# Patient Record
Sex: Male | Born: 1955
Health system: Southern US, Community
[De-identification: ages and names within clinical notes are randomized; demographics above are authoritative.]

## PROBLEM LIST (undated history)

## (undated) DIAGNOSIS — M1990 Unspecified osteoarthritis, unspecified site: Secondary | ICD-10-CM

## (undated) DIAGNOSIS — F329 Major depressive disorder, single episode, unspecified: Secondary | ICD-10-CM

## (undated) DIAGNOSIS — F4310 Post-traumatic stress disorder, unspecified: Secondary | ICD-10-CM

## (undated) DIAGNOSIS — F1910 Other psychoactive substance abuse, uncomplicated: Secondary | ICD-10-CM

## (undated) DIAGNOSIS — D649 Anemia, unspecified: Secondary | ICD-10-CM

## (undated) DIAGNOSIS — IMO0002 Reserved for concepts with insufficient information to code with codable children: Secondary | ICD-10-CM

## (undated) DIAGNOSIS — K219 Gastro-esophageal reflux disease without esophagitis: Secondary | ICD-10-CM

## (undated) DIAGNOSIS — F32A Depression, unspecified: Secondary | ICD-10-CM

## (undated) HISTORY — DX: Post-traumatic stress disorder, unspecified: F43.10

## (undated) HISTORY — PX: SPINE SURGERY: SHX786

## (undated) HISTORY — DX: Reserved for concepts with insufficient information to code with codable children: IMO0002

## (undated) HISTORY — PX: HERNIA REPAIR: SHX51

## (undated) HISTORY — DX: Depression, unspecified: F32.A

## (undated) HISTORY — DX: Unspecified osteoarthritis, unspecified site: M19.90

## (undated) HISTORY — PX: TOTAL HIP ARTHROPLASTY: SHX124

## (undated) HISTORY — DX: Major depressive disorder, single episode, unspecified: F32.9

## (undated) HISTORY — DX: Anemia, unspecified: D64.9

## (undated) HISTORY — DX: Gastro-esophageal reflux disease without esophagitis: K21.9

## (undated) HISTORY — PX: JOINT REPLACEMENT: SHX530

## (undated) HISTORY — DX: Other psychoactive substance abuse, uncomplicated: F19.10

---

## 2010-03-17 DIAGNOSIS — M5136 Other intervertebral disc degeneration, lumbar region: Secondary | ICD-10-CM | POA: Insufficient documentation

## 2011-06-08 DIAGNOSIS — Z96649 Presence of unspecified artificial hip joint: Secondary | ICD-10-CM | POA: Insufficient documentation

## 2011-11-11 DIAGNOSIS — L309 Dermatitis, unspecified: Secondary | ICD-10-CM | POA: Insufficient documentation

## 2013-12-02 DIAGNOSIS — K219 Gastro-esophageal reflux disease without esophagitis: Secondary | ICD-10-CM | POA: Insufficient documentation

## 2014-09-04 DIAGNOSIS — K315 Obstruction of duodenum: Secondary | ICD-10-CM | POA: Insufficient documentation

## 2014-09-04 DIAGNOSIS — K5790 Diverticulosis of intestine, part unspecified, without perforation or abscess without bleeding: Secondary | ICD-10-CM | POA: Insufficient documentation

## 2016-07-06 ENCOUNTER — Ambulatory Visit: Payer: Self-pay | Admitting: Family Medicine

## 2016-07-07 ENCOUNTER — Ambulatory Visit (INDEPENDENT_AMBULATORY_CARE_PROVIDER_SITE_OTHER): Payer: Self-pay | Admitting: Family Medicine

## 2016-07-07 ENCOUNTER — Encounter: Payer: Self-pay | Admitting: Family Medicine

## 2016-07-07 VITALS — BP 109/75 | HR 85 | Temp 97.9°F | Ht 72.0 in | Wt 201.0 lb

## 2016-07-07 DIAGNOSIS — K269 Duodenal ulcer, unspecified as acute or chronic, without hemorrhage or perforation: Secondary | ICD-10-CM | POA: Insufficient documentation

## 2016-07-07 DIAGNOSIS — F339 Major depressive disorder, recurrent, unspecified: Secondary | ICD-10-CM | POA: Insufficient documentation

## 2016-07-07 DIAGNOSIS — F419 Anxiety disorder, unspecified: Secondary | ICD-10-CM

## 2016-07-07 DIAGNOSIS — F329 Major depressive disorder, single episode, unspecified: Secondary | ICD-10-CM

## 2016-07-07 DIAGNOSIS — F4310 Post-traumatic stress disorder, unspecified: Secondary | ICD-10-CM | POA: Insufficient documentation

## 2016-07-07 MED ORDER — SERTRALINE HCL 100 MG PO TABS: 200.0000 mg | ORAL_TABLET | Freq: Every day | ORAL | 1 refills | Status: DC

## 2016-07-07 MED ORDER — OMEPRAZOLE 40 MG PO CPDR: 40.0000 mg | DELAYED_RELEASE_CAPSULE | Freq: Every day | ORAL | 3 refills | Status: DC

## 2016-07-07 MED ORDER — ZIPRASIDONE HCL 40 MG PO CAPS: 40.0000 mg | ORAL_CAPSULE | Freq: Two times a day (BID) | ORAL | 1 refills | Status: DC
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
## 2016-07-07 NOTE — Addendum Note (Signed)
Addended by: Arville Care on: 07/07/2016 02:52 PM   Modules accepted: Level of Service

## 2016-08-04 ENCOUNTER — Encounter: Payer: Self-pay | Admitting: Family Medicine

## 2016-08-04 ENCOUNTER — Ambulatory Visit (INDEPENDENT_AMBULATORY_CARE_PROVIDER_SITE_OTHER): Payer: Self-pay | Admitting: Family Medicine

## 2016-08-04 VITALS — BP 101/72 | HR 72 | Temp 97.8°F | Ht 72.0 in | Wt 201.0 lb

## 2016-08-04 DIAGNOSIS — F329 Major depressive disorder, single episode, unspecified: Secondary | ICD-10-CM

## 2016-08-04 DIAGNOSIS — F419 Anxiety disorder, unspecified: Secondary | ICD-10-CM

## 2016-08-04 DIAGNOSIS — F4310 Post-traumatic stress disorder, unspecified: Secondary | ICD-10-CM

## 2016-08-04 MED ORDER — ZIPRASIDONE HCL 80 MG PO CAPS: 80.0000 mg | ORAL_CAPSULE | Freq: Two times a day (BID) | ORAL | 1 refills | Status: DC

## 2016-08-04 MED ORDER — SERTRALINE HCL 100 MG PO TABS: 200.0000 mg | ORAL_TABLET | Freq: Every day | ORAL | 1 refills | Status: DC

## 2016-08-04 NOTE — Progress Notes (Signed)
BP 101/72   Pulse 72   Temp 97.8 F (36.6 C) (Oral)   Ht 6' (1.829 m)   Wt 201 lb (91.2 kg)   BMI 27.26 kg/m    Subjective:    Patient ID: Nicholas Yates, male    DOB: Feb 17, 1956, 61 y.o.   MRN: [id number]  HPI: Nicholas Yates is a 61 y.o. male presenting on 08/04/2016 for Anxiety (4 week followup)   HPI Anxiety depression and PTSD Patient is coming in for recheck on anxiety and depression and PTSD. Patient has been having a lot of anxiety still. He says the first week, Geodon it worked well and then he feels like it's tapered off for the past 3 weeks. His anxiety builds up to levels were is been having panic attacks. He says it's not necessarily any better or worse than when he was on the Risperdal at this point. He denies any side effects from the medication. He does feel like he is having a lot more movement and shakiness but he associates that with him feeling anxiety and having panic attacks. He denies any movements with his face or mouth. He denies any suicidal ideations or thoughts of hurting himself. He is anxiety has been keeping him up at night as well often.  Relevant past medical, surgical, family and social history reviewed and updated as indicated. Interim medical history since our last visit reviewed. Allergies and medications reviewed and updated.  Review of Systems  Constitutional: Negative for chills and fever.  Eyes: Negative for discharge.  Respiratory: Negative for shortness of breath and wheezing.   Cardiovascular: Negative for chest pain and leg swelling.  Musculoskeletal: Negative for back pain and gait problem.  Skin: Negative for rash.  Psychiatric/Behavioral: Positive for decreased concentration, dysphoric mood and sleep disturbance. Negative for self-injury and suicidal ideas. The patient is nervous/anxious.   All other systems reviewed and are negative.   Per HPI unless specifically indicated above        Objective:    BP 101/72   Pulse 72    Temp 97.8 F (36.6 C) (Oral)   Ht 6' (1.829 m)   Wt 201 lb (91.2 kg)   BMI 27.26 kg/m   Wt Readings from Last 3 Encounters:  08/04/16 201 lb (91.2 kg)  07/07/16 201 lb (91.2 kg)    Physical Exam  Constitutional: He is oriented to person, place, and time. He appears well-developed and well-nourished. No distress.  Eyes: Conjunctivae are normal. No scleral icterus.  Cardiovascular: Normal rate, regular rhythm, normal heart sounds and intact distal pulses.   No murmur heard. Pulmonary/Chest: Effort normal and breath sounds normal. No respiratory distress. He has no wheezes. He has no rales.  Musculoskeletal: Normal range of motion. He exhibits no edema.  Neurological: He is alert and oriented to person, place, and time. Coordination normal.  Skin: Skin is warm and dry. No rash noted. He is not diaphoretic.  Psychiatric: His behavior is normal. Judgment normal. His mood appears anxious. He exhibits a depressed mood. He expresses no suicidal ideation. He expresses no suicidal plans.  Nursing note and vitals reviewed.  No results found for this or any previous visit.    Assessment & Plan:   Problem List Items Addressed This Visit      Other   Anxiety and depression   Relevant Medications   ziprasidone (GEODON) 80 MG capsule   sertraline (ZOLOFT) 100 MG tablet   PTSD (post-traumatic stress disorder) - Primary   Relevant Medications  ziprasidone (GEODON) 80 MG capsule   sertraline (ZOLOFT) 100 MG tablet       Follow up plan: Return in about 4 weeks (around 09/01/2016), or if symptoms worsen or fail to improve, for Recheck anxiety and PTSD.  Counseling provided for all of the vaccine components No orders of the defined types were placed in this encounter.   Arville CareJoshua Pasty Manninen, MD [facility] 08/04/2016, 1:34 PM

## 2016-09-01 ENCOUNTER — Ambulatory Visit (INDEPENDENT_AMBULATORY_CARE_PROVIDER_SITE_OTHER): Payer: Self-pay | Admitting: Family Medicine

## 2016-09-01 ENCOUNTER — Encounter: Payer: Self-pay | Admitting: Family Medicine

## 2016-09-01 VITALS — BP 123/76 | HR 74 | Temp 96.9°F | Ht 72.0 in | Wt 203.0 lb

## 2016-09-01 DIAGNOSIS — F329 Major depressive disorder, single episode, unspecified: Secondary | ICD-10-CM

## 2016-09-01 DIAGNOSIS — F4310 Post-traumatic stress disorder, unspecified: Secondary | ICD-10-CM

## 2016-09-01 DIAGNOSIS — F419 Anxiety disorder, unspecified: Secondary | ICD-10-CM

## 2016-09-01 DIAGNOSIS — F32A Depression, unspecified: Secondary | ICD-10-CM

## 2016-09-01 MED ORDER — VENLAFAXINE HCL ER 150 MG PO CP24: 150.0000 mg | ORAL_CAPSULE | Freq: Every day | ORAL | 1 refills | Status: DC

## 2016-09-01 MED ORDER — HYDROXYZINE PAMOATE 25 MG PO CAPS: 25.0000 mg | ORAL_CAPSULE | Freq: Three times a day (TID) | ORAL | 0 refills | Status: DC | PRN

## 2016-09-01 NOTE — Progress Notes (Signed)
BP 123/76   Pulse 74   Temp (!) 96.9 F (36.1 C) (Oral)   Ht 6' (1.829 m)   Wt 203 lb (92.1 kg)   BMI 27.53 kg/m    Subjective:    Patient ID: Nicholas Yates, male    DOB: [birth year], 61 y.o.   MRN: [id number]  HPI: Nicholas Yates is a 61 y.o. male presenting on 09/01/2016 for Anxiety/PTSD (4 week followup; patient feels that Zoloft is not helping at all)   HPI Anxiety and PTSD recheck Patient is coming in today for anxiety and PTSD recheck. He does not feel like the Zoloft even at the higher dose is helping him. He is currently taking 200 mg of the Zoloft. He denies any major side effects from that but just says it's not helping his PTSD is especially since his living situation has been a little difficult with his current partner. He would like to try something different except the Zoloft before. He denies any suicidal ideations or thoughts of hurting himself. He says the Earnestine Leys is working well for his anxiety just does not feel like it lasts all day and he needs something to go with that. He is starting to sleep better because of the Geodon as well. He is having a lot less nightmares and flashbacks.  Relevant past medical, surgical, family and social history reviewed and updated as indicated. Interim medical history since our last visit reviewed. Allergies and medications reviewed and updated.  Review of Systems  Constitutional: Negative for chills and fever.  Eyes: Negative for discharge.  Respiratory: Negative for shortness of breath and wheezing.   Cardiovascular: Negative for chest pain and leg swelling.  Musculoskeletal: Negative for back pain and gait problem.  Skin: Negative for rash.  Psychiatric/Behavioral: Positive for dysphoric mood and sleep disturbance. Negative for self-injury and suicidal ideas. The patient is nervous/anxious.   All other systems reviewed and are negative.   Per HPI unless specifically indicated above   Allergies as of 09/01/2016   No Known  Allergies     Medication List       Accurate as of 09/01/16  4:51 PM. Always use your most recent med list.          hydrOXYzine 25 MG capsule Commonly known as:  VISTARIL Take 1 capsule (25 mg total) by mouth 3 (three) times daily as needed.   omeprazole 40 MG capsule Commonly known as:  PRILOSEC Take 1 capsule (40 mg total) by mouth daily.   venlafaxine XR 150 MG 24 hr capsule Commonly known as:  EFFEXOR XR Take 1 capsule (150 mg total) by mouth daily with breakfast.   ziprasidone 80 MG capsule Commonly known as:  GEODON Take 1 capsule (80 mg total) by mouth 2 (two) times daily with a meal.          Objective:    BP 123/76   Pulse 74   Temp (!) 96.9 F (36.1 C) (Oral)   Ht 6' (1.829 m)   Wt 203 lb (92.1 kg)   BMI 27.53 kg/m   Wt Readings from Last 3 Encounters:  09/01/16 203 lb (92.1 kg)  08/04/16 201 lb (91.2 kg)  07/07/16 201 lb (91.2 kg)    Physical Exam  Constitutional: He is oriented to person, place, and time. He appears well-developed and well-nourished. No distress.  Eyes: Conjunctivae are normal. No scleral icterus.  Neck: Neck supple. No thyromegaly present.  Cardiovascular: Normal rate, regular rhythm, normal heart sounds and intact distal  pulses.   No murmur heard. Pulmonary/Chest: Effort normal and breath sounds normal. No respiratory distress. He has no wheezes.  Musculoskeletal: Normal range of motion. He exhibits no edema.  Lymphadenopathy:    He has no cervical adenopathy.  Neurological: He is alert and oriented to person, place, and time. Coordination normal.  Skin: Skin is warm and dry. No rash noted. He is not diaphoretic.  Psychiatric: His behavior is normal. His mood appears anxious. He exhibits a depressed mood. He expresses no suicidal ideation. He expresses no suicidal plans.  Nursing note and vitals reviewed.   No results found for this or any previous visit.    Assessment & Plan:   Problem List Items Addressed This Visit        Other   Anxiety and depression - Primary   Relevant Medications   venlafaxine XR (EFFEXOR XR) 150 MG 24 hr capsule   hydrOXYzine (VISTARIL) 25 MG capsule   PTSD (post-traumatic stress disorder)   Relevant Medications   venlafaxine XR (EFFEXOR XR) 150 MG 24 hr capsule   hydrOXYzine (VISTARIL) 25 MG capsule       Follow up plan: Return if symptoms worsen or fail to improve.  Counseling provided for all of the vaccine components No orders of the defined types were placed in this encounter.   Arville CareJoshua Ladaja Yusupov, MD [facility] 09/01/2016, 4:51 PM

## 2016-09-14 ENCOUNTER — Encounter: Payer: Self-pay | Admitting: *Deleted

## 2016-09-27 ENCOUNTER — Other Ambulatory Visit: Payer: Self-pay | Admitting: Family Medicine

## 2016-09-27 DIAGNOSIS — F4310 Post-traumatic stress disorder, unspecified: Secondary | ICD-10-CM

## 2016-09-27 DIAGNOSIS — F329 Major depressive disorder, single episode, unspecified: Secondary | ICD-10-CM

## 2016-09-27 DIAGNOSIS — F419 Anxiety disorder, unspecified: Principal | ICD-10-CM

## 2016-09-28 NOTE — Telephone Encounter (Signed)
Last seen 09/01/16  Dr Dettinger

## 2016-10-03 ENCOUNTER — Ambulatory Visit: Payer: Self-pay | Admitting: Family Medicine

## 2016-10-05 ENCOUNTER — Ambulatory Visit: Payer: Self-pay | Admitting: Family Medicine

## 2016-10-07 ENCOUNTER — Encounter: Payer: Self-pay | Admitting: Family Medicine

## 2016-10-07 ENCOUNTER — Ambulatory Visit (INDEPENDENT_AMBULATORY_CARE_PROVIDER_SITE_OTHER): Payer: Self-pay | Admitting: Family Medicine

## 2016-10-07 VITALS — BP 120/79 | HR 84 | Temp 98.1°F | Ht 72.0 in | Wt 205.0 lb

## 2016-10-07 DIAGNOSIS — F329 Major depressive disorder, single episode, unspecified: Secondary | ICD-10-CM

## 2016-10-07 DIAGNOSIS — F419 Anxiety disorder, unspecified: Secondary | ICD-10-CM

## 2016-10-07 DIAGNOSIS — F32A Depression, unspecified: Secondary | ICD-10-CM

## 2016-10-07 DIAGNOSIS — F4310 Post-traumatic stress disorder, unspecified: Secondary | ICD-10-CM

## 2016-10-07 MED ORDER — ZIPRASIDONE HCL 80 MG PO CAPS: 80.0000 mg | ORAL_CAPSULE | Freq: Two times a day (BID) | ORAL | 3 refills | Status: DC

## 2016-10-07 MED ORDER — VENLAFAXINE HCL ER 150 MG PO CP24: 150.0000 mg | ORAL_CAPSULE | Freq: Every day | ORAL | 3 refills | Status: DC

## 2016-10-07 NOTE — Assessment & Plan Note (Signed)
Not doing perfect, still drinking a lot of alcohol, recommended to back off and alcohol and reevaluate in 3 months. We'll do slow taper on alcohol.

## 2016-10-07 NOTE — Progress Notes (Signed)
BP 120/79   Pulse 84   Temp 98.1 F (36.7 C) (Oral)   Ht 6' (1.829 m)   Wt 205 lb (93 kg)   BMI 27.80 kg/m    Subjective:    Patient ID: Nicholas FuchsDonald Yates, male    DOB: Jun 12, 1955, 61 y.o.   MRN: [id number]  HPI: Nicholas Yates is a 61 y.o. male presenting on 10/07/2016 for Anxiety (4 week followup)   HPI Anxiety depression and PTSD recheck Patient is coming in today for anxiety and depression and PTSD recheck. Says he is not doing well still has a lot of agitation but his anger has improved since we went up on the Effexor. He says he is still drinking about 10 beers or more a day and I'm more than that is probably impacting whether these medications work or not. He says he still has a lot of issues with PTSD but he is sleeping better at night and having less dreams. He denies any suicidal ideations or thoughts of hurting himself. [facility] 10/07/2016 09/01/2016 08/04/2016 07/07/2016  Decreased Interest 3 3 3  0  Down, Depressed, Hopeless 2 3 3 1   PHQ - 2 Score 5 6 6 1   Altered sleeping 3 3 3  -  Tired, decreased energy 3 3 3  -  Change in appetite 3 3 3  -  Feeling bad or failure about yourself  3 3 3  -  Trouble concentrating 3 3 3  -  Moving slowly or fidgety/restless 3 2 3  -  Suicidal thoughts 0 0 0 -  PHQ-9 Score 23 23 24  -  Difficult doing work/chores Extremely dIfficult Extremely dIfficult Extremely dIfficult -     Relevant past medical, surgical, family and social history reviewed and updated as indicated. Interim medical history since our last visit reviewed. Allergies and medications reviewed and updated.  Review of Systems  Constitutional: Negative for chills and fever.  Eyes: Negative for discharge.  Respiratory: Negative for shortness of breath and wheezing.   Cardiovascular: Negative for chest pain and leg swelling.  Musculoskeletal: Negative for back pain and gait problem.  Skin: Negative for rash.  Psychiatric/Behavioral: Positive for decreased  concentration and dysphoric mood. Negative for self-injury, sleep disturbance and suicidal ideas. The patient is nervous/anxious.   All other systems reviewed and are negative.   Per HPI unless specifically indicated above        Objective:    BP 120/79   Pulse 84   Temp 98.1 F (36.7 C) (Oral)   Ht 6' (1.829 m)   Wt 205 lb (93 kg)   BMI 27.80 kg/m   Wt Readings from Last 3 Encounters:  10/07/16 205 lb (93 kg)  09/01/16 203 lb (92.1 kg)  08/04/16 201 lb (91.2 kg)    Physical Exam  Constitutional: He is oriented to person, place, and time. He appears well-developed and well-nourished. No distress.  Eyes: Conjunctivae are normal. No scleral icterus.  Cardiovascular: Normal rate, regular rhythm, normal heart sounds and intact distal pulses.   No murmur heard. Pulmonary/Chest: Effort normal and breath sounds normal. No respiratory distress. He has no wheezes.  Musculoskeletal: Normal range of motion. He exhibits no edema.  Neurological: He is alert and oriented to person, place, and time. Coordination normal.  Skin: Skin is warm and dry. No rash noted. He is not diaphoretic.  Psychiatric: His behavior is normal. Judgment normal. His mood appears anxious. He exhibits a depressed mood. He expresses no suicidal ideation. He expresses no suicidal plans.  Nursing note and vitals reviewed.   No results found for this or any previous visit.    Assessment & Plan:   Problem List Items Addressed This Visit      Other   Anxiety and depression - Primary    Not doing perfect, still drinking a lot of alcohol, recommended to back off and alcohol and reevaluate in 3 months. We'll do slow taper on alcohol.      Relevant Medications   venlafaxine XR (EFFEXOR XR) 150 MG 24 hr capsule   ziprasidone (GEODON) 80 MG capsule   PTSD (post-traumatic stress disorder)   Relevant Medications   venlafaxine XR (EFFEXOR XR) 150 MG 24 hr capsule   ziprasidone (GEODON) 80 MG capsule       Follow  up plan: Return if symptoms worsen or fail to improve.  Counseling provided for all of the vaccine components No orders of the defined types were placed in this encounter.   Arville Care, MD [facility] 10/07/2016, 4:07 PM

## 2016-10-27 ENCOUNTER — Other Ambulatory Visit: Payer: Self-pay | Admitting: Family Medicine

## 2016-10-27 DIAGNOSIS — K269 Duodenal ulcer, unspecified as acute or chronic, without hemorrhage or perforation: Secondary | ICD-10-CM

## 2017-01-20 ENCOUNTER — Ambulatory Visit (INDEPENDENT_AMBULATORY_CARE_PROVIDER_SITE_OTHER): Payer: Self-pay | Admitting: Family Medicine

## 2017-01-20 ENCOUNTER — Encounter: Payer: Self-pay | Admitting: Family Medicine

## 2017-01-20 VITALS — BP 106/79 | HR 92 | Temp 98.3°F | Ht 72.0 in | Wt 214.2 lb

## 2017-01-20 DIAGNOSIS — F411 Generalized anxiety disorder: Secondary | ICD-10-CM

## 2017-01-20 DIAGNOSIS — F4310 Post-traumatic stress disorder, unspecified: Secondary | ICD-10-CM

## 2017-01-20 DIAGNOSIS — F339 Major depressive disorder, recurrent, unspecified: Secondary | ICD-10-CM

## 2017-01-20 DIAGNOSIS — K2100 Gastro-esophageal reflux disease with esophagitis, without bleeding: Secondary | ICD-10-CM

## 2017-01-20 DIAGNOSIS — F419 Anxiety disorder, unspecified: Secondary | ICD-10-CM

## 2017-01-20 DIAGNOSIS — F329 Major depressive disorder, single episode, unspecified: Secondary | ICD-10-CM

## 2017-01-20 MED ORDER — VENLAFAXINE HCL ER 150 MG PO CP24: 150.0000 mg | ORAL_CAPSULE | Freq: Every day | ORAL | 3 refills | Status: DC

## 2017-01-20 MED ORDER — BENZTROPINE MESYLATE 1 MG PO TABS: 1.0000 mg | ORAL_TABLET | Freq: Two times a day (BID) | ORAL | 2 refills | Status: DC

## 2017-01-20 NOTE — Progress Notes (Signed)
BP 106/79   Pulse 92   Temp 98.3 F (36.8 C) (Oral)   Ht 6' (1.829 m)   Wt 214 lb 3.2 oz (97.2 kg)   BMI 29.05 kg/m    Subjective:    Patient ID: Nicholas Yates, male    DOB: 03-Jun-1955, 61 y.o.   MRN: [id number]  HPI: Nicholas Yates is a 61 y.o. male presenting on 01/20/2017 for Follow-up (3 month ); Depression; and Anxiety   HPI Anxiety and depression and PTSD Patient is coming in today for a follow-up on anxiety and depression and PTSD.  He has been taking Effexor 150 mg and feels a lot better on it but not completely where he like to be.  He is also been taking Geodon and really likes the way Geodon works for him and gives him energy.  He has started to have some muscle motor tics in his leg and his mouth over the past few weeks and we discussed that this is probably from the Geodon but he feels so good on the Geodon that he does not want to changes at this time and would rather try taking the medication that could help with the symptoms and reducing them.  He used to be on Risperdal as well in the past.  He denies any suicidal ideations or thoughts of hurting himself.  He is brought in here with his friend who helps keep an eye on him.  He is still homeless and has a difficult living situation but he is feeling better about life in general.  He has been sleeping better at night as well. [facility] 01/20/2017 10/07/2016 09/01/2016 08/04/2016 07/07/2016  Decreased Interest 3 3 3 3  0  Down, Depressed, Hopeless 3 2 3 3 1   PHQ - 2 Score 6 5 6 6 1   Altered sleeping 2 3 3 3  -  Tired, decreased energy 2 3 3 3  -  Change in appetite 3 3 3 3  -  Feeling bad or failure about yourself  1 3 3 3  -  Trouble concentrating 0 3 3 3  -  Moving slowly or fidgety/restless 0 3 2 3  -  Suicidal thoughts 0 0 0 0 -  PHQ-9 Score 14 23 23 24  -  Difficult doing work/chores - Extremely dIfficult Extremely dIfficult Extremely dIfficult -     GERD Patient has a history of GERD with ulcers but has been  stable and controlled currently on his omeprazole and is very happy with where it has him.  He denies any blood in his stool or nausea or vomiting.  Relevant past medical, surgical, family and social history reviewed and updated as indicated. Interim medical history since our last visit reviewed. Allergies and medications reviewed and updated.  Review of Systems  Constitutional: Negative for chills and fever.  Eyes: Negative for discharge.  Respiratory: Negative for shortness of breath and wheezing.   Cardiovascular: Negative for chest pain and leg swelling.  Gastrointestinal: Negative for abdominal pain, blood in stool, nausea and vomiting.  Musculoskeletal: Negative for back pain and gait problem.  Skin: Negative for rash.  Psychiatric/Behavioral: Positive for decreased concentration, dysphoric mood and sleep disturbance. Negative for self-injury and suicidal ideas. The patient is nervous/anxious.   All other systems reviewed and are negative.   Per HPI unless specifically indicated above     Objective:    BP 106/79   Pulse 92   Temp 98.3 F (36.8 C) (Oral)   Ht 6' (1.829 m)  Wt 214 lb 3.2 oz (97.2 kg)   BMI 29.05 kg/m   Wt Readings from Last 3 Encounters:  01/20/17 214 lb 3.2 oz (97.2 kg)  10/07/16 205 lb (93 kg)  09/01/16 203 lb (92.1 kg)    Physical Exam  Constitutional: He is oriented to person, place, and time. He appears well-developed and well-nourished. No distress.  Eyes: Conjunctivae are normal. No scleral icterus.  Cardiovascular: Normal rate, regular rhythm, normal heart sounds and intact distal pulses.  No murmur heard. Pulmonary/Chest: Effort normal and breath sounds normal. No respiratory distress. He has no wheezes.  Abdominal: Soft. Bowel sounds are normal. He exhibits no distension. There is no tenderness. There is no rebound.  Musculoskeletal: Normal range of motion.  Neurological: He is alert and oriented to person, place, and time. Coordination  normal.  Skin: Skin is warm and dry. No rash noted. He is not diaphoretic.  Psychiatric: His behavior is normal. Judgment normal. His mood appears anxious. He exhibits a depressed mood. He expresses no suicidal ideation. He expresses no suicidal plans.  Nursing note and vitals reviewed.   No results found for this or any previous visit.    Assessment & Plan:   Problem List Items Addressed This Visit      Other   Depression, recurrent (HCC) - Primary   Relevant Medications   venlafaxine XR (EFFEXOR XR) 150 MG 24 hr capsule   benztropine (COGENTIN) 1 MG tablet   Other Relevant Orders   CBC with Differential/Platelet   PTSD (post-traumatic stress disorder)   Relevant Medications   venlafaxine XR (EFFEXOR XR) 150 MG 24 hr capsule   benztropine (COGENTIN) 1 MG tablet   Other Relevant Orders   CBC with Differential/Platelet   Generalized anxiety disorder   Relevant Medications   venlafaxine XR (EFFEXOR XR) 150 MG 24 hr capsule   benztropine (COGENTIN) 1 MG tablet   Other Relevant Orders   CBC with Differential/Platelet    Other Visit Diagnoses    Anxiety and depression       Relevant Medications   venlafaxine XR (EFFEXOR XR) 150 MG 24 hr capsule   benztropine (COGENTIN) 1 MG tablet   Gastroesophageal reflux disease with esophagitis           Follow up plan: Return in about 3 months (around 04/22/2017), or if symptoms worsen or fail to improve, for anxiety recheck.  Counseling provided for all of the vaccine components Orders Placed This Encounter  Procedures  . CBC with Differential/Platelet    Arville Care, MD [facility] 01/20/2017, 4:23 PM

## 2017-01-21 LAB — CBC WITH DIFFERENTIAL/PLATELET
BASOS ABS: 0.1 10*3/uL (ref 0.0–0.2)
Basos: 1 %
EOS (ABSOLUTE): 0.2 10*3/uL (ref 0.0–0.4)
Eos: 2 %
HEMOGLOBIN: 14 g/dL (ref 13.0–17.7)
Hematocrit: 40.9 % (ref 37.5–51.0)
Immature Grans (Abs): 0.1 10*3/uL (ref 0.0–0.1)
Immature Granulocytes: 1 %
LYMPHS ABS: 2.6 10*3/uL (ref 0.7–3.1)
Lymphs: 27 %
MCH: 32.9 pg (ref 26.6–33.0)
MCHC: 34.2 g/dL (ref 31.5–35.7)
MCV: 96 fL (ref 79–97)
MONOCYTES: 6 %
MONOS ABS: 0.6 10*3/uL (ref 0.1–0.9)
NEUTROS ABS: 6.1 10*3/uL (ref 1.4–7.0)
Neutrophils: 63 %
PLATELETS: 639 10*3/uL — AB (ref 150–379)
RBC: 4.26 x10E6/uL (ref 4.14–5.80)
RDW: 13.7 % (ref 12.3–15.4)
WBC: 9.6 10*3/uL (ref 3.4–10.8)

## 2017-01-23 ENCOUNTER — Telehealth: Payer: Self-pay | Admitting: Family Medicine

## 2017-01-23 ENCOUNTER — Other Ambulatory Visit: Payer: Self-pay

## 2017-01-23 DIAGNOSIS — F419 Anxiety disorder, unspecified: Principal | ICD-10-CM

## 2017-01-23 DIAGNOSIS — F4310 Post-traumatic stress disorder, unspecified: Secondary | ICD-10-CM

## 2017-01-23 DIAGNOSIS — F329 Major depressive disorder, single episode, unspecified: Secondary | ICD-10-CM

## 2017-01-23 DIAGNOSIS — D691 Qualitative platelet defects: Secondary | ICD-10-CM

## 2017-01-23 MED ORDER — VENLAFAXINE HCL ER 75 MG PO CP24: 225.0000 mg | ORAL_CAPSULE | Freq: Every day | ORAL | 3 refills | Status: DC

## 2017-01-23 NOTE — Telephone Encounter (Signed)
Patient wife aware

## 2017-01-23 NOTE — Telephone Encounter (Signed)
Please review and advise.

## 2017-01-25 ENCOUNTER — Other Ambulatory Visit: Payer: Self-pay | Admitting: Family Medicine

## 2017-01-25 DIAGNOSIS — K269 Duodenal ulcer, unspecified as acute or chronic, without hemorrhage or perforation: Secondary | ICD-10-CM

## 2017-02-18 ENCOUNTER — Other Ambulatory Visit: Payer: Self-pay | Admitting: Family Medicine

## 2017-02-18 DIAGNOSIS — F4310 Post-traumatic stress disorder, unspecified: Secondary | ICD-10-CM

## 2017-02-18 DIAGNOSIS — F419 Anxiety disorder, unspecified: Principal | ICD-10-CM

## 2017-02-18 DIAGNOSIS — F329 Major depressive disorder, single episode, unspecified: Secondary | ICD-10-CM

## 2017-02-20 NOTE — Telephone Encounter (Signed)
Go ahead and call in refill and give him enough to get through to next office visit

## 2017-03-16 ENCOUNTER — Ambulatory Visit (INDEPENDENT_AMBULATORY_CARE_PROVIDER_SITE_OTHER): Payer: Self-pay | Admitting: Family Medicine

## 2017-03-16 DIAGNOSIS — D691 Qualitative platelet defects: Secondary | ICD-10-CM

## 2017-03-17 LAB — CBC WITH DIFFERENTIAL/PLATELET
BASOS ABS: 0.1 10*3/uL (ref 0.0–0.2)
BASOS: 1 %
EOS (ABSOLUTE): 0.1 10*3/uL (ref 0.0–0.4)
Eos: 2 %
HEMOGLOBIN: 14.4 g/dL (ref 13.0–17.7)
Hematocrit: 41.7 % (ref 37.5–51.0)
IMMATURE GRANS (ABS): 0 10*3/uL (ref 0.0–0.1)
IMMATURE GRANULOCYTES: 0 %
LYMPHS: 28 %
Lymphocytes Absolute: 2.2 10*3/uL (ref 0.7–3.1)
MCH: 33.6 pg — AB (ref 26.6–33.0)
MCHC: 34.5 g/dL (ref 31.5–35.7)
MCV: 97 fL (ref 79–97)
MONOCYTES: 8 %
Monocytes Absolute: 0.6 10*3/uL (ref 0.1–0.9)
NEUTROS ABS: 4.7 10*3/uL (ref 1.4–7.0)
NEUTROS PCT: 61 %
Platelets: 334 10*3/uL (ref 150–379)
RBC: 4.28 x10E6/uL (ref 4.14–5.80)
RDW: 13.5 % (ref 12.3–15.4)
WBC: 7.6 10*3/uL (ref 3.4–10.8)

## 2017-03-23 LAB — PATHOLOGIST SMEAR REVIEW
BASOS ABS: 0.1 10*3/uL (ref 0.0–0.2)
Basos: 1 %
EOS (ABSOLUTE): 0.1 10*3/uL (ref 0.0–0.4)
Eos: 2 %
HEMOGLOBIN: 14.3 g/dL (ref 13.0–17.7)
Hematocrit: 43.3 % (ref 37.5–51.0)
Immature Grans (Abs): 0 10*3/uL (ref 0.0–0.1)
Immature Granulocytes: 0 %
LYMPHS ABS: 2.1 10*3/uL (ref 0.7–3.1)
Lymphs: 28 %
MCH: 32.3 pg (ref 26.6–33.0)
MCHC: 33 g/dL (ref 31.5–35.7)
MCV: 98 fL — ABNORMAL HIGH (ref 79–97)
MONOCYTES: 8 %
MONOS ABS: 0.6 10*3/uL (ref 0.1–0.9)
Neutrophils Absolute: 4.6 10*3/uL (ref 1.4–7.0)
Neutrophils: 61 %
PATH REV RBC: NORMAL
PATH REV WBC: NORMAL
PLATELETS: 366 10*3/uL (ref 150–379)
Path Rev PLTs: NORMAL
RBC: 4.43 x10E6/uL (ref 4.14–5.80)
RDW: 13.8 % (ref 12.3–15.4)
WBC: 7.4 10*3/uL (ref 3.4–10.8)

## 2017-04-10 NOTE — Progress Notes (Signed)
Erroneous encounter, lab visit only

## 2017-04-24 ENCOUNTER — Ambulatory Visit (INDEPENDENT_AMBULATORY_CARE_PROVIDER_SITE_OTHER): Payer: Self-pay | Admitting: Family Medicine

## 2017-04-24 ENCOUNTER — Ambulatory Visit: Payer: Self-pay | Admitting: Family Medicine

## 2017-04-24 ENCOUNTER — Encounter: Payer: Self-pay | Admitting: Family Medicine

## 2017-04-24 VITALS — BP 106/71 | HR 91 | Temp 98.4°F | Ht 72.0 in | Wt 212.0 lb

## 2017-04-24 DIAGNOSIS — F411 Generalized anxiety disorder: Secondary | ICD-10-CM

## 2017-04-24 DIAGNOSIS — F4310 Post-traumatic stress disorder, unspecified: Secondary | ICD-10-CM

## 2017-04-24 DIAGNOSIS — F329 Major depressive disorder, single episode, unspecified: Secondary | ICD-10-CM

## 2017-04-24 DIAGNOSIS — F339 Major depressive disorder, recurrent, unspecified: Secondary | ICD-10-CM

## 2017-04-24 DIAGNOSIS — F419 Anxiety disorder, unspecified: Secondary | ICD-10-CM

## 2017-04-24 MED ORDER — BENZTROPINE MESYLATE 2 MG PO TABS: 2.0000 mg | ORAL_TABLET | Freq: Three times a day (TID) | ORAL | 3 refills | Status: DC

## 2017-04-24 MED ORDER — ZIPRASIDONE HCL 80 MG PO CAPS: 80.0000 mg | ORAL_CAPSULE | Freq: Two times a day (BID) | ORAL | 3 refills | Status: DC

## 2017-04-24 MED ORDER — OMEPRAZOLE 40 MG PO CPDR: DELAYED_RELEASE_CAPSULE | ORAL | 1 refills | Status: DC

## 2017-04-24 MED ORDER — HYDROXYZINE PAMOATE 25 MG PO CAPS: 25.0000 mg | ORAL_CAPSULE | Freq: Three times a day (TID) | ORAL | 0 refills | Status: DC | PRN

## 2017-04-24 MED ORDER — VENLAFAXINE HCL ER 75 MG PO CP24: 225.0000 mg | ORAL_CAPSULE | Freq: Every day | ORAL | 3 refills | Status: DC

## 2017-04-24 NOTE — Progress Notes (Signed)
BP 106/71   Pulse 91   Temp 98.4 F (36.9 C) (Oral)   Ht 6' (1.829 m)   Wt 212 lb (96.2 kg)   BMI 28.75 kg/m    Subjective:    Patient ID: Nicholas Yates, male    DOB: 1955-06-11, 62 y.o.   MRN: [id number]  HPI: Nicholas Yates is a 62 y.o. male presenting on 04/24/2017 for Anxiety, depression, PTSD (3 mo)   HPI Anxiety and PTSD and depression recheck Patient is coming in for recheck of anxiety and PTSD and depression.  Patient is a self-pay patient and that is why he has not wanting to go see psychiatry or counseling.  Patient is currently on Effexor 225 and Geodon 80 twice daily.  Patient says he is doing very well on the current medication except for he is starting to have some motor tics with his right leg and his right hand which his significant other has been noticing significantly.  We had tried to place him on Cogentin to see if we can reduce these because they are likely side effects from the Geodon.  We discussed the risks versus benefits of these are likely permanent and will probably worsen if we stay on the Geodon but patient has been so severely depressed that he is choosing to stay on the medication because he is finally in a good place.  We will try and increase the Cogentin to see if it does better.  Patient denies any suicidal ideations or thoughts of hurting himself.  Relevant past medical, surgical, family and social history reviewed and updated as indicated. Interim medical history since our last visit reviewed. Allergies and medications reviewed and updated.  Review of Systems  Constitutional: Negative for chills and fever.  Respiratory: Negative for shortness of breath and wheezing.   Cardiovascular: Negative for chest pain and leg swelling.  Musculoskeletal: Negative for back pain and gait problem.  Skin: Negative for rash.  Neurological: Positive for tremors. Negative for dizziness, weakness and numbness.  Psychiatric/Behavioral: Positive for dysphoric mood.  Negative for self-injury, sleep disturbance and suicidal ideas. The patient is nervous/anxious.   All other systems reviewed and are negative.   Per HPI unless specifically indicated above   Allergies as of 04/24/2017   No Known Allergies     Medication List        Accurate as of 04/24/17  4:44 PM. Always use your most recent med list.          benztropine 1 MG tablet Commonly known as:  COGENTIN Take 1 tablet (1 mg total) by mouth 2 (two) times daily.   hydrOXYzine 25 MG capsule Commonly known as:  VISTARIL Take 1 capsule (25 mg total) by mouth 3 (three) times daily as needed.   omeprazole 40 MG capsule Commonly known as:  PRILOSEC TAKE 1 CAPSULE BY MOUTH EVERY DAY   venlafaxine XR 75 MG 24 hr capsule Commonly known as:  EFFEXOR XR Take 3 capsules (225 mg total) daily with breakfast by mouth.   ziprasidone 80 MG capsule Commonly known as:  GEODON TAKE 1 CAPSULE (80 MG TOTAL) BY MOUTH 2 (TWO) TIMES DAILY WITH A MEAL.          Objective:    BP 106/71   Pulse 91   Temp 98.4 F (36.9 C) (Oral)   Ht 6' (1.829 m)   Wt 212 lb (96.2 kg)   BMI 28.75 kg/m   Wt Readings from Last 3 Encounters:  04/24/17 212 lb (  96.2 kg)  01/20/17 214 lb 3.2 oz (97.2 kg)  10/07/16 205 lb (93 kg)    Physical Exam  Constitutional: He is oriented to person, place, and time. He appears well-developed and well-nourished. No distress.  Eyes: Conjunctivae are normal. No scleral icterus.  Neck: Neck supple. No thyromegaly present.  Cardiovascular: Normal rate, regular rhythm, normal heart sounds and intact distal pulses.  No murmur heard. Pulmonary/Chest: Effort normal and breath sounds normal. No respiratory distress. He has no wheezes. He has no rales.  Musculoskeletal: Normal range of motion. He exhibits no edema.  Lymphadenopathy:    He has no cervical adenopathy.  Neurological: He is alert and oriented to person, place, and time. Coordination normal.  Skin: Skin is warm and dry. No  rash noted. He is not diaphoretic.  Psychiatric: His behavior is normal. Judgment normal. His mood appears anxious. He exhibits a depressed mood. He expresses no suicidal ideation. He expresses no suicidal plans.  Nursing note and vitals reviewed.       Assessment & Plan:   Problem List Items Addressed This Visit      Other   Depression, recurrent (HCC)   Relevant Medications   benztropine (COGENTIN) 2 MG tablet   hydrOXYzine (VISTARIL) 25 MG capsule   venlafaxine XR (EFFEXOR-XR) 75 MG 24 hr capsule   ziprasidone (GEODON) 80 MG capsule   PTSD (post-traumatic stress disorder)   Relevant Medications   benztropine (COGENTIN) 2 MG tablet   hydrOXYzine (VISTARIL) 25 MG capsule   venlafaxine XR (EFFEXOR-XR) 75 MG 24 hr capsule   ziprasidone (GEODON) 80 MG capsule   Generalized anxiety disorder - Primary   Relevant Medications   benztropine (COGENTIN) 2 MG tablet   hydrOXYzine (VISTARIL) 25 MG capsule   omeprazole (PRILOSEC) 40 MG capsule   venlafaxine XR (EFFEXOR-XR) 75 MG 24 hr capsule    Other Visit Diagnoses    Anxiety and depression       Relevant Medications   benztropine (COGENTIN) 2 MG tablet   hydrOXYzine (VISTARIL) 25 MG capsule   venlafaxine XR (EFFEXOR-XR) 75 MG 24 hr capsule   ziprasidone (GEODON) 80 MG capsule       Follow up plan: Return in about 3 months (around 07/22/2017), or if symptoms worsen or fail to improve, for Recheck anxiety and depression.  Counseling provided for all of the vaccine components No orders of the defined types were placed in this encounter.   Arville Care, MD [facility] 04/24/2017, 4:44 PM

## 2017-04-26 ENCOUNTER — Other Ambulatory Visit: Payer: Self-pay | Admitting: Family Medicine

## 2017-04-26 DIAGNOSIS — F4310 Post-traumatic stress disorder, unspecified: Secondary | ICD-10-CM

## 2017-04-26 DIAGNOSIS — F419 Anxiety disorder, unspecified: Secondary | ICD-10-CM

## 2017-04-26 DIAGNOSIS — F329 Major depressive disorder, single episode, unspecified: Secondary | ICD-10-CM

## 2017-04-26 DIAGNOSIS — F339 Major depressive disorder, recurrent, unspecified: Secondary | ICD-10-CM

## 2017-04-26 DIAGNOSIS — F411 Generalized anxiety disorder: Secondary | ICD-10-CM

## 2017-05-28 ENCOUNTER — Other Ambulatory Visit: Payer: Self-pay | Admitting: Family Medicine

## 2017-05-28 DIAGNOSIS — F4310 Post-traumatic stress disorder, unspecified: Secondary | ICD-10-CM

## 2017-05-28 DIAGNOSIS — F419 Anxiety disorder, unspecified: Principal | ICD-10-CM

## 2017-05-28 DIAGNOSIS — F329 Major depressive disorder, single episode, unspecified: Secondary | ICD-10-CM

## 2017-06-29 ENCOUNTER — Ambulatory Visit (INDEPENDENT_AMBULATORY_CARE_PROVIDER_SITE_OTHER): Payer: Self-pay | Admitting: Family Medicine

## 2017-06-29 ENCOUNTER — Encounter: Payer: Self-pay | Admitting: Family Medicine

## 2017-06-29 VITALS — BP 105/73 | HR 68 | Temp 96.8°F | Ht 72.0 in | Wt 204.0 lb

## 2017-06-29 DIAGNOSIS — F4310 Post-traumatic stress disorder, unspecified: Secondary | ICD-10-CM

## 2017-06-29 DIAGNOSIS — F411 Generalized anxiety disorder: Secondary | ICD-10-CM

## 2017-06-29 DIAGNOSIS — F339 Major depressive disorder, recurrent, unspecified: Secondary | ICD-10-CM

## 2017-06-29 DIAGNOSIS — B351 Tinea unguium: Secondary | ICD-10-CM

## 2017-06-29 MED ORDER — TERBINAFINE HCL 250 MG PO TABS: 250.0000 mg | ORAL_TABLET | Freq: Every day | ORAL | 1 refills | Status: DC

## 2017-06-29 NOTE — Progress Notes (Signed)
BP 105/73   Pulse 68   Temp (!) 96.8 F (36 C) (Oral)   Ht 6' (1.829 m)   Wt 204 lb (92.5 kg)   BMI 27.67 kg/m    Subjective:    Patient ID: Nicholas Yates, male    DOB: 06-May-1955, 62 y.o.   MRN: [id number]  HPI: Khaled Herda is a 62 y.o. male presenting on 06/29/2017 for Depression (follow up)   HPI Depression recheck Patient is coming in for depression recheck today.  He is still having the motor tics that he has been having because of the medication but they are lessened and more controllable now than what they have been and he is happy with where his medication is for his mood.  He does not want to change anything and he knows that the likelihood is the takes are permanent and could get worse if he stays on the medication.  He denies any suicidal ideations or thoughts of hurting himself.  He has been going through some recent stressors but those he feels like will soon be over and does not want to change anything currently. Depression screen [facility] 2/9 06/29/2017 04/24/2017 01/20/2017 10/07/2016 09/01/2016  Decreased Interest 3 2 3 3 3   Down, Depressed, Hopeless 2 2 3 2 3   PHQ - 2 Score 5 4 6 5 6   Altered sleeping 2 3 2 3 3   Tired, decreased energy 2 2 2 3 3   Change in appetite 2 3 3 3 3   Feeling bad or failure about yourself  2 3 1 3 3   Trouble concentrating 2 1 0 3 3  Moving slowly or fidgety/restless 2 0 0 3 2  Suicidal thoughts 0 0 0 0 0  PHQ-9 Score 17 16 14 23 23   Difficult doing work/chores - - - Extremely dIfficult Extremely dIfficult    Patient does have thickened and yellowed toenails and wants to go on treatment like his significant other has help with her toenails.  He feels like he is left great toe is going to fall off soon because of the way it has been  Relevant past medical, surgical, family and social history reviewed and updated as indicated. Interim medical history since our last visit reviewed. Allergies and medications reviewed and updated.  Review of Systems   Constitutional: Negative for chills and fever.  Respiratory: Negative for shortness of breath and wheezing.   Cardiovascular: Negative for chest pain and leg swelling.  Musculoskeletal: Negative for back pain and gait problem.  Skin: Negative for rash.  Psychiatric/Behavioral: Positive for dysphoric mood. Negative for self-injury, sleep disturbance and suicidal ideas. The patient is nervous/anxious.   All other systems reviewed and are negative.   Per HPI unless specifically indicated above   Allergies as of 06/29/2017   No Known Allergies     Medication List        Accurate as of 06/29/17  8:48 AM. Always use your most recent med list.          benztropine 2 MG tablet Commonly known as:  COGENTIN Take 1 tablet (2 mg total) by mouth 3 (three) times daily.   benztropine 1 MG tablet Commonly known as:  COGENTIN TAKE 1 TABLET BY MOUTH TWICE A DAY   hydrOXYzine 25 MG capsule Commonly known as:  VISTARIL Take 1 capsule (25 mg total) by mouth 3 (three) times daily as needed.   omeprazole 40 MG capsule Commonly known as:  PRILOSEC TAKE 1 CAPSULE BY MOUTH EVERY DAY  terbinafine 250 MG tablet Commonly known as:  LAMISIL Take 1 tablet (250 mg total) by mouth daily.   venlafaxine XR 75 MG 24 hr capsule Commonly known as:  EFFEXOR-XR Take 3 capsules (225 mg total) by mouth daily with breakfast.   ziprasidone 80 MG capsule Commonly known as:  GEODON Take 1 capsule (80 mg total) by mouth 2 (two) times daily with a meal.          Objective:    BP 105/73   Pulse 68   Temp (!) 96.8 F (36 C) (Oral)   Ht 6' (1.829 m)   Wt 204 lb (92.5 kg)   BMI 27.67 kg/m   Wt Readings from Last 3 Encounters:  06/29/17 204 lb (92.5 kg)  04/24/17 212 lb (96.2 kg)  01/20/17 214 lb 3.2 oz (97.2 kg)    Physical Exam  Constitutional: He is oriented to person, place, and time. He appears well-developed and well-nourished. No distress.  Eyes: Conjunctivae are normal. No scleral  icterus.  Neck: Neck supple. No thyromegaly present.  Cardiovascular: Normal rate, regular rhythm, normal heart sounds and intact distal pulses.  No murmur heard. Pulmonary/Chest: Effort normal and breath sounds normal. No respiratory distress. He has no wheezes.  Musculoskeletal: Normal range of motion. He exhibits no edema.  Lymphadenopathy:    He has no cervical adenopathy.  Neurological: He is alert and oriented to person, place, and time. Coordination normal.  Skin: Skin is warm and dry. No rash noted. He is not diaphoretic.  Thickened and yellow and brittle toenails on both of his great toes  Psychiatric: His behavior is normal. His mood appears anxious. He exhibits a depressed mood. He expresses no suicidal ideation. He expresses no suicidal plans.  Nursing note and vitals reviewed.     Assessment & Plan:   Problem List Items Addressed This Visit      Other   Depression, recurrent (HCC)   PTSD (post-traumatic stress disorder)   Generalized anxiety disorder - Primary    Other Visit Diagnoses    Onychomycosis       Relevant Medications   terbinafine (LAMISIL) 250 MG tablet   Other Relevant Orders   Ambulatory referral to Podiatry     Continue current medication and if anything changes for the worse return and let us know.  Follow up plan: Return if symptoms worsen or fail to improve.  Counseling provided for all of the vaccine components Orders Placed This Encounter  Procedures  . Ambulatory referral to Podiatry    Arville CareJoshua Kimiyah Blick, MD [facility] 06/29/2017, 8:48 AM

## 2017-07-07 ENCOUNTER — Other Ambulatory Visit: Payer: Self-pay | Admitting: Family Medicine

## 2017-07-07 DIAGNOSIS — F329 Major depressive disorder, single episode, unspecified: Secondary | ICD-10-CM

## 2017-07-07 DIAGNOSIS — F419 Anxiety disorder, unspecified: Principal | ICD-10-CM

## 2017-07-07 DIAGNOSIS — F32A Depression, unspecified: Secondary | ICD-10-CM

## 2017-07-07 DIAGNOSIS — F4310 Post-traumatic stress disorder, unspecified: Secondary | ICD-10-CM

## 2017-10-02 ENCOUNTER — Ambulatory Visit (INDEPENDENT_AMBULATORY_CARE_PROVIDER_SITE_OTHER): Payer: Self-pay | Admitting: Family Medicine

## 2017-10-02 ENCOUNTER — Encounter: Payer: Self-pay | Admitting: Family Medicine

## 2017-10-02 VITALS — BP 123/83 | HR 84 | Temp 97.9°F | Ht 72.0 in | Wt 198.0 lb

## 2017-10-02 DIAGNOSIS — F4310 Post-traumatic stress disorder, unspecified: Secondary | ICD-10-CM

## 2017-10-02 DIAGNOSIS — F339 Major depressive disorder, recurrent, unspecified: Secondary | ICD-10-CM

## 2017-10-02 DIAGNOSIS — L739 Follicular disorder, unspecified: Secondary | ICD-10-CM

## 2017-10-02 DIAGNOSIS — F411 Generalized anxiety disorder: Secondary | ICD-10-CM

## 2017-10-02 MED ORDER — SULFAMETHOXAZOLE-TRIMETHOPRIM 800-160 MG PO TABS: 1.0000 | ORAL_TABLET | Freq: Two times a day (BID) | ORAL | 0 refills | Status: DC

## 2017-10-02 NOTE — Progress Notes (Signed)
BP 123/83   Pulse 84   Temp 97.9 F (36.6 C) (Oral)   Ht 6' (1.829 m)   Wt 198 lb (89.8 kg)   BMI 26.85 kg/m    Subjective:    Patient ID: Nicholas Yates, male    DOB: [birth year], 62 y.o.   MRN: [id number]  HPI: Nicholas Yates is a 62 y.o. male presenting on 10/02/2017 for Depression   HPI PTSD and anxiety depression Patient is coming in for recheck of PTSD and anxiety and depression.  This has been something that has been bothering him for some years but is been worse over the past few months.  Patient would like to go see psychiatry because of the struggles we have had with medications and trying to find right medications for him.  Patient is self-pay so that it was part of the issues with why he had not gone to see psychiatry prior to this.  He denies any suicidal ideations currently but he just says his medication is not doing as well for him again. [facility] 10/02/2017 06/29/2017 04/24/2017 01/20/2017 10/07/2016  Decreased Interest 3 3 2 3 3   Down, Depressed, Hopeless 2 2 2 3 2   PHQ - 2 Score 5 5 4 6 5   Altered sleeping 3 2 3 2 3   Tired, decreased energy 3 2 2 2 3   Change in appetite 3 2 3 3 3   Feeling bad or failure about yourself  2 2 3 1 3   Trouble concentrating 3 2 1  0 3  Moving slowly or fidgety/restless 0 2 0 0 3  Suicidal thoughts 0 0 0 0 0  PHQ-9 Score 19 17 16 14 23   Difficult doing work/chores - - - - Extremely dIfficult    Scalp rash  patient has some small bumps that have developed in his scalp especially on the back of his head that have led to a larger scabs because of picking. He says there has been some drainage out of it but denies any fevers or chills or any of them anywhere else besides in his scalp.  Patient is currently on an antifungal medication for his feet and is still taking that every day.  He says it has been there for 1 or 2 weeks.  Relevant past medical, surgical, family and social history reviewed and updated as indicated. Interim  medical history since our last visit reviewed. Allergies and medications reviewed and updated.  Review of Systems  Constitutional: Negative for chills and fever.  Respiratory: Negative for shortness of breath and wheezing.   Cardiovascular: Negative for chest pain and leg swelling.  Musculoskeletal: Negative for back pain and gait problem.  Skin: Positive for rash.  Psychiatric/Behavioral: Positive for decreased concentration, dysphoric mood and sleep disturbance. Negative for self-injury and suicidal ideas. The patient is nervous/anxious.   All other systems reviewed and are negative.   Per HPI unless specifically indicated above   Allergies as of 10/02/2017   No Known Allergies     Medication List        Accurate as of 10/02/17  9:10 AM. Always use your most recent med list.          benztropine 1 MG tablet Commonly known as:  COGENTIN TAKE 1 TABLET BY MOUTH TWICE A DAY   hydrOXYzine 25 MG capsule Commonly known as:  VISTARIL Take 1 capsule (25 mg total) by mouth 3 (three) times daily as needed.   omeprazole 40 MG capsule Commonly known as:  PRILOSEC TAKE 1 CAPSULE BY MOUTH EVERY DAY   terbinafine 250 MG tablet Commonly known as:  LAMISIL Take 1 tablet (250 mg total) by mouth daily.   venlafaxine XR 75 MG 24 hr capsule Commonly known as:  EFFEXOR-XR Take 3 capsules (225 mg total) by mouth daily with breakfast.   ziprasidone 80 MG capsule Commonly known as:  GEODON Take 1 capsule (80 mg total) by mouth 2 (two) times daily with a meal.          Objective:    BP 123/83   Pulse 84   Temp 97.9 F (36.6 C) (Oral)   Ht 6' (1.829 m)   Wt 198 lb (89.8 kg)   BMI 26.85 kg/m   Wt Readings from Last 3 Encounters:  10/02/17 198 lb (89.8 kg)  06/29/17 204 lb (92.5 kg)  04/24/17 212 lb (96.2 kg)    Physical Exam  Constitutional: He is oriented to person, place, and time. He appears well-developed and well-nourished. No distress.  Eyes: Conjunctivae are normal.  No scleral icterus.  Neck: Neck supple. No thyromegaly present.  Cardiovascular: Normal rate, regular rhythm, normal heart sounds and intact distal pulses.  No murmur heard. Pulmonary/Chest: Effort normal and breath sounds normal. No respiratory distress. He has no wheezes.  Musculoskeletal: Normal range of motion. He exhibits no edema.  Lymphadenopathy:    He has no cervical adenopathy.  Neurological: He is alert and oriented to person, place, and time. Coordination normal.  Skin: Skin is warm and dry. Rash noted. Rash is papular (Large papular rash with excoriations, about 6 or 7 lesions on the back of his scalp and right side of the scalp.  Small amount of induration but no fluctuation on each of them.). He is not diaphoretic.  Psychiatric: His behavior is normal. His mood appears anxious. He exhibits a depressed mood. He expresses no suicidal ideation. He expresses no suicidal plans.  Nursing note and vitals reviewed.       Assessment & Plan:   Problem List Items Addressed This Visit      Other   Depression, recurrent (HCC)   Relevant Orders   Ambulatory referral to Psychiatry   PTSD (post-traumatic stress disorder) - Primary   Relevant Orders   Ambulatory referral to Psychiatry   Generalized anxiety disorder   Relevant Orders   Ambulatory referral to Psychiatry    Other Visit Diagnoses    Folliculitis       Spots of folliculitis on scalp that have led to larger scabs because of picking.   Relevant Medications   sulfamethoxazole-trimethoprim (BACTRIM DS,SEPTRA DS) 800-160 MG tablet      Having more side effects from medication but mood is doing okay, memory issues and still has some repetitive motions but have improved slightly.  Patient is getting Medicaid finally so would like to do referral to psychiatry because they can finally afford it.  Follow up plan: Return in about 3 months (around 01/02/2018), or if symptoms worsen or fail to improve, for PTSD anxiety and  depression recheck.  Counseling provided for all of the vaccine components Orders Placed This Encounter  Procedures  . Ambulatory referral to Psychiatry    Arville Care, MD [facility] 10/02/2017, 9:10 AM

## 2017-10-30 ENCOUNTER — Other Ambulatory Visit: Payer: Self-pay | Admitting: Family Medicine

## 2017-10-30 DIAGNOSIS — F329 Major depressive disorder, single episode, unspecified: Secondary | ICD-10-CM

## 2017-10-30 DIAGNOSIS — F419 Anxiety disorder, unspecified: Principal | ICD-10-CM

## 2017-10-30 DIAGNOSIS — F4310 Post-traumatic stress disorder, unspecified: Secondary | ICD-10-CM

## 2017-10-30 NOTE — Telephone Encounter (Signed)
Last seen 10/02/17

## 2017-11-23 ENCOUNTER — Other Ambulatory Visit: Payer: Self-pay | Admitting: Family Medicine

## 2017-11-23 DIAGNOSIS — F329 Major depressive disorder, single episode, unspecified: Secondary | ICD-10-CM

## 2017-11-23 DIAGNOSIS — F32A Depression, unspecified: Secondary | ICD-10-CM

## 2017-11-23 DIAGNOSIS — F411 Generalized anxiety disorder: Secondary | ICD-10-CM

## 2017-11-23 DIAGNOSIS — F339 Major depressive disorder, recurrent, unspecified: Secondary | ICD-10-CM

## 2017-11-23 DIAGNOSIS — F4310 Post-traumatic stress disorder, unspecified: Secondary | ICD-10-CM

## 2017-11-23 DIAGNOSIS — F419 Anxiety disorder, unspecified: Principal | ICD-10-CM

## 2017-11-27 NOTE — Progress Notes (Deleted)
Psychiatric Initial Adult Assessment   Patient Identification: Nicholas Yates MRN:  [id number] Date of Evaluation:  11/27/2017 Referral Source: Dettinger, Elige Radon, MD Chief Complaint:   Visit Diagnosis: No diagnosis found.  History of Present Illness:   Nicholas Yates is a 62 y.o. year old male with a history of depression, PTDS, who is referred for PTSD.     Associated Signs/Symptoms: Depression Symptoms:  {DEPRESSION SYMPTOMS:20000} (Hypo) Manic Symptoms:  {BHH MANIC SYMPTOMS:22872} Anxiety Symptoms:  {BHH ANXIETY SYMPTOMS:22873} Psychotic Symptoms:  {BHH PSYCHOTIC SYMPTOMS:22874} PTSD Symptoms: {BHH PTSD SYMPTOMS:22875}  Past Psychiatric History:  Outpatient:  Psychiatry admission:  Previous suicide attempt:  Past trials of medication:  History of violence:   Previous Psychotropic Medications: {YES/NO:21197}  Substance Abuse History in the last 12 months:  {yes no:314532}  Consequences of Substance Abuse: {BHH CONSEQUENCES OF SUBSTANCE ABUSE:22880}  Past Medical History:  Past Medical History:  Diagnosis Date  . Depression   . GERD (gastroesophageal reflux disease)    had duodenal ulcer 5 months  . Post traumatic stress disorder     Past Surgical History:  Procedure Laterality Date  . HERNIA REPAIR    . TOTAL HIP ARTHROPLASTY     bilateral    Family Psychiatric History: ***  Family History: No family history on file.  Social History:   Social History   Socioeconomic History  . Marital status: Single    Spouse name: Not on file  . Number of children: Not on file  . Years of education: Not on file  . Highest education level: Not on file  Occupational History  . Not on file  Social Needs  . Financial resource strain: Not on file  . Food insecurity:    Worry: Not on file    Inability: Not on file  . Transportation needs:    Medical: Not on file    Non-medical: Not on file  Tobacco Use  . Smoking status: Never Smoker  . Smokeless tobacco: Never  Used  Substance and Sexual Activity  . Alcohol use: Yes    Alcohol/week: 42.0 standard drinks    Types: 42 Cans of beer per week  . Drug use: No  . Sexual activity: Yes  Lifestyle  . Physical activity:    Days per week: Not on file    Minutes per session: Not on file  . Stress: Not on file  Relationships  . Social connections:    Talks on phone: Not on file    Gets together: Not on file    Attends religious service: Not on file    Active member of club or organization: Not on file    Attends meetings of clubs or organizations: Not on file    Relationship status: Not on file  Other Topics Concern  . Not on file  Social History Narrative  . Not on file    Additional Social History: ***  Allergies:  No Known Allergies  Metabolic Disorder Labs: No results found for: HGBA1C, MPG No results found for: PROLACTIN No results found for: CHOL, TRIG, HDL, CHOLHDL, VLDL, LDLCALC   Current Medications: Current Outpatient Medications  Medication Sig Dispense Refill  . benztropine (COGENTIN) 1 MG tablet TAKE 1 TABLET BY MOUTH TWICE A DAY 60 tablet 2  . hydrOXYzine (VISTARIL) 25 MG capsule Take 1 capsule (25 mg total) by mouth 3 (three) times daily as needed. 30 capsule 2  . omeprazole (PRILOSEC) 40 MG capsule TAKE 1 CAPSULE BY MOUTH EVERY DAY 90 capsule 0  .  sulfamethoxazole-trimethoprim (BACTRIM DS,SEPTRA DS) 800-160 MG tablet Take 1 tablet by mouth 2 (two) times daily. 20 tablet 0  . terbinafine (LAMISIL) 250 MG tablet Take 1 tablet (250 mg total) by mouth daily. 90 tablet 1  . venlafaxine XR (EFFEXOR-XR) 75 MG 24 hr capsule Take 3 capsules (225 mg total) by mouth daily with breakfast. 90 capsule 0  . ziprasidone (GEODON) 80 MG capsule Take 1 capsule (80 mg total) by mouth 2 (two) times daily with a meal. 60 capsule 2   No current facility-administered medications for this visit.     Neurologic: Headache: No Seizure: No Paresthesias:No  Musculoskeletal: Strength & Muscle  Tone: within normal limits Gait & Station: normal Patient leans: N/A  Psychiatric Specialty Exam: ROS  There were no vitals taken for this visit.There is no height or weight on file to calculate BMI.  General Appearance: Fairly Groomed  Eye Contact:  Good  Speech:  Clear and Coherent  Volume:  Normal  Mood:  {BHH MOOD:22306}  Affect:  {Affect (PAA):22687}  Thought Process:  Coherent  Orientation:  Full (Time, Place, and Person)  Thought Content:  Logical  Suicidal Thoughts:  {ST/HT (PAA):22692}  Homicidal Thoughts:  {ST/HT (PAA):22692}  Memory:  Immediate;   Good  Judgement:  {Judgement (PAA):22694}  Insight:  {Insight (PAA):22695}  Psychomotor Activity:  Normal  Concentration:  Concentration: Good and Attention Span: Good  Recall:  Good  Fund of Knowledge:Good  Language: Good  Akathisia:  No  Handed:  Right  AIMS (if indicated):  N/A  Assets:  Communication Skills Desire for Improvement  ADL's:  Intact  Cognition: WNL  Sleep:  ***   Assessment  Plan  The patient demonstrates the following risk factors for suicide: Chronic risk factors for suicide include: {Chronic Risk Factors for ZOXWRUE:45409811}. Acute risk factors for suicide include: {Acute Risk Factors for BJYNWGN:56213086}. Protective factors for this patient include: {Protective Factors for Suicide VHQI:69629528}. Considering these factors, the overall suicide risk at this point appears to be {Desc; low/moderate/high:110033}. Patient {ACTION; IS/IS UXL:24401027} appropriate for outpatient follow up.   Treatment Plan Summary: Plan as above   Neysa Hotter, MD 9/9/20199:23 AM

## 2017-11-30 ENCOUNTER — Ambulatory Visit (HOSPITAL_COMMUNITY): Payer: Self-pay | Admitting: Psychiatry

## 2017-12-14 ENCOUNTER — Encounter: Payer: Self-pay | Admitting: *Deleted

## 2017-12-27 ENCOUNTER — Other Ambulatory Visit: Payer: Self-pay | Admitting: Family Medicine

## 2017-12-27 DIAGNOSIS — F4310 Post-traumatic stress disorder, unspecified: Secondary | ICD-10-CM

## 2017-12-27 DIAGNOSIS — F419 Anxiety disorder, unspecified: Principal | ICD-10-CM

## 2017-12-27 DIAGNOSIS — F329 Major depressive disorder, single episode, unspecified: Secondary | ICD-10-CM

## 2018-01-04 ENCOUNTER — Ambulatory Visit: Payer: Self-pay | Admitting: Family Medicine

## 2018-01-05 ENCOUNTER — Ambulatory Visit: Payer: Self-pay | Admitting: Family Medicine

## 2018-01-05 ENCOUNTER — Encounter: Payer: Self-pay | Admitting: Family Medicine

## 2018-01-05 VITALS — BP 121/71 | HR 76 | Temp 97.4°F | Ht 72.0 in | Wt 197.0 lb

## 2018-01-05 DIAGNOSIS — B351 Tinea unguium: Secondary | ICD-10-CM

## 2018-01-05 DIAGNOSIS — F411 Generalized anxiety disorder: Secondary | ICD-10-CM

## 2018-01-05 DIAGNOSIS — F329 Major depressive disorder, single episode, unspecified: Secondary | ICD-10-CM

## 2018-01-05 DIAGNOSIS — F419 Anxiety disorder, unspecified: Secondary | ICD-10-CM

## 2018-01-05 DIAGNOSIS — F339 Major depressive disorder, recurrent, unspecified: Secondary | ICD-10-CM

## 2018-01-05 DIAGNOSIS — F4310 Post-traumatic stress disorder, unspecified: Secondary | ICD-10-CM

## 2018-01-05 MED ORDER — VENLAFAXINE HCL ER 75 MG PO CP24: 225.0000 mg | ORAL_CAPSULE | Freq: Every day | ORAL | 11 refills | Status: DC

## 2018-01-05 MED ORDER — ZIPRASIDONE HCL 80 MG PO CAPS: 80.0000 mg | ORAL_CAPSULE | Freq: Two times a day (BID) | ORAL | 11 refills | Status: DC

## 2018-01-05 MED ORDER — TERBINAFINE HCL 250 MG PO TABS: 250.0000 mg | ORAL_TABLET | Freq: Every day | ORAL | 1 refills | Status: DC

## 2018-01-05 MED ORDER — OMEPRAZOLE 40 MG PO CPDR: 40.0000 mg | DELAYED_RELEASE_CAPSULE | Freq: Every day | ORAL | 3 refills | Status: DC

## 2018-01-05 MED ORDER — BENZTROPINE MESYLATE 1 MG PO TABS: 1.0000 mg | ORAL_TABLET | Freq: Two times a day (BID) | ORAL | 3 refills | Status: DC

## 2018-01-05 NOTE — Progress Notes (Signed)
BP 121/71   Pulse 76   Temp (!) 97.4 F (36.3 C) (Oral)   Ht 6' (1.829 m)   Wt 197 lb (89.4 kg)   BMI 26.72 kg/m    Subjective:    Patient ID: Nicholas Yates, male    DOB: 10-13-1955, 62 y.o.   MRN: [id number]  HPI: Nicholas Yates is a 62 y.o. male presenting on 01/05/2018 for Medical Management of Chronic Issues   HPI Anxiety and Depression recheck Patient is coming in for anxiety depression recheck.  He says he is feeling a lot better than he has been motor tics have greatly reduced.  He says he is working a job for the first time in many years and he has reduced his alcohol intake to almost nothing and he is feeling so much better than what he has in a very long time.  Patient denies any suicidal ideations or thoughts of hurting himself. Depression screen [facility] 2/9 10/02/2017 06/29/2017 04/24/2017 01/20/2017 10/07/2016  Decreased Interest 3 3 2 3 3   Down, Depressed, Hopeless 2 2 2 3 2   PHQ - 2 Score 5 5 4 6 5   Altered sleeping 3 2 3 2 3   Tired, decreased energy 3 2 2 2 3   Change in appetite 3 2 3 3 3   Feeling bad or failure about yourself  2 2 3 1 3   Trouble concentrating 3 2 1  0 3  Moving slowly or fidgety/restless 0 2 0 0 3  Suicidal thoughts 0 0 0 0 0  PHQ-9 Score 19 17 16 14 23   Difficult doing work/chores - - - - Extremely dIfficult     Relevant past medical, surgical, family and social history reviewed and updated as indicated. Interim medical history since our last visit reviewed. Allergies and medications reviewed and updated.  Review of Systems  Constitutional: Negative for chills and fever.  Respiratory: Negative for shortness of breath and wheezing.   Cardiovascular: Negative for chest pain and leg swelling.  Musculoskeletal: Negative for back pain and gait problem.  Skin: Negative for rash.  Neurological: Positive for tremors.  Psychiatric/Behavioral: Positive for decreased concentration and dysphoric mood. Negative for self-injury, sleep disturbance and suicidal  ideas. The patient is nervous/anxious.   All other systems reviewed and are negative.   Per HPI unless specifically indicated above   Allergies as of 01/05/2018   No Known Allergies     Medication List        Accurate as of 01/05/18  2:26 PM. Always use your most recent med list.          benztropine 1 MG tablet Commonly known as:  COGENTIN Take 1 tablet (1 mg total) by mouth 2 (two) times daily.   hydrOXYzine 25 MG capsule Commonly known as:  VISTARIL Take 1 capsule (25 mg total) by mouth 3 (three) times daily as needed.   omeprazole 40 MG capsule Commonly known as:  PRILOSEC Take 1 capsule (40 mg total) by mouth daily.   sulfamethoxazole-trimethoprim 800-160 MG tablet Commonly known as:  BACTRIM DS,SEPTRA DS Take 1 tablet by mouth 2 (two) times daily.   terbinafine 250 MG tablet Commonly known as:  LAMISIL Take 1 tablet (250 mg total) by mouth daily.   venlafaxine XR 75 MG 24 hr capsule Commonly known as:  EFFEXOR-XR Take 3 capsules (225 mg total) by mouth daily with breakfast.   ziprasidone 80 MG capsule Commonly known as:  GEODON Take 1 capsule (80 mg total) by mouth 2 (two) times  daily with a meal.          Objective:    BP 121/71   Pulse 76   Temp (!) 97.4 F (36.3 C) (Oral)   Ht 6' (1.829 m)   Wt 197 lb (89.4 kg)   BMI 26.72 kg/m   Wt Readings from Last 3 Encounters:  01/05/18 197 lb (89.4 kg)  10/02/17 198 lb (89.8 kg)  06/29/17 204 lb (92.5 kg)    Physical Exam  Constitutional: He is oriented to person, place, and time. He appears well-developed and well-nourished. No distress.  Eyes: Conjunctivae are normal. No scleral icterus.  Neck: Neck supple. No thyromegaly present.  Cardiovascular: Normal rate, regular rhythm, normal heart sounds and intact distal pulses.  No murmur heard. Pulmonary/Chest: Effort normal and breath sounds normal. No respiratory distress. He has no wheezes.  Lymphadenopathy:    He has no cervical adenopathy.    Neurological: He is alert and oriented to person, place, and time. He displays tremor (Tremor in foot). Coordination normal.  Skin: Skin is warm and dry. No rash noted. He is not diaphoretic.  Psychiatric: He has a normal mood and affect. His behavior is normal.  Nursing note and vitals reviewed.      Assessment & Plan:   Problem List Items Addressed This Visit      Other   Depression, recurrent (HCC)   Relevant Medications   venlafaxine XR (EFFEXOR-XR) 75 MG 24 hr capsule   ziprasidone (GEODON) 80 MG capsule   benztropine (COGENTIN) 1 MG tablet   PTSD (post-traumatic stress disorder)   Relevant Medications   venlafaxine XR (EFFEXOR-XR) 75 MG 24 hr capsule   ziprasidone (GEODON) 80 MG capsule   benztropine (COGENTIN) 1 MG tablet   Generalized anxiety disorder - Primary   Relevant Medications   venlafaxine XR (EFFEXOR-XR) 75 MG 24 hr capsule   benztropine (COGENTIN) 1 MG tablet   omeprazole (PRILOSEC) 40 MG capsule    Other Visit Diagnoses    Anxiety and depression       Relevant Medications   venlafaxine XR (EFFEXOR-XR) 75 MG 24 hr capsule   ziprasidone (GEODON) 80 MG capsule   benztropine (COGENTIN) 1 MG tablet   Onychomycosis       Relevant Medications   terbinafine (LAMISIL) 250 MG tablet    Patient says he is doing very well on his medication and continue with current medication.  Follow up plan: Return in about 6 months (around 07/07/2018), or if symptoms worsen or fail to improve, for anxiety.  Counseling provided for all of the vaccine components No orders of the defined types were placed in this encounter.   Arville Care, MD [facility] 01/05/2018, 2:26 PM

## 2018-02-21 ENCOUNTER — Telehealth: Payer: Self-pay | Admitting: *Deleted

## 2018-02-21 DIAGNOSIS — F419 Anxiety disorder, unspecified: Secondary | ICD-10-CM

## 2018-02-21 DIAGNOSIS — F4310 Post-traumatic stress disorder, unspecified: Secondary | ICD-10-CM

## 2018-02-21 DIAGNOSIS — F339 Major depressive disorder, recurrent, unspecified: Secondary | ICD-10-CM

## 2018-02-21 DIAGNOSIS — F411 Generalized anxiety disorder: Secondary | ICD-10-CM

## 2018-02-21 DIAGNOSIS — F329 Major depressive disorder, single episode, unspecified: Secondary | ICD-10-CM

## 2018-02-21 MED ORDER — HYDROXYZINE PAMOATE 25 MG PO CAPS: 25.0000 mg | ORAL_CAPSULE | Freq: Three times a day (TID) | ORAL | 2 refills | Status: DC | PRN

## 2018-02-21 MED ORDER — OMEPRAZOLE 40 MG PO CPDR: 40.0000 mg | DELAYED_RELEASE_CAPSULE | Freq: Every day | ORAL | 2 refills | Status: DC

## 2018-02-21 MED ORDER — BENZTROPINE MESYLATE 1 MG PO TABS: 1.0000 mg | ORAL_TABLET | Freq: Two times a day (BID) | ORAL | 2 refills | Status: DC

## 2018-02-21 MED ORDER — ZIPRASIDONE HCL 80 MG PO CAPS: 80.0000 mg | ORAL_CAPSULE | Freq: Two times a day (BID) | ORAL | 2 refills | Status: DC

## 2018-02-21 NOTE — Telephone Encounter (Signed)
[facility] Rx for Bactrim DS/Septra DS Rx #180 with 3 RFs per Dr. Louanne Skyeettinger, cancel this Rx pt does not need this Rx long term. Other medications that were signed on 01/24/18 [facility].

## 2018-04-13 ENCOUNTER — Ambulatory Visit: Payer: Self-pay | Admitting: Family Medicine

## 2018-05-22 ENCOUNTER — Ambulatory Visit: Payer: Self-pay | Admitting: Family Medicine

## 2018-08-31 ENCOUNTER — Encounter: Payer: Self-pay | Admitting: Family Medicine

## 2018-08-31 ENCOUNTER — Ambulatory Visit (INDEPENDENT_AMBULATORY_CARE_PROVIDER_SITE_OTHER): Payer: Self-pay | Admitting: Family Medicine

## 2018-08-31 DIAGNOSIS — F419 Anxiety disorder, unspecified: Secondary | ICD-10-CM

## 2018-08-31 DIAGNOSIS — F411 Generalized anxiety disorder: Secondary | ICD-10-CM

## 2018-08-31 DIAGNOSIS — F339 Major depressive disorder, recurrent, unspecified: Secondary | ICD-10-CM

## 2018-08-31 DIAGNOSIS — F3181 Bipolar II disorder: Secondary | ICD-10-CM

## 2018-08-31 DIAGNOSIS — N529 Male erectile dysfunction, unspecified: Secondary | ICD-10-CM | POA: Insufficient documentation

## 2018-08-31 DIAGNOSIS — F4310 Post-traumatic stress disorder, unspecified: Secondary | ICD-10-CM

## 2018-08-31 DIAGNOSIS — F329 Major depressive disorder, single episode, unspecified: Secondary | ICD-10-CM

## 2018-08-31 DIAGNOSIS — F32A Depression, unspecified: Secondary | ICD-10-CM

## 2018-08-31 DIAGNOSIS — K269 Duodenal ulcer, unspecified as acute or chronic, without hemorrhage or perforation: Secondary | ICD-10-CM

## 2018-08-31 DIAGNOSIS — B351 Tinea unguium: Secondary | ICD-10-CM

## 2018-08-31 MED ORDER — TERBINAFINE HCL 250 MG PO TABS: 250.0000 mg | ORAL_TABLET | Freq: Every day | ORAL | 1 refills | Status: DC

## 2018-08-31 MED ORDER — VENLAFAXINE HCL ER 75 MG PO CP24: 225.0000 mg | ORAL_CAPSULE | Freq: Every day | ORAL | 11 refills | Status: DC

## 2018-08-31 MED ORDER — BENZTROPINE MESYLATE 1 MG PO TABS: 1.0000 mg | ORAL_TABLET | Freq: Two times a day (BID) | ORAL | 2 refills | Status: DC

## 2018-08-31 MED ORDER — LATUDA 20 MG PO TABS: 20.0000 mg | ORAL_TABLET | Freq: Every day | ORAL | 3 refills | Status: DC

## 2018-08-31 MED ORDER — HYDROXYZINE PAMOATE 25 MG PO CAPS: 25.0000 mg | ORAL_CAPSULE | Freq: Three times a day (TID) | ORAL | 2 refills | Status: DC | PRN

## 2018-08-31 MED ORDER — SILDENAFIL CITRATE 20 MG PO TABS: 20.0000 mg | ORAL_TABLET | ORAL | 3 refills | Status: DC | PRN

## 2018-08-31 MED ORDER — OMEPRAZOLE 40 MG PO CPDR: 40.0000 mg | DELAYED_RELEASE_CAPSULE | Freq: Every day | ORAL | 2 refills | Status: DC

## 2018-08-31 MED ORDER — CEPHALEXIN 500 MG PO CAPS: 500.0000 mg | ORAL_CAPSULE | Freq: Four times a day (QID) | ORAL | 0 refills | Status: DC

## 2018-08-31 NOTE — Progress Notes (Signed)
Virtual Visit via telephone Note  I connected with Nicholas Yates on 08/31/18 at 1319 by telephone and verified that I am speaking with the correct person using two identifiers. Nicholas Yates is currently located at home and friend Nicholas Yates are currently with her during visit. The provider, Fransisca Kaufmann Dettinger, MD is located in their office at time of visit.  Call ended at 1339  I discussed the limitations, risks, security and privacy concerns of performing an evaluation and management service by telephone and the availability of in person appointments. I also discussed with the patient that there may be a patient responsible charge related to this service. The patient expressed understanding and agreed to proceed.   History and Present Illness: Anxiety and bipolar and depression Patient is having more tremors in ankles and feet because of EPS symptoms and tremors. He has been taking Geodon and Effexor and EPS symptoms and tremors have been worsening and so he wants to change from the Geodon.  He is also been taking Cogentin and it was helping but it seems to get to the point where he is not helping as much.  Patient denies any suicidal ideations or thoughts of hurting himself.  His anxiety has been controlled but the problem is the EPS symptoms and tremors especially in his feet are getting a lot worse.  Feels like he is very stable because of his current job and everything is leveling out and is doing a lot better.  GERD Patient is currently on omeprazole.  She denies any major symptoms or abdominal pain or belching or burping. She denies any blood in her stool or lightheadedness or dizziness.  She has a history of duodenal ulcer but he denies any symptoms currently  ED Patient complains of erectile dysfunction and would like to try something to help with erectile dysfunction.  He would like to try some Viagra if possible.  She says his toenails were greatly improving from the toenail  fungus but he stopped it because he ran out and its been sometime and is starting to creep back in.  He says it got to about only a third of the toenail left had fungus in it and now it is closer to half.  Like to try the medication again.  No diagnosis found.  Outpatient Encounter Medications as of 08/31/2018  Medication Sig  . benztropine (COGENTIN) 1 MG tablet Take 1 tablet (1 mg total) by mouth 2 (two) times daily.  . hydrOXYzine (VISTARIL) 25 MG capsule Take 1 capsule (25 mg total) by mouth 3 (three) times daily as needed.  Marland Kitchen omeprazole (PRILOSEC) 40 MG capsule Take 1 capsule (40 mg total) by mouth daily.  Marland Kitchen sulfamethoxazole-trimethoprim (BACTRIM DS,SEPTRA DS) 800-160 MG tablet Take 1 tablet by mouth 2 (two) times daily.  Marland Kitchen terbinafine (LAMISIL) 250 MG tablet Take 1 tablet (250 mg total) by mouth daily.  Marland Kitchen venlafaxine XR (EFFEXOR-XR) 75 MG 24 hr capsule Take 3 capsules (225 mg total) by mouth daily with breakfast.  . ziprasidone (GEODON) 80 MG capsule Take 1 capsule (80 mg total) by mouth 2 (two) times daily with a meal.   No facility-administered encounter medications on file as of 08/31/2018.     Review of Systems  Constitutional: Negative for chills and fever.  Eyes: Negative for discharge.  Respiratory: Negative for shortness of breath and wheezing.   Cardiovascular: Negative for chest pain and leg swelling.  Musculoskeletal: Negative for back pain and gait problem.  Skin: Positive for  color change. Negative for rash.  Neurological: Positive for tremors. Negative for weakness, light-headedness and headaches.  Psychiatric/Behavioral: Negative for decreased concentration, dysphoric mood, self-injury, sleep disturbance and suicidal ideas.  All other systems reviewed and are negative.   Observations/Objective: Patient sounds comfortable and in no acute distress  Assessment and Plan: Problem List Items Addressed This Visit      Digestive   Duodenal ulcer     Other    Depression, recurrent (HCC)   Relevant Medications   benztropine (COGENTIN) 1 MG tablet   hydrOXYzine (VISTARIL) 25 MG capsule   venlafaxine XR (EFFEXOR-XR) 75 MG 24 hr capsule   PTSD (post-traumatic stress disorder) - Primary   Relevant Medications   benztropine (COGENTIN) 1 MG tablet   hydrOXYzine (VISTARIL) 25 MG capsule   venlafaxine XR (EFFEXOR-XR) 75 MG 24 hr capsule   Generalized anxiety disorder   Relevant Medications   benztropine (COGENTIN) 1 MG tablet   hydrOXYzine (VISTARIL) 25 MG capsule   omeprazole (PRILOSEC) 40 MG capsule   venlafaxine XR (EFFEXOR-XR) 75 MG 24 hr capsule   Bipolar 2 disorder (HCC)   ED (erectile dysfunction)   Relevant Medications   sildenafil (REVATIO) 20 MG tablet    Other Visit Diagnoses    Onychomycosis       Relevant Medications   terbinafine (LAMISIL) 250 MG tablet   cephALEXin (KEFLEX) 500 MG capsule   Anxiety and depression       Relevant Medications   benztropine (COGENTIN) 1 MG tablet   hydrOXYzine (VISTARIL) 25 MG capsule   venlafaxine XR (EFFEXOR-XR) 75 MG 24 hr capsule       Follow Up Instructions: Follow-up in a few weeks.    I discussed the assessment and treatment plan with the patient. The patient was provided an opportunity to ask questions and all were answered. The patient agreed with the plan and demonstrated an understanding of the instructions.   The patient was advised to call back or seek an in-person evaluation if the symptoms worsen or if the condition fails to improve as anticipated.  The above assessment and management plan was discussed with the patient. The patient verbalized understanding of and has agreed to the management plan. Patient is aware to call the clinic if symptoms persist or worsen. Patient is aware when to return to the clinic for a follow-up visit. Patient educated on when it is appropriate to go to the emergency department.    I provided 20 minutes of non-face-to-face time during this  encounter.    Nils PyleJoshua A Dettinger, MD

## 2018-09-17 ENCOUNTER — Encounter: Payer: Self-pay | Admitting: Family Medicine

## 2018-09-17 ENCOUNTER — Other Ambulatory Visit: Payer: Self-pay

## 2018-09-17 ENCOUNTER — Ambulatory Visit (INDEPENDENT_AMBULATORY_CARE_PROVIDER_SITE_OTHER): Payer: Medicaid Other | Admitting: Family Medicine

## 2018-09-17 DIAGNOSIS — F339 Major depressive disorder, recurrent, unspecified: Secondary | ICD-10-CM | POA: Diagnosis not present

## 2018-09-17 DIAGNOSIS — F3181 Bipolar II disorder: Secondary | ICD-10-CM | POA: Diagnosis not present

## 2018-09-17 DIAGNOSIS — F411 Generalized anxiety disorder: Secondary | ICD-10-CM

## 2018-09-17 DIAGNOSIS — F4310 Post-traumatic stress disorder, unspecified: Secondary | ICD-10-CM

## 2018-09-17 MED ORDER — LURASIDONE HCL 40 MG PO TABS: 40.0000 mg | ORAL_TABLET | Freq: Every day | ORAL | 1 refills | Status: DC

## 2018-09-17 NOTE — Progress Notes (Signed)
Virtual Visit via telephone Note  I connected with Nicholas Yates on 09/17/18 at 1517 by telephone and verified that I am speaking with the correct person using two identifiers. Nicholas Yates is currently located at home and significant other are currently with her during visit. The provider, Fransisca Kaufmann Chaim Gatley, MD is located in their office at time of visit.  Call ended at 1528  I discussed the limitations, risks, security and privacy concerns of performing an evaluation and management service by telephone and the availability of in person appointments. I also discussed with the patient that there may be a patient responsible charge related to this service. The patient expressed understanding and agreed to proceed.   History and Present Illness: Depression Patient says he feels overwhelmed and not happy.  He is not sleeping well at night and sleepy.  He is more anxious.  His leg is shaking since the change.  The tremor has not resolved.  Tasks at work are affected by his mood.  He is still taking effexor and was switched to latuda. Patient denies any thoughts of suicide.   No diagnosis found.  Outpatient Encounter Medications as of 09/17/2018  Medication Sig  . benztropine (COGENTIN) 1 MG tablet Take 1 tablet (1 mg total) by mouth 2 (two) times daily.  . cephALEXin (KEFLEX) 500 MG capsule Take 1 capsule (500 mg total) by mouth 4 (four) times daily.  . hydrOXYzine (VISTARIL) 25 MG capsule Take 1 capsule (25 mg total) by mouth 3 (three) times daily as needed.  . lurasidone (LATUDA) 20 MG TABS tablet Take 1 tablet (20 mg total) by mouth daily.  Marland Kitchen omeprazole (PRILOSEC) 40 MG capsule Take 1 capsule (40 mg total) by mouth daily.  . sildenafil (REVATIO) 20 MG tablet Take 1-3 tablets (20-60 mg total) by mouth as needed.  . terbinafine (LAMISIL) 250 MG tablet Take 1 tablet (250 mg total) by mouth daily.  Marland Kitchen venlafaxine XR (EFFEXOR-XR) 75 MG 24 hr capsule Take 3 capsules (225 mg total) by mouth daily  with breakfast.   No facility-administered encounter medications on file as of 09/17/2018.     Review of Systems  Constitutional: Negative for chills and fever.  Eyes: Negative for discharge.  Respiratory: Negative for shortness of breath and wheezing.   Cardiovascular: Negative for chest pain and leg swelling.  Musculoskeletal: Negative for back pain and gait problem.  Skin: Negative for rash.  Psychiatric/Behavioral: Positive for dysphoric mood and sleep disturbance. Negative for self-injury and suicidal ideas. The patient is nervous/anxious.   All other systems reviewed and are negative.   Observations/Objective: Patient sounds comfortable and in no acute distress  Assessment and Plan: Problem List Items Addressed This Visit      Other   Depression, recurrent (HCC)   Relevant Medications   lurasidone (LATUDA) 40 MG TABS tablet   PTSD (post-traumatic stress disorder) - Primary   Relevant Medications   lurasidone (LATUDA) 40 MG TABS tablet   Generalized anxiety disorder   Relevant Medications   lurasidone (LATUDA) 40 MG TABS tablet   Bipolar 2 disorder (HCC)   Relevant Medications   lurasidone (LATUDA) 40 MG TABS tablet       Follow Up Instructions:  Follow-up in 2 weeks  Increased patient's Latuda from 20 to 40 mg because of the anxiety increased and will see if that helps, still having the same tremors as before hopefully those reduce with the change from the Geodon.   I discussed the assessment and treatment plan  with the patient. The patient was provided an opportunity to ask questions and all were answered. The patient agreed with the plan and demonstrated an understanding of the instructions.   The patient was advised to call back or seek an in-person evaluation if the symptoms worsen or if the condition fails to improve as anticipated.  The above assessment and management plan was discussed with the patient. The patient verbalized understanding of and has agreed  to the management plan. Patient is aware to call the clinic if symptoms persist or worsen. Patient is aware when to return to the clinic for a follow-up visit. Patient educated on when it is appropriate to go to the emergency department.    I provided 11 minutes of non-face-to-face time during this encounter.    Nils PyleJoshua A Saahas Hidrogo, MD

## 2018-09-19 ENCOUNTER — Ambulatory Visit: Payer: Self-pay | Admitting: Family Medicine

## 2018-10-04 ENCOUNTER — Encounter: Payer: Self-pay | Admitting: Family Medicine

## 2018-10-04 ENCOUNTER — Ambulatory Visit (INDEPENDENT_AMBULATORY_CARE_PROVIDER_SITE_OTHER): Payer: Medicaid Other | Admitting: Family Medicine

## 2018-10-04 DIAGNOSIS — F339 Major depressive disorder, recurrent, unspecified: Secondary | ICD-10-CM

## 2018-10-04 DIAGNOSIS — F3181 Bipolar II disorder: Secondary | ICD-10-CM | POA: Diagnosis not present

## 2018-10-04 DIAGNOSIS — F411 Generalized anxiety disorder: Secondary | ICD-10-CM

## 2018-10-04 DIAGNOSIS — F32A Depression, unspecified: Secondary | ICD-10-CM

## 2018-10-04 DIAGNOSIS — F419 Anxiety disorder, unspecified: Secondary | ICD-10-CM

## 2018-10-04 DIAGNOSIS — F4310 Post-traumatic stress disorder, unspecified: Secondary | ICD-10-CM

## 2018-10-04 DIAGNOSIS — R29818 Other symptoms and signs involving the nervous system: Secondary | ICD-10-CM

## 2018-10-04 DIAGNOSIS — F329 Major depressive disorder, single episode, unspecified: Secondary | ICD-10-CM

## 2018-10-04 MED ORDER — OLANZAPINE 5 MG PO TABS: 5.0000 mg | ORAL_TABLET | Freq: Every day | ORAL | 1 refills | Status: DC

## 2018-10-04 NOTE — Progress Notes (Signed)
Virtual Visit via telephone Note  I connected with Nicholas Fuchsonald Raftery on 10/04/18 at 1406 by telephone and verified that I am speaking with the correct person using two identifiers. Nicholas Yates is currently located at home and significant other [place] are currently with her during visit. The provider, Elige RadonJoshua A , MD is located in their office at time of visit.  Call ended at 1422  I discussed the limitations, risks, security and privacy concerns of performing an evaluation and management service by telephone and the availability of in person appointments. I also discussed with the patient that there may be a patient responsible charge related to this service. The patient expressed understanding and agreed to proceed.   History and Present Illness: patient is calling in today for a recheck on depression and anxiety. patient is feeling moody and rough and anxious. He denies any suicidal ideations. He is feeling no motivation or energy or emotion. Patient denies any thoughts of suicide.   No diagnosis found.  Outpatient Encounter Medications as of 10/04/2018  Medication Sig  . benztropine (COGENTIN) 1 MG tablet Take 1 tablet (1 mg total) by mouth 2 (two) times daily.  . cephALEXin (KEFLEX) 500 MG capsule Take 1 capsule (500 mg total) by mouth 4 (four) times daily.  . hydrOXYzine (VISTARIL) 25 MG capsule Take 1 capsule (25 mg total) by mouth 3 (three) times daily as needed.  . lurasidone (LATUDA) 40 MG TABS tablet Take 1 tablet (40 mg total) by mouth daily with breakfast.  . omeprazole (PRILOSEC) 40 MG capsule Take 1 capsule (40 mg total) by mouth daily.  . sildenafil (REVATIO) 20 MG tablet Take 1-3 tablets (20-60 mg total) by mouth as needed.  . terbinafine (LAMISIL) 250 MG tablet Take 1 tablet (250 mg total) by mouth daily.  Marland Kitchen. venlafaxine XR (EFFEXOR-XR) 75 MG 24 hr capsule Take 3 capsules (225 mg total) by mouth daily with breakfast.   No facility-administered encounter  medications on file as of 10/04/2018.     Review of Systems  Constitutional: Negative for chills and fever.  Respiratory: Negative for shortness of breath and wheezing.   Cardiovascular: Negative for chest pain and leg swelling.  Musculoskeletal: Negative for back pain and gait problem.  Skin: Negative for rash.  Neurological: Positive for tremors.  Psychiatric/Behavioral: Positive for dysphoric mood and sleep disturbance. Negative for decreased concentration, self-injury and suicidal ideas. The patient is nervous/anxious.   All other systems reviewed and are negative.   Observations/Objective: Patient sounds comfortable and in no acute distress  Assessment and Plan: Problem List Items Addressed This Visit      Other   Depression, recurrent (HCC)   PTSD (post-traumatic stress disorder)   Generalized anxiety disorder   Bipolar 2 disorder (HCC) - Primary   Relevant Medications   OLANZapine (ZYPREXA) 5 MG tablet   Extrapyramidal symptom   Relevant Orders   Ambulatory referral to Neurology    Other Visit Diagnoses    Anxiety and depression           Follow Up Instructions:  Follow up in 3-4 weeks depression  Patient was not doing well on Latuda and he will taper down off Latuda and then start Zyprexa, did a referral to neurology because he still having the tremor and has not improved even after being off of the Geodon   I discussed the assessment and treatment plan with the patient. The patient was provided an opportunity to ask questions and all were answered. The patient  agreed with the plan and demonstrated an understanding of the instructions.   The patient was advised to call back or seek an in-person evaluation if the symptoms worsen or if the condition fails to improve as anticipated.  The above assessment and management plan was discussed with the patient. The patient verbalized understanding of and has agreed to the management plan. Patient is aware to call the  clinic if symptoms persist or worsen. Patient is aware when to return to the clinic for a follow-up visit. Patient educated on when it is appropriate to go to the emergency department.    I provided 18 minutes of non-face-to-face time during this encounter.    Worthy Rancher, MD

## 2018-11-16 ENCOUNTER — Encounter: Payer: Self-pay | Admitting: Family Medicine

## 2018-11-16 ENCOUNTER — Ambulatory Visit (INDEPENDENT_AMBULATORY_CARE_PROVIDER_SITE_OTHER): Payer: Medicaid Other | Admitting: Family Medicine

## 2018-11-16 DIAGNOSIS — F4310 Post-traumatic stress disorder, unspecified: Secondary | ICD-10-CM | POA: Diagnosis not present

## 2018-11-16 DIAGNOSIS — F339 Major depressive disorder, recurrent, unspecified: Secondary | ICD-10-CM | POA: Diagnosis not present

## 2018-11-16 DIAGNOSIS — F411 Generalized anxiety disorder: Secondary | ICD-10-CM

## 2018-11-16 DIAGNOSIS — F3181 Bipolar II disorder: Secondary | ICD-10-CM | POA: Diagnosis not present

## 2018-11-16 MED ORDER — OLANZAPINE 7.5 MG PO TABS: 7.5000 mg | ORAL_TABLET | Freq: Every day | ORAL | 3 refills | Status: DC

## 2018-11-16 NOTE — Progress Notes (Signed)
Virtual Visit via telephone Note  I connected with Nicholas Yates on 11/16/18 at 1120 by telephone and verified that I am speaking with the correct person using two identifiers. Nicholas FuchsDonald Yates is currently located at home and Nicholas JeffersonLindy Yates are currently with her during visit. The provider, Elige RadonJoshua A Cathaleen Korol, MD is located in their office at time of visit.  Call ended at 1131  I discussed the limitations, risks, security and privacy concerns of performing an evaluation and management service by telephone and the availability of in person appointments. I also discussed with the patient that there may be a patient responsible charge related to this service. The patient expressed understanding and agreed to proceed.   History and Present Illness: Depression and bipolar Patient is currently taking zyprexa and effexor.  He is sleeping and has energy and mood doing a lot better.  He is a little anxious still but other than that he is doing well. He feels like the effects has tapered off some over the past month. He denies any suicidal ideations or thoughts of hurting himself.  He is nervous to adjust medications but does want more help with irritability  No diagnosis found.  Outpatient Encounter Medications as of 11/16/2018  Medication Sig  . benztropine (COGENTIN) 1 MG tablet Take 1 tablet (1 mg total) by mouth 2 (two) times daily.  . hydrOXYzine (VISTARIL) 25 MG capsule Take 1 capsule (25 mg total) by mouth 3 (three) times daily as needed.  Marland Kitchen. OLANZapine (ZYPREXA) 5 MG tablet Take 1 tablet (5 mg total) by mouth at bedtime.  Marland Kitchen. omeprazole (PRILOSEC) 40 MG capsule Take 1 capsule (40 mg total) by mouth daily.  . sildenafil (REVATIO) 20 MG tablet Take 1-3 tablets (20-60 mg total) by mouth as needed.  . terbinafine (LAMISIL) 250 MG tablet Take 1 tablet (250 mg total) by mouth daily.  Marland Kitchen. venlafaxine XR (EFFEXOR-XR) 75 MG 24 hr capsule Take 3 capsules (225 mg total) by mouth daily with breakfast.   No  facility-administered encounter medications on file as of 11/16/2018.     Review of Systems  Constitutional: Negative for chills and fever.  Respiratory: Negative for shortness of breath and wheezing.   Cardiovascular: Negative for chest pain and leg swelling.  Skin: Negative for rash.  Neurological: Negative for dizziness, weakness and numbness.  Psychiatric/Behavioral: Positive for decreased concentration and dysphoric mood. Negative for self-injury, sleep disturbance and suicidal ideas. The patient is nervous/anxious.   All other systems reviewed and are negative.   Observations/Objective: Patient sounds comfortable and in no acute distress  Assessment and Plan: Problem List Items Addressed This Visit      Other   Depression, recurrent (HCC)   Relevant Medications   OLANZapine (ZYPREXA) 7.5 MG tablet   PTSD (post-traumatic stress disorder) - Primary   Relevant Medications   OLANZapine (ZYPREXA) 7.5 MG tablet   Generalized anxiety disorder   Relevant Medications   OLANZapine (ZYPREXA) 7.5 MG tablet   Bipolar 2 disorder (HCC)   Relevant Medications   OLANZapine (ZYPREXA) 7.5 MG tablet       Follow Up Instructions: Increase zyprexa to 7.5mg  Follow up in 3 months  I discussed the assessment and treatment plan with the patient. The patient was provided an opportunity to ask questions and all were answered. The patient agreed with the plan and demonstrated an understanding of the instructions.   The patient was advised to call back or seek an in-person evaluation if the symptoms worsen or if the  condition fails to improve as anticipated.  The above assessment and management plan was discussed with the patient. The patient verbalized understanding of and has agreed to the management plan. Patient is aware to call the clinic if symptoms persist or worsen. Patient is aware when to return to the clinic for a follow-up visit. Patient educated on when it is appropriate to go to the  emergency department.    I provided 11 minutes of non-face-to-face time during this encounter.    Nicholas Rancher, MD

## 2019-01-02 ENCOUNTER — Ambulatory Visit: Payer: Medicaid Other | Admitting: Family Medicine

## 2019-04-19 ENCOUNTER — Ambulatory Visit: Payer: Medicaid Other

## 2019-04-27 ENCOUNTER — Ambulatory Visit: Payer: Medicare Other | Attending: Internal Medicine

## 2019-04-27 DIAGNOSIS — Z23 Encounter for immunization: Secondary | ICD-10-CM | POA: Insufficient documentation

## 2019-04-27 NOTE — Progress Notes (Signed)
   Covid-19 Vaccination Clinic  Name:  Tamari Busic    MRN: [id number] DOB: September 02, 1955  04/27/2019  Mr. Seipp was observed post Covid-19 immunization for 15 minutes without incidence. He was provided with Vaccine Information Sheet and instruction to access the V-Safe system.   Mr. Deroos was instructed to call 911 with any severe reactions post vaccine: Marland Kitchen Difficulty breathing  . Swelling of your face and throat  . A fast heartbeat  . A bad rash all over your body  . Dizziness and weakness    Immunizations Administered    Name Date Dose VIS Date Route   Pfizer COVID-19 Vaccine 04/27/2019  4:48 PM 0.3 mL 03/01/2019 Intramuscular   Manufacturer: [employer], [employer]   Lot: [id number]   NDC: [address]-9

## 2019-05-06 ENCOUNTER — Ambulatory Visit: Payer: Medicaid Other

## 2019-05-22 ENCOUNTER — Ambulatory Visit: Payer: Medicare Other | Attending: Internal Medicine

## 2019-05-22 DIAGNOSIS — Z23 Encounter for immunization: Secondary | ICD-10-CM

## 2019-05-22 NOTE — Progress Notes (Signed)
   Covid-19 Vaccination Clinic  Name:  Nicholas Yates    MRN: [id number] DOB: [birth year]  05/22/2019  [name] was observed post Covid-19 immunization for 15 minutes without incident. He was provided with Vaccine Information Sheet and instruction to access the V-Safe system.   [name] was instructed to call 911 with any severe reactions post vaccine: Marland Kitchen Difficulty breathing  . Swelling of face and throat  . A fast heartbeat  . A bad rash all over body  . Dizziness and weakness   Immunizations Administered    Name Date Dose VIS Date Route   Pfizer COVID-19 Vaccine 05/22/2019  9:46 AM 0.3 mL 03/01/2019 Intramuscular   Manufacturer: [employer], [employer]   Lot: [id number]   NDC: [address]-5

## 2019-09-06 ENCOUNTER — Encounter: Payer: Self-pay | Admitting: Family Medicine

## 2019-09-06 ENCOUNTER — Other Ambulatory Visit: Payer: Self-pay | Admitting: *Deleted

## 2019-09-06 DIAGNOSIS — F411 Generalized anxiety disorder: Secondary | ICD-10-CM

## 2019-09-06 DIAGNOSIS — F4310 Post-traumatic stress disorder, unspecified: Secondary | ICD-10-CM

## 2019-09-06 DIAGNOSIS — F419 Anxiety disorder, unspecified: Secondary | ICD-10-CM

## 2019-09-06 MED ORDER — OMEPRAZOLE 40 MG PO CPDR: 40.0000 mg | DELAYED_RELEASE_CAPSULE | Freq: Every day | ORAL | 0 refills | Status: DC

## 2019-09-06 MED ORDER — VENLAFAXINE HCL ER 75 MG PO CP24: 225.0000 mg | ORAL_CAPSULE | Freq: Every day | ORAL | 0 refills | Status: DC

## 2019-10-12 ENCOUNTER — Other Ambulatory Visit: Payer: Self-pay | Admitting: Family Medicine

## 2019-10-12 DIAGNOSIS — F329 Major depressive disorder, single episode, unspecified: Secondary | ICD-10-CM

## 2019-10-12 DIAGNOSIS — F411 Generalized anxiety disorder: Secondary | ICD-10-CM

## 2019-10-12 DIAGNOSIS — F419 Anxiety disorder, unspecified: Secondary | ICD-10-CM

## 2019-10-12 DIAGNOSIS — F4310 Post-traumatic stress disorder, unspecified: Secondary | ICD-10-CM

## 2019-11-11 ENCOUNTER — Other Ambulatory Visit: Payer: Self-pay

## 2019-11-11 ENCOUNTER — Encounter: Payer: Self-pay | Admitting: Family Medicine

## 2019-11-11 ENCOUNTER — Ambulatory Visit (INDEPENDENT_AMBULATORY_CARE_PROVIDER_SITE_OTHER): Payer: Medicare Other

## 2019-11-11 ENCOUNTER — Ambulatory Visit (INDEPENDENT_AMBULATORY_CARE_PROVIDER_SITE_OTHER): Payer: Medicare Other | Admitting: Family Medicine

## 2019-11-11 ENCOUNTER — Other Ambulatory Visit: Payer: Self-pay | Admitting: Family Medicine

## 2019-11-11 VITALS — BP 125/71 | HR 70 | Temp 99.0°F | Ht 72.0 in | Wt 208.0 lb

## 2019-11-11 DIAGNOSIS — K219 Gastro-esophageal reflux disease without esophagitis: Secondary | ICD-10-CM | POA: Diagnosis not present

## 2019-11-11 DIAGNOSIS — M545 Low back pain, unspecified: Secondary | ICD-10-CM

## 2019-11-11 DIAGNOSIS — F411 Generalized anxiety disorder: Secondary | ICD-10-CM | POA: Diagnosis not present

## 2019-11-11 DIAGNOSIS — Z13220 Encounter for screening for lipoid disorders: Secondary | ICD-10-CM

## 2019-11-11 DIAGNOSIS — Z131 Encounter for screening for diabetes mellitus: Secondary | ICD-10-CM

## 2019-11-11 DIAGNOSIS — F329 Major depressive disorder, single episode, unspecified: Secondary | ICD-10-CM

## 2019-11-11 DIAGNOSIS — F419 Anxiety disorder, unspecified: Secondary | ICD-10-CM

## 2019-11-11 DIAGNOSIS — F4310 Post-traumatic stress disorder, unspecified: Secondary | ICD-10-CM | POA: Diagnosis not present

## 2019-11-11 MED ORDER — VENLAFAXINE HCL ER 75 MG PO CP24: 225.0000 mg | ORAL_CAPSULE | Freq: Every day | ORAL | 1 refills | Status: DC

## 2019-11-11 MED ORDER — OMEPRAZOLE 40 MG PO CPDR: 40.0000 mg | DELAYED_RELEASE_CAPSULE | Freq: Every day | ORAL | 3 refills | Status: DC

## 2019-11-11 MED ORDER — DICLOFENAC SODIUM 1 % EX GEL: 2.0000 g | Freq: Four times a day (QID) | CUTANEOUS | 3 refills | Status: DC

## 2019-11-11 NOTE — Progress Notes (Signed)
BP 125/71   Pulse 70   Temp 99 F (37.2 C)   Ht 6' (1.829 m)   Wt 208 lb (94.3 kg)   SpO2 95%   BMI 28.21 kg/m    Subjective:   Patient ID: Nicholas Yates, male    DOB: May 12, 1955, 64 y.o.   MRN: [id number]  HPI: Nicholas Yates is a 64 y.o. male presenting on 11/11/2019 for Medical Management of Chronic Issues and Post-Traumatic Stress Disorder   HPI Patient is coming in for recheck for PTSD and anxiety and depression. 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  He is continue with his job.  He says his Effexor is working very well for him as a he is sleeping well and denies any suicidal ideations or thoughts of hurt himself.  He says his anxiety is under control. Depression screen [facility] 2/9 11/11/2019 10/02/2017 06/29/2017 04/24/2017 01/20/2017  Decreased Interest 0 _0 Down, Depressed, Hopeless 0 _1 PHQ - 2 Score 0 _2 Altered sleeping - _3 Tired, decreased energy - _4 Change in appetite - _5 Feeling bad or failure about yourself  - _6 Trouble concentrating - _7 0  Moving slowly or fidgety/restless - 0 2 0 0  Suicidal thoughts - 0 0 0 0  PHQ-9 Score - _8 Difficult doing work/chores - - - - -    GERD Patient is currently on omeprazole.  She denies any major symptoms or abdominal pain or belching or burping. She denies any blood in her stool or lightheadedness or dizziness.   Back pain Patient is coming in complaining of bilateral back pain that is been bothering him over the past week or 2.  He denies anything specific that he is done but he does have to do a lot of lifting and moving things at work and he thinks that may have irritated him.  Patient denies any numbness or weakness in either legs.  Relevant past medical, surgical, family and social history reviewed and updated as indicated. Interim medical history since our last visit reviewed. Allergies and medications reviewed  and updated.  Review of Systems  Constitutional: Negative for chills and fever.  Eyes: Negative for visual disturbance.  Respiratory: Negative for shortness of breath and wheezing.   Cardiovascular: Negative for chest pain and leg swelling.  Musculoskeletal: Positive for back pain. Negative for gait problem.  Skin: Negative for rash.  Neurological: Negative for dizziness, weakness and numbness.  Psychiatric/Behavioral: Negative for dysphoric mood, self-injury, sleep disturbance and suicidal ideas. The patient is not nervous/anxious.   All other systems reviewed and are negative.   Per HPI unless specifically indicated above   Allergies as of 11/11/2019   No Known Allergies     Medication List       Accurate as of November 11, 2019  1:16 PM. If you have any questions, ask your nurse or doctor.        STOP taking these medications   OLANZapine 7.5 MG tablet Commonly known as: ZyPREXA Stopped by: Worthy Rancher, MD   sildenafil 20 MG tablet Commonly known as: REVATIO Stopped by: Fransisca Kaufmann Crystalann Korf, MD     TAKE these medications   omeprazole 40 MG capsule Commonly known as: PRILOSEC TAKE (1) CAPSULE  DAILY   venlafaxine XR 75 MG 24 hr capsule Commonly known as: EFFEXOR-XR TAKE 3 CAPSULES ONCE DAILY WITH BREAKFAST        Objective:   BP 125/71   Pulse 70   Temp 99 F (37.2 C)   Ht 6' (1.829 m)   Wt 208 lb (94.3 kg)   SpO2 95%   BMI 28.21 kg/m   Wt Readings from Last 3 Encounters:  11/11/19 208 lb (94.3 kg)  01/05/18 197 lb (89.4 kg)  10/02/17 198 lb (89.8 kg)    Physical Exam Vitals and nursing note reviewed.  Constitutional:      General: He is not in acute distress.    Appearance: He is well-developed. He is not diaphoretic.  Eyes:     General: No scleral icterus.    Conjunctiva/sclera: Conjunctivae normal.  Neck:     Thyroid: No thyromegaly.  Cardiovascular:     Rate and Rhythm: Normal rate and regular rhythm.     Heart sounds: Normal heart  sounds. No murmur heard.   Pulmonary:     Effort: Pulmonary effort is normal. No respiratory distress.     Breath sounds: Normal breath sounds. No wheezing.  Musculoskeletal:        General: Normal range of motion.     Cervical back: Neck supple.     Lumbar back: Tenderness present. No swelling, deformity, spasms or bony tenderness. Normal range of motion. Negative right straight leg raise test and negative left straight leg raise test.  Lymphadenopathy:     Cervical: No cervical adenopathy.  Skin:    General: Skin is warm and dry.     Findings: No rash.  Neurological:     Mental Status: He is alert and oriented to person, place, and time.     Coordination: Coordination normal.  Psychiatric:        Behavior: Behavior normal.      Assessment & Plan:   Problem List Items Addressed This Visit      Other   PTSD (post-traumatic stress disorder) - Primary   Relevant Medications   venlafaxine XR (EFFEXOR-XR) 75 MG 24 hr capsule   Other Relevant Orders   CBC with Differential/Platelet (Completed)   TSH (Completed)   Generalized anxiety disorder   Relevant Medications   venlafaxine XR (EFFEXOR-XR) 75 MG 24 hr capsule   omeprazole (PRILOSEC) 40 MG capsule   Other Relevant Orders   CBC with Differential/Platelet (Completed)    Other Visit Diagnoses    Anxiety and depression       Relevant Medications   venlafaxine XR (EFFEXOR-XR) 75 MG 24 hr capsule   Other Relevant Orders   TSH (Completed)   Gastroesophageal reflux disease without esophagitis       Relevant Medications   omeprazole (PRILOSEC) 40 MG capsule   Other Relevant Orders   CBC with Differential/Platelet (Completed)   Lipid screening       Relevant Orders   Lipid panel (Completed)   Diabetes mellitus screening       Relevant Orders   CMP14+EGFR (Completed)   Acute bilateral low back pain, unspecified whether sciatica present       Relevant Medications   diclofenac Sodium (VOLTAREN) 1 % GEL      Lumbar  x-ray: No acute bony abnormality noted.  Await final read from radiology.  Likely hematoma and bruised, recommended topical therapy and stretching and heat pad  Continue Effexor and omeprazole for PTSD and GERD, seems to be doing well  We will do blood work today Follow up plan: Return in about 6 months (around 05/13/2020), or if symptoms worsen or fail to improve, for GERD and anxiety.  Counseling provided for all of the vaccine components No orders of the defined types were placed in this encounter.   Caryl Pina, MD [facility] 11/11/2019, 1:16 PM

## 2019-11-12 LAB — CMP14+EGFR
ALT: 13 IU/L (ref 0–44)
AST: 24 IU/L (ref 0–40)
Albumin/Globulin Ratio: 1.5 (ref 1.2–2.2)
Albumin: 4.3 g/dL (ref 3.8–4.8)
Alkaline Phosphatase: 114 IU/L (ref 48–121)
BUN/Creatinine Ratio: 15 (ref 10–24)
BUN: 18 mg/dL (ref 8–27)
Bilirubin Total: 0.4 mg/dL (ref 0.0–1.2)
CO2: 25 mmol/L (ref 20–29)
Calcium: 9.3 mg/dL (ref 8.6–10.2)
Chloride: 103 mmol/L (ref 96–106)
Creatinine, Ser: 1.17 mg/dL (ref 0.76–1.27)
GFR calc Af Amer: 75 mL/min/{1.73_m2} (ref >59)
GFR calc non Af Amer: 65 mL/min/{1.73_m2} (ref >59)
Globulin, Total: 2.9 g/dL (ref 1.5–4.5)
Glucose: 85 mg/dL (ref 65–99)
Potassium: 4.9 mmol/L (ref 3.5–5.2)
Sodium: 140 mmol/L (ref 134–144)
Total Protein: 7.2 g/dL (ref 6.0–8.5)

## 2019-11-12 LAB — LIPID PANEL
Chol/HDL Ratio: 2.3 ratio (ref 0.0–5.0)
Cholesterol, Total: 201 mg/dL — ABNORMAL HIGH (ref 100–199)
HDL: 87 mg/dL (ref >39)
LDL Chol Calc (NIH): 104 mg/dL — ABNORMAL HIGH (ref 0–99)
Triglycerides: 52 mg/dL (ref 0–149)
VLDL Cholesterol Cal: 10 mg/dL (ref 5–40)

## 2019-11-12 LAB — CBC WITH DIFFERENTIAL/PLATELET
Basophils Absolute: 0.1 10*3/uL (ref 0.0–0.2)
Basos: 1 %
EOS (ABSOLUTE): 0.3 10*3/uL (ref 0.0–0.4)
Eos: 4 %
Hematocrit: 42.3 % (ref 37.5–51.0)
Hemoglobin: 14 g/dL (ref 13.0–17.7)
Immature Grans (Abs): 0 10*3/uL (ref 0.0–0.1)
Immature Granulocytes: 0 %
Lymphocytes Absolute: 2.2 10*3/uL (ref 0.7–3.1)
Lymphs: 32 %
MCH: 31.8 pg (ref 26.6–33.0)
MCHC: 33.1 g/dL (ref 31.5–35.7)
MCV: 96 fL (ref 79–97)
Monocytes Absolute: 0.7 10*3/uL (ref 0.1–0.9)
Monocytes: 9 %
Neutrophils Absolute: 3.7 10*3/uL (ref 1.4–7.0)
Neutrophils: 54 %
Platelets: 396 10*3/uL (ref 150–450)
RBC: 4.4 x10E6/uL (ref 4.14–5.80)
RDW: 12.6 % (ref 11.6–15.4)
WBC: 7 10*3/uL (ref 3.4–10.8)

## 2019-11-12 LAB — TSH: TSH: 1.41 u[IU]/mL (ref 0.450–4.500)

## 2019-12-10 ENCOUNTER — Encounter: Payer: Self-pay | Admitting: Family Medicine

## 2019-12-11 MED ORDER — SILDENAFIL CITRATE 20 MG PO TABS: 20.0000 mg | ORAL_TABLET | ORAL | 1 refills | Status: DC | PRN

## 2019-12-19 ENCOUNTER — Telehealth: Payer: Self-pay | Admitting: Family Medicine

## 2019-12-19 NOTE — Telephone Encounter (Signed)
LMTCB

## 2019-12-25 ENCOUNTER — Telehealth: Payer: Self-pay

## 2019-12-25 NOTE — Telephone Encounter (Signed)
Appt made

## 2019-12-26 NOTE — Telephone Encounter (Signed)
Phone call taken care of in different encounter.  This encounter will now be closed  

## 2019-12-27 ENCOUNTER — Other Ambulatory Visit: Payer: Self-pay

## 2019-12-27 ENCOUNTER — Ambulatory Visit (INDEPENDENT_AMBULATORY_CARE_PROVIDER_SITE_OTHER): Payer: Medicare Other | Admitting: Family Medicine

## 2019-12-27 ENCOUNTER — Encounter: Payer: Self-pay | Admitting: Family Medicine

## 2019-12-27 VITALS — BP 144/75 | HR 100 | Temp 97.0°F | Ht 72.0 in | Wt 210.0 lb

## 2019-12-27 DIAGNOSIS — S81801A Unspecified open wound, right lower leg, initial encounter: Secondary | ICD-10-CM | POA: Diagnosis not present

## 2019-12-27 NOTE — Progress Notes (Signed)
BP (!) 144/75   Pulse 100   Temp (!) 97 F (36.1 C)   Ht 6' (1.829 m)   Wt 210 lb (95.3 kg)   SpO2 97%   BMI 28.48 kg/m    Subjective:   Patient ID: Nicholas Yates, male    DOB: 02/17/1956, 64 y.o.   MRN: [id number]  HPI: Nicholas Yates is a 64 y.o. male presenting on 12/27/2019 for Wound Check (right leg, dog bite. not improving. Some pain and draingage)   HPI Patient is coming in for a wound on his right shin, he says it was initially from a small dog bite but it has been there for close to a month now and is not fully healing.  Patient says he has been using peroxide on it and that is just not healing significantly.  Patient denies any significant pain or pruritus.  Has had the occasional clear drainage.  Relevant past medical, surgical, family and social history reviewed and updated as indicated. Interim medical history since our last visit reviewed. Allergies and medications reviewed and updated.  Review of Systems  Constitutional: Negative for chills and fever.  Respiratory: Negative for shortness of breath and wheezing.   Cardiovascular: Negative for chest pain and leg swelling.  Skin: Positive for wound. Negative for color change and rash.  All other systems reviewed and are negative.   Per HPI unless specifically indicated above   Allergies as of 12/27/2019   No Known Allergies     Medication List       Accurate as of December 27, 2019  4:34 PM. If you have any questions, ask your nurse or doctor.        diclofenac Sodium 1 % Gel Commonly known as: Voltaren Apply 2 g topically 4 (four) times daily.   omeprazole 40 MG capsule Commonly known as: PRILOSEC Take 1 capsule (40 mg total) by mouth daily.   sildenafil 20 MG tablet Commonly known as: REVATIO Take 1-3 tablets (20-60 mg total) by mouth as needed.   venlafaxine XR 75 MG 24 hr capsule Commonly known as: EFFEXOR-XR Take 3 capsules (225 mg total) by mouth daily with breakfast.        Objective:     BP (!) 144/75   Pulse 100   Temp (!) 97 F (36.1 C)   Ht 6' (1.829 m)   Wt 210 lb (95.3 kg)   SpO2 97%   BMI 28.48 kg/m   Wt Readings from Last 3 Encounters:  12/27/19 210 lb (95.3 kg)  11/11/19 208 lb (94.3 kg)  01/05/18 197 lb (89.4 kg)    Physical Exam Vitals and nursing note reviewed.  Skin:    Findings: Wound present. No erythema.          Wound care: Recommended to have a piece of Xeroform gauze in place in and around wound and then cover with simple gauze and wrapped with an Ace bandage or any other kind of tape wrap that he has.  Change daily, leave uncovered for 1 hour/day in between changes.  Assessment & Plan:   Problem List Items Addressed This Visit    None    Visit Diagnoses    Leg wound, right, initial encounter    -  Primary       Follow up plan: Return if symptoms worsen or fail to improve.  Counseling provided for all of the vaccine components No orders of the defined types were placed in this encounter.   Arville Care,  MD [facility] 12/27/2019, 4:34 PM

## 2020-01-15 ENCOUNTER — Other Ambulatory Visit: Payer: Self-pay

## 2020-01-15 ENCOUNTER — Ambulatory Visit (INDEPENDENT_AMBULATORY_CARE_PROVIDER_SITE_OTHER): Payer: Medicare Other

## 2020-01-15 DIAGNOSIS — Z23 Encounter for immunization: Secondary | ICD-10-CM | POA: Diagnosis not present

## 2020-01-15 NOTE — Progress Notes (Signed)
   Covid-19 Vaccination Clinic  Name:  Nicholas Yates    MRN: [id number] DOB: Apr 05, 1955  01/15/2020  Nicholas Yates was observed post Covid-19 immunization for 15 minutes without incident. He was provided with Vaccine Information Sheet and instruction to access the V-Safe system.   Nicholas Yates was instructed to call 911 with any severe reactions post vaccine: Marland Kitchen Difficulty breathing  . Swelling of face and throat  . A fast heartbeat  . A bad rash all over body  . Dizziness and weakness

## 2020-01-20 ENCOUNTER — Ambulatory Visit (INDEPENDENT_AMBULATORY_CARE_PROVIDER_SITE_OTHER): Payer: Medicare Other | Admitting: Family Medicine

## 2020-01-20 ENCOUNTER — Other Ambulatory Visit: Payer: Self-pay

## 2020-01-20 ENCOUNTER — Encounter: Payer: Self-pay | Admitting: Family Medicine

## 2020-01-20 VITALS — BP 126/78 | HR 61 | Temp 97.4°F | Ht 72.0 in | Wt 208.8 lb

## 2020-01-20 DIAGNOSIS — S81801D Unspecified open wound, right lower leg, subsequent encounter: Secondary | ICD-10-CM

## 2020-01-20 DIAGNOSIS — Z23 Encounter for immunization: Secondary | ICD-10-CM

## 2020-01-20 NOTE — Progress Notes (Signed)
BP 126/78   Pulse 61   Temp (!) 97.4 F (36.3 C)   Ht 6' (1.829 m)   Wt 208 lb 12.8 oz (94.7 kg)   SpO2 100%   BMI 28.32 kg/m    Subjective:   Patient ID: Nicholas Yates, male    DOB: [birth year], 64 y.o.   MRN: [id number]  HPI: Nicholas Yates is a 64 y.o. male presenting on 01/20/2020 for Wound Check (right leg)   HPI Patient is coming in for right leg wound.  Is coming in for recheck of this.  The last time he was seen was about 1 month ago.  He has been doing dressing changes every day for this leg wound.  He has been continuing to use the Xeroform and then a bandage and then will keep it dry some of the day, about an hour or 2.  He is also been using triple antibiotic ointment on it every day.  He does feel like it shrinking in size and denies any redness or warmth.  Relevant past medical, surgical, family and social history reviewed and updated as indicated. Interim medical history since our last visit reviewed. Allergies and medications reviewed and updated.  Review of Systems  Constitutional: Negative for chills and fever.  Respiratory: Negative for shortness of breath and wheezing.   Cardiovascular: Negative for chest pain.  Musculoskeletal: Negative for back pain and gait problem.  Skin: Positive for wound. Negative for color change and rash.  All other systems reviewed and are negative.   Per HPI unless specifically indicated above   Allergies as of 01/20/2020   No Known Allergies     Medication List       Accurate as of January 20, 2020  9:34 AM. If you have any questions, ask your nurse or doctor.        diclofenac Sodium 1 % Gel Commonly known as: Voltaren Apply 2 g topically 4 (four) times daily.   omeprazole 40 MG capsule Commonly known as: PRILOSEC Take 1 capsule (40 mg total) by mouth daily.   sildenafil 20 MG tablet Commonly known as: REVATIO Take 1-3 tablets (20-60 mg total) by mouth as needed.   venlafaxine XR 75 MG 24 hr capsule Commonly  known as: EFFEXOR-XR Take 3 capsules (225 mg total) by mouth daily with breakfast.        Objective:   BP 126/78   Pulse 61   Temp (!) 97.4 F (36.3 C)   Ht 6' (1.829 m)   Wt 208 lb 12.8 oz (94.7 kg)   SpO2 100%   BMI 28.32 kg/m   Wt Readings from Last 3 Encounters:  01/20/20 208 lb 12.8 oz (94.7 kg)  12/27/19 210 lb (95.3 kg)  11/11/19 208 lb (94.3 kg)    Physical Exam Vitals and nursing note reviewed.  Constitutional:      Appearance: Normal appearance.  Skin:    Comments: Right inner leg wound on lower leg.  0.1 cm in diameter, still has a slight depth to it but is shrinking in size, no surrounding erythema or warmth  Neurological:     Mental Status: He is alert.       Assessment & Plan:   Problem List Items Addressed This Visit    None    Visit Diagnoses    Leg wound, right, subsequent encounter    -  Primary   Need for immunization against influenza       Relevant Orders   Flu Vaccine  QUAD High Dose(Fluad) (Completed)      Continue daily dressing changes. Follow up plan: Return if symptoms worsen or fail to improve, for 3 to 4-week wound recheck.  Counseling provided for all of the vaccine components No orders of the defined types were placed in this encounter.   Arville Care, MD [facility] 01/20/2020, 9:34 AM

## 2020-05-08 ENCOUNTER — Other Ambulatory Visit: Payer: Self-pay | Admitting: Family Medicine

## 2020-05-08 DIAGNOSIS — M545 Low back pain, unspecified: Secondary | ICD-10-CM

## 2020-05-14 ENCOUNTER — Other Ambulatory Visit: Payer: Self-pay | Admitting: Family Medicine

## 2020-05-14 DIAGNOSIS — F32A Depression, unspecified: Secondary | ICD-10-CM

## 2020-05-14 DIAGNOSIS — F4310 Post-traumatic stress disorder, unspecified: Secondary | ICD-10-CM

## 2020-05-14 DIAGNOSIS — F419 Anxiety disorder, unspecified: Secondary | ICD-10-CM

## 2020-08-03 ENCOUNTER — Other Ambulatory Visit: Payer: Self-pay | Admitting: Family Medicine

## 2020-08-03 DIAGNOSIS — F4310 Post-traumatic stress disorder, unspecified: Secondary | ICD-10-CM

## 2020-08-03 DIAGNOSIS — F32A Depression, unspecified: Secondary | ICD-10-CM

## 2020-08-03 DIAGNOSIS — F419 Anxiety disorder, unspecified: Secondary | ICD-10-CM

## 2020-09-22 ENCOUNTER — Encounter: Payer: Self-pay | Admitting: Family Medicine

## 2020-09-22 DIAGNOSIS — F419 Anxiety disorder, unspecified: Secondary | ICD-10-CM

## 2020-09-22 DIAGNOSIS — F4310 Post-traumatic stress disorder, unspecified: Secondary | ICD-10-CM

## 2020-09-22 DIAGNOSIS — F411 Generalized anxiety disorder: Secondary | ICD-10-CM

## 2020-09-22 MED ORDER — OMEPRAZOLE 40 MG PO CPDR: 40.0000 mg | DELAYED_RELEASE_CAPSULE | Freq: Every day | ORAL | 0 refills | Status: DC

## 2020-09-22 MED ORDER — VENLAFAXINE HCL ER 75 MG PO CP24: 225.0000 mg | ORAL_CAPSULE | Freq: Every day | ORAL | 0 refills | Status: DC

## 2020-09-25 ENCOUNTER — Encounter: Payer: Self-pay | Admitting: Nurse Practitioner

## 2020-09-25 ENCOUNTER — Ambulatory Visit (INDEPENDENT_AMBULATORY_CARE_PROVIDER_SITE_OTHER): Payer: Medicare Other | Admitting: Nurse Practitioner

## 2020-09-25 DIAGNOSIS — U071 COVID-19: Secondary | ICD-10-CM

## 2020-09-25 MED ORDER — NIRMATRELVIR/RITONAVIR (PAXLOVID)TABLET: 3.0000 | ORAL_TABLET | Freq: Two times a day (BID) | ORAL | 0 refills | Status: AC

## 2020-09-25 NOTE — Progress Notes (Signed)
   Virtual Visit  Note Due to COVID-19 pandemic this visit was conducted virtually. This visit type was conducted due to national recommendations for restrictions regarding the COVID-19 Pandemic (e.g. social distancing, sheltering in place) in an effort to limit this patient's exposure and mitigate transmission in our community. All issues noted in this document were discussed and addressed.  A physical exam was not performed with this format.  I connected with Nicholas Yates on 09/25/20 at 12 PM by telephone and verified that I am speaking with the correct person using two identifiers. Nicholas Yates is currently located at home during visit. The provider, Daryll Drown, NP is located in their office at time of visit.  I discussed the limitations, risks, security and privacy concerns of performing an evaluation and management service by telephone and the availability of in person appointments. I also discussed with the patient that there may be a patient responsible charge related to this service. The patient expressed understanding and agreed to proceed.   History and Present Illness:  Cough This is a new problem. The current episode started yesterday. The problem has been gradually worsening. The problem occurs constantly. The cough is Non-productive. Associated symptoms include chills, headaches, nasal congestion and postnasal drip. Pertinent negatives include no ear congestion, ear pain or rash. Nothing (Recent COVID-19 positive) aggravates the symptoms. He has tried nothing for the symptoms.     Review of Systems  Constitutional:  Positive for chills.  HENT:  Positive for postnasal drip. Negative for ear pain.   Respiratory:  Positive for cough.   Skin:  Negative for rash.  Neurological:  Positive for headaches.  All other systems reviewed and are negative.   Observations/Objective: Televisit patient was in distress.  Assessment and Plan: PCR test completed, started patient on  possible weight.  Education provided, advised patient to hold sildenafil until completed approximately.  Patient verbalized understanding follow-up with worsening or unresolved symptoms.  Follow Up Instructions:  follow-up with worsening or unresolved symptoms.   I discussed the assessment and treatment plan with the patient. The patient was provided an opportunity to ask questions and all were answered. The patient agreed with the plan and demonstrated an understanding of the instructions.   The patient was advised to call back or seek an in-person evaluation if the symptoms worsen or if the condition fails to improve as anticipated.  The above assessment and management plan was discussed with the patient. The patient verbalized understanding of and has agreed to the management plan. Patient is aware to call the clinic if symptoms persist or worsen. Patient is aware when to return to the clinic for a follow-up visit. Patient educated on when it is appropriate to go to the emergency department.   Time call ended: 12:10 PM  I provided 10 minutes of  non face-to-face time during this encounter.    Daryll Drown, NP

## 2020-09-25 NOTE — Assessment & Plan Note (Signed)
PCR test completed, started patient on possible weight.  Education provided, advised patient to hold sildenafil until completed approximately.  Patient verbalized understanding follow-up with worsening or unresolved symptoms.

## 2020-09-26 LAB — SARS-COV-2, NAA 2 DAY TAT

## 2020-09-26 LAB — NOVEL CORONAVIRUS, NAA: SARS-CoV-2, NAA: DETECTED — AB

## 2020-10-19 ENCOUNTER — Ambulatory Visit (INDEPENDENT_AMBULATORY_CARE_PROVIDER_SITE_OTHER): Payer: Medicare Other | Admitting: Family Medicine

## 2020-10-19 ENCOUNTER — Other Ambulatory Visit: Payer: Self-pay

## 2020-10-19 ENCOUNTER — Encounter: Payer: Self-pay | Admitting: Family Medicine

## 2020-10-19 VITALS — BP 127/77 | HR 71 | Ht 72.0 in | Wt 194.0 lb

## 2020-10-19 DIAGNOSIS — G8929 Other chronic pain: Secondary | ICD-10-CM

## 2020-10-19 DIAGNOSIS — Z13220 Encounter for screening for lipoid disorders: Secondary | ICD-10-CM | POA: Diagnosis not present

## 2020-10-19 DIAGNOSIS — M545 Low back pain, unspecified: Secondary | ICD-10-CM

## 2020-10-19 DIAGNOSIS — F419 Anxiety disorder, unspecified: Secondary | ICD-10-CM

## 2020-10-19 DIAGNOSIS — F32A Depression, unspecified: Secondary | ICD-10-CM

## 2020-10-19 DIAGNOSIS — F4310 Post-traumatic stress disorder, unspecified: Secondary | ICD-10-CM | POA: Diagnosis not present

## 2020-10-19 DIAGNOSIS — Z125 Encounter for screening for malignant neoplasm of prostate: Secondary | ICD-10-CM

## 2020-10-19 DIAGNOSIS — F411 Generalized anxiety disorder: Secondary | ICD-10-CM

## 2020-10-19 DIAGNOSIS — M479 Spondylosis, unspecified: Secondary | ICD-10-CM

## 2020-10-19 DIAGNOSIS — F339 Major depressive disorder, recurrent, unspecified: Secondary | ICD-10-CM

## 2020-10-19 DIAGNOSIS — R21 Rash and other nonspecific skin eruption: Secondary | ICD-10-CM

## 2020-10-19 DIAGNOSIS — K269 Duodenal ulcer, unspecified as acute or chronic, without hemorrhage or perforation: Secondary | ICD-10-CM

## 2020-10-19 MED ORDER — MELOXICAM 15 MG PO TABS: 15.0000 mg | ORAL_TABLET | Freq: Every day | ORAL | 1 refills | Status: DC

## 2020-10-19 MED ORDER — VENLAFAXINE HCL ER 75 MG PO CP24: 225.0000 mg | ORAL_CAPSULE | Freq: Every day | ORAL | 3 refills | Status: DC

## 2020-10-19 MED ORDER — DICLOFENAC SODIUM 2 % EX SOLN: 1.0000 "application " | Freq: Four times a day (QID) | CUTANEOUS | 3 refills | Status: DC | PRN

## 2020-10-19 MED ORDER — QUETIAPINE FUMARATE 100 MG PO TABS: 100.0000 mg | ORAL_TABLET | Freq: Every day | ORAL | 1 refills | Status: DC

## 2020-10-19 MED ORDER — OMEPRAZOLE 40 MG PO CPDR: 40.0000 mg | DELAYED_RELEASE_CAPSULE | Freq: Every day | ORAL | 3 refills | Status: DC

## 2020-10-19 NOTE — Progress Notes (Signed)
BP 127/77   Pulse 71   Ht 6' (1.829 m)   Wt 194 lb (88 kg)   SpO2 100%   BMI 26.31 kg/m    Subjective:   Patient ID: Nicholas Yates, male    DOB: [birth year], 65 y.o.   MRN: [id number]  HPI: Nicholas Yates is a 65 y.o. male presenting on 10/19/2020 for Back Pain, Neck Pain, and Leg Pain (RLE)   HPI Anxiety and depression and PTSD recheck Patient is currently on Effexor for anxiety and depression and PTSD.  Feels like he is not doing as well and gets anxious and quick to anger and very irritable.  He feels like he is overwhelmed and not sleeping as well and says that his sleep is erratic and sometimes will be 8 hours and sometimes every 4 hours.  He denies any suicidal ideations or thoughts of hurting self Depression screen [facility] 2/9 10/19/2020 01/20/2020 12/27/2019 11/11/2019 10/02/2017  Decreased Interest 2 0 0 0 3  Down, Depressed, Hopeless 1 0 0 0 2  PHQ - 2 Score 3 0 0 0 5  Altered sleeping 2 - - - 3  Tired, decreased energy 2 - - - 3  Change in appetite 0 - - - 3  Feeling bad or failure about yourself  0 - - - 2  Trouble concentrating 1 - - - 3  Moving slowly or fidgety/restless 0 - - - 0  Suicidal thoughts 0 - - - 0  PHQ-9 Score 8 - - - 19  Difficult doing work/chores - - - - -  Some recent data might be hidden    GERD and duodenal ulcer Patient is currently on Prilosec.  She denies any major symptoms or abdominal pain or belching or burping. She denies any blood in her stool or lightheadedness or dizziness.   Back pain and stiffness  Patient has been having back pain, has been bothering him for quite some time.  He has tried topical agents and says that he is taking up to 8 Goody powders a day, with a history of duodenal ulcers episodic good thing for him to be taking.  It does hurt on his right lower back more than his left lower back.  He denies any numbness or weakness.  He does get occasional shooting pains down to his foot that are tingling sensation but is not constant.  His  back is worse when he first gets up from rest and stiff but when she stretches out it does little better.  Also worse when he is on his feet for long periods.  Patient has rash on his right inner lower leg and part of his knee that is scaly plaque that he has tried using cortisone and Aquaphor and CeraVe a on him and it does improve it sometimes and he will come back.  He has been fighting this for quite a few months.  Relevant past medical, surgical, family and social history reviewed and updated as indicated. Interim medical history since our last visit reviewed. Allergies and medications reviewed and updated.  Review of Systems  Constitutional:  Negative for chills and fever.  Respiratory:  Negative for shortness of breath and wheezing.   Cardiovascular:  Negative for chest pain and leg swelling.  Musculoskeletal:  Positive for back pain. Negative for gait problem.  Skin:  Positive for color change and rash.  Psychiatric/Behavioral:  Positive for dysphoric mood and sleep disturbance. Negative for self-injury and suicidal ideas. The patient is nervous/anxious.  All other systems reviewed and are negative.  Per HPI unless specifically indicated above   Allergies as of 10/19/2020   No Known Allergies      Medication List        Accurate as of October 19, 2020  9:58 AM. If you have any questions, ask your nurse or doctor.          STOP taking these medications    diclofenac Sodium 1 % Gel Commonly known as: VOLTAREN Replaced by: Diclofenac Sodium 2 % Soln Stopped by: Fransisca Kaufmann Anaalicia Reimann, MD       TAKE these medications    Diclofenac Sodium 2 % Soln Commonly known as: Pennsaid Apply 1 application topically 4 (four) times daily as needed. Replaces: diclofenac Sodium 1 % Gel Started by: Fransisca Kaufmann Damya Comley, MD   meloxicam 15 MG tablet Commonly known as: MOBIC Take 1 tablet (15 mg total) by mouth daily. Started by: Worthy Rancher, MD   omeprazole 40 MG  capsule Commonly known as: PRILOSEC Take 1 capsule (40 mg total) by mouth daily.   QUEtiapine 100 MG tablet Commonly known as: SEROquel Take 1 tablet (100 mg total) by mouth at bedtime. Started by: Worthy Rancher, MD   venlafaxine XR 75 MG 24 hr capsule Commonly known as: EFFEXOR-XR Take 3 capsules (225 mg total) by mouth daily with breakfast. What changed: additional instructions Changed by: Fransisca Kaufmann Amanat Hackel, MD         Objective:   BP 127/77   Pulse 71   Ht 6' (1.829 m)   Wt 194 lb (88 kg)   SpO2 100%   BMI 26.31 kg/m   Wt Readings from Last 3 Encounters:  10/19/20 194 lb (88 kg)  01/20/20 208 lb 12.8 oz (94.7 kg)  12/27/19 210 lb (95.3 kg)    Physical Exam Vitals and nursing note reviewed.  Constitutional:      General: He is not in acute distress.    Appearance: He is well-developed. He is not diaphoretic.  Eyes:     General: No scleral icterus.    Conjunctiva/sclera: Conjunctivae normal.  Neck:     Thyroid: No thyromegaly.  Cardiovascular:     Rate and Rhythm: Normal rate and regular rhythm.     Heart sounds: Normal heart sounds. No murmur heard. Pulmonary:     Effort: Pulmonary effort is normal. No respiratory distress.     Breath sounds: Normal breath sounds. No wheezing.  Musculoskeletal:        General: No swelling.     Cervical back: Neck supple.     Lumbar back: Tenderness (Paraspinal tenderness in lumbar region) present. No deformity or bony tenderness. Normal range of motion. Negative right straight leg raise test and negative left straight leg raise test.  Lymphadenopathy:     Cervical: No cervical adenopathy.  Skin:    General: Skin is warm and dry.     Findings: Rash (3 large scaly plaques on the right lower leg, no erythema or warmth) present.  Neurological:     Mental Status: He is alert and oriented to person, place, and time.     Coordination: Coordination normal.  Psychiatric:        Behavior: Behavior normal.       Assessment & Plan:   Problem List Items Addressed This Visit       Digestive   Duodenal ulcer     Other   Depression, recurrent ([place])   Relevant Medications   venlafaxine XR (  EFFEXOR-XR) 75 MG 24 hr capsule   PTSD (post-traumatic stress disorder) - Primary   Relevant Medications   venlafaxine XR (EFFEXOR-XR) 75 MG 24 hr capsule   Generalized anxiety disorder   Relevant Medications   venlafaxine XR (EFFEXOR-XR) 75 MG 24 hr capsule   omeprazole (PRILOSEC) 40 MG capsule   Other Visit Diagnoses     Anxiety and depression       Relevant Medications   venlafaxine XR (EFFEXOR-XR) 75 MG 24 hr capsule   Lipid screening       Relevant Orders   CBC with Differential/Platelet   CMP14+EGFR   Lipid panel   TSH   PSA, total and free   Prostate cancer screening       Chronic bilateral low back pain without sciatica       Relevant Medications   Diclofenac Sodium (PENNSAID) 2 % SOLN   meloxicam (MOBIC) 15 MG tablet   Other Relevant Orders   Ambulatory referral to Physical Therapy   Osteoarthritis of lower back       Relevant Medications   Diclofenac Sodium (PENNSAID) 2 % SOLN   meloxicam (MOBIC) 15 MG tablet   Other Relevant Orders   Ambulatory referral to Physical Therapy   Scaly patch rash           Referral to physical therapy and will try meloxicam, said no more Goody powders And will switch to pen said because he was breaking out with the Voltaren gel and see if that does not work but better for him.  If physical therapy does not work, may consider doing imaging in the future.  Add Seroquel in the evening to see if that helps with his anxiety and irritability better.   Follow up plan: Return in about 4 weeks (around 11/16/2020), or if symptoms worsen or fail to improve, for Anxiety depression recheck.  Counseling provided for all of the vaccine components Orders Placed This Encounter  Procedures   CBC with Differential/Platelet   CMP14+EGFR   Lipid panel   TSH    PSA, total and free   Ambulatory referral to Physical Therapy    Caryl Pina, MD [facility] 10/19/2020, 9:58 AM

## 2020-10-20 ENCOUNTER — Other Ambulatory Visit: Payer: Self-pay | Admitting: Family

## 2020-10-20 ENCOUNTER — Other Ambulatory Visit: Payer: Self-pay | Admitting: Family Medicine

## 2020-10-20 DIAGNOSIS — E875 Hyperkalemia: Secondary | ICD-10-CM

## 2020-10-20 LAB — CMP14+EGFR
ALT: 17 IU/L (ref 0–44)
AST: 24 IU/L (ref 0–40)
Albumin/Globulin Ratio: 1.4 (ref 1.2–2.2)
Albumin: 4.4 g/dL (ref 3.8–4.8)
Alkaline Phosphatase: 125 IU/L — ABNORMAL HIGH (ref 44–121)
BUN/Creatinine Ratio: 20 (ref 10–24)
BUN: 19 mg/dL (ref 8–27)
Bilirubin Total: <0.2 mg/dL (ref 0.0–1.2)
CO2: 24 mmol/L (ref 20–29)
Calcium: 9.4 mg/dL (ref 8.6–10.2)
Chloride: 105 mmol/L (ref 96–106)
Creatinine, Ser: 0.96 mg/dL (ref 0.76–1.27)
Globulin, Total: 3.1 g/dL (ref 1.5–4.5)
Glucose: 96 mg/dL (ref 65–99)
Potassium: 5.9 mmol/L (ref 3.5–5.2)
Sodium: 142 mmol/L (ref 134–144)
Total Protein: 7.5 g/dL (ref 6.0–8.5)
eGFR: 87 mL/min/{1.73_m2} (ref >59)

## 2020-10-20 LAB — CBC WITH DIFFERENTIAL/PLATELET
Basophils Absolute: 0.1 10*3/uL (ref 0.0–0.2)
Basos: 1 %
EOS (ABSOLUTE): 0.3 10*3/uL (ref 0.0–0.4)
Eos: 4 %
Hematocrit: 39.7 % (ref 37.5–51.0)
Hemoglobin: 13.8 g/dL (ref 13.0–17.7)
Immature Grans (Abs): 0 10*3/uL (ref 0.0–0.1)
Immature Granulocytes: 0 %
Lymphocytes Absolute: 1.4 10*3/uL (ref 0.7–3.1)
Lymphs: 21 %
MCH: 32.9 pg (ref 26.6–33.0)
MCHC: 34.8 g/dL (ref 31.5–35.7)
MCV: 95 fL (ref 79–97)
Monocytes Absolute: 0.6 10*3/uL (ref 0.1–0.9)
Monocytes: 8 %
Neutrophils Absolute: 4.4 10*3/uL (ref 1.4–7.0)
Neutrophils: 66 %
Platelets: 381 10*3/uL (ref 150–450)
RBC: 4.2 x10E6/uL (ref 4.14–5.80)
RDW: 11.9 % (ref 11.6–15.4)
WBC: 6.8 10*3/uL (ref 3.4–10.8)

## 2020-10-20 LAB — LIPID PANEL
Chol/HDL Ratio: 2.3 ratio (ref 0.0–5.0)
Cholesterol, Total: 166 mg/dL (ref 100–199)
HDL: 73 mg/dL (ref >39)
LDL Chol Calc (NIH): 81 mg/dL (ref 0–99)
Triglycerides: 61 mg/dL (ref 0–149)
VLDL Cholesterol Cal: 12 mg/dL (ref 5–40)

## 2020-10-20 LAB — PSA, TOTAL AND FREE
PSA, Free Pct: 35 %
PSA, Free: 0.14 ng/mL
Prostate Specific Ag, Serum: 0.4 ng/mL (ref 0.0–4.0)

## 2020-10-20 LAB — TSH: TSH: 1.48 u[IU]/mL (ref 0.450–4.500)

## 2020-10-20 MED ORDER — ROSUVASTATIN CALCIUM 10 MG PO TABS: 10.0000 mg | ORAL_TABLET | Freq: Every day | ORAL | 1 refills | Status: DC

## 2020-10-21 ENCOUNTER — Other Ambulatory Visit: Payer: Self-pay

## 2020-10-21 ENCOUNTER — Other Ambulatory Visit: Payer: Medicare Other

## 2020-10-22 LAB — BMP8+EGFR
BUN/Creatinine Ratio: 20 (ref 10–24)
BUN: 21 mg/dL (ref 8–27)
CO2: 19 mmol/L — ABNORMAL LOW (ref 20–29)
Calcium: 9 mg/dL (ref 8.6–10.2)
Chloride: 102 mmol/L (ref 96–106)
Creatinine, Ser: 1.04 mg/dL (ref 0.76–1.27)
Glucose: 118 mg/dL — ABNORMAL HIGH (ref 65–99)
Potassium: 3.9 mmol/L (ref 3.5–5.2)
Sodium: 138 mmol/L (ref 134–144)
eGFR: 79 mL/min/{1.73_m2} (ref >59)

## 2020-11-02 ENCOUNTER — Ambulatory Visit: Payer: Medicare Other | Attending: Family Medicine | Admitting: Physical Therapy

## 2020-11-02 ENCOUNTER — Encounter: Payer: Self-pay | Admitting: Physical Therapy

## 2020-11-02 DIAGNOSIS — R293 Abnormal posture: Secondary | ICD-10-CM | POA: Diagnosis present

## 2020-11-02 DIAGNOSIS — M545 Low back pain, unspecified: Secondary | ICD-10-CM | POA: Insufficient documentation

## 2020-11-02 DIAGNOSIS — G8929 Other chronic pain: Secondary | ICD-10-CM | POA: Diagnosis present

## 2020-11-02 NOTE — Therapy (Signed)
[facility] [address] [place], [place], [address] Phone: [phone]   Fax:  [phone]  Physical Therapy Evaluation  Patient Details  Name: Nicholas Yates MRN: [id number] Date of Birth: Jun 24, 1955 Referring Provider (PT): Arville Care MD   Encounter Date: 11/02/2020   PT End of Session - 11/02/20 1455     Visit Number 1    Number of Visits 12    Date for PT Re-Evaluation 12/14/20    PT Start Time 0105    PT Stop Time 0145    PT Time Calculation (min) 40 min    Activity Tolerance Patient tolerated treatment well    Behavior During Therapy [facility] for tasks assessed/performed             Past Medical History:  Diagnosis Date   Depression    GERD (gastroesophageal reflux disease)    had duodenal ulcer 5 months   Post traumatic stress disorder     Past Surgical History:  Procedure Laterality Date   HERNIA REPAIR     TOTAL HIP ARTHROPLASTY     bilateral    There were no vitals filed for this visit.    Subjective Assessment - 11/02/20 1427     Subjective COVID-19 screen performed prior to patient entering clinic.  The patient presents to the clinic today with chronic low back pain over the last 6 years but a major exacerbation when he fell backward on the ledge into his shower.  He fell directly on his low back and had to have help from his wife to get up. His pain-level is a 6/10 today and he states he has a high pain tolerance.  Prolonged walking increase his pain.  Rest decreases his pain.    Pertinent History Skin condition on right LE (plans to see a dermalogist), bilateral THA's, hernia repair, PTSD.    How long can you walk comfortably? Varies, but the longer he walks the worse his pain gets.    Patient Stated Goals Reduce pain.    Currently in Pain? Yes    Pain Score 6     Pain Location Back    Pain Orientation Mid    Pain Descriptors / Indicators Sore    Pain Type Chronic pain    Pain Onset More than a  month ago    Pain Frequency Constant    Aggravating Factors  See above.    Pain Relieving Factors See above.                [facility] PT Assessment - 11/02/20 0001       Assessment   Medical Diagnosis Chronic bilateral low back pain without sciatica    Referring Provider (PT) Ivin Booty Dettinger MD    Onset Date/Surgical Date --   6 year with exacerbation about a year ago.     Precautions   Precautions None      Restrictions   Weight Bearing Restrictions No      Balance Screen   Has the patient fallen in the past 6 months Yes    How many times? 4.    Has the patient had a decrease in activity level because of a fear of falling?  No    Is the patient reluctant to leave their home because of a fear of falling?  No      Home Nurse, mental health Private residence      Posture/Postural Control   Posture/Postural Control Postural limitations  Postural Limitations Rounded Shoulders;Forward head;Decreased lumbar lordosis    Posture Comments Left LE shorter.  Has shoe insert      ROM / Strength   AROM / PROM / Strength AROM;Strength      AROM   Overall AROM Comments Functional active lumbar range of motion.      Strength   Overall Strength Comments Bilateral LE strength is essentially normal.      Palpation   Palpation comment Most palpable pain present with overpressure at L5-S1.      Special Tests   Other special tests (-) SLR testing.  Left LE shorter than right .                        Objective measurements completed on examination: See above findings.       [facility] Adult PT Treatment/Exercise - 11/02/20 0001       Modalities   Modalities Electrical Stimulation;Moist Heat      Moist Heat Therapy   Number Minutes Moist Heat 15 Minutes    Moist Heat Location Lumbar Spine      Electrical Stimulation   Electrical Stimulation Location Lower lumbar    Electrical Stimulation Action IFC at 80-150 Hz.    Electrical Stimulation  Parameters 40% scan x 15 minutes.    Electrical Stimulation Goals Pain                         PT Long Term Goals - 11/02/20 1528       PT LONG TERM GOAL #1   Title Independent with a HEP.    Time 6    Period Weeks    Status New      PT LONG TERM GOAL #2   Title Walk a mile a 1/2 mile with pain not > 3/10.    Time 6    Period Weeks    Status New      PT LONG TERM GOAL #3   Title Perform ADL's with pain not > 3/10.    Time 6    Period Weeks    Status New                    Plan - 11/02/20 1329     Clinical Impression Statement The patient presents to [facility] with c/o chronic low back pain with a significant exacerbation when he fell backward theh ledge into his shower.  He was tender to palpation with overpressure at L5-S1.  He demonstrates negative SLR testing.  His left LE is shorter which he reports was due to his THA.  Prolonged walking increases his pain.  His LE strength is essentially normal.  Patient will benefit from skilled physical therapy intervention to address pain and deficits.    Personal Factors and Comorbidities Comorbidity 1;Other    Comorbidities Skin condition on right LE (plans to see a dermalogist), bilateral THA's, hernia repair, PTSD.    Examination-Activity Limitations Other;Locomotion Level    Examination-Participation Restrictions Other    Stability/Clinical Decision Making Stable/Uncomplicated    Clinical Decision Making Low    Rehab Potential Good    PT Frequency 2x / week    PT Duration 6 weeks    PT Treatment/Interventions ADLs/Self Care Home Management;Cryotherapy;Electrical Stimulation;Ultrasound;Moist Heat;Therapeutic activities;Therapeutic exercise;Manual techniques;Patient/family education;Passive range of motion;Dry needling    PT Next Visit Plan PLEASE DO FOTO INTAKE.  Core exercise progression.  Modalities and STW/M as needed.  Consulted and Agree with Plan of Care Patient             Patient will benefit  from skilled therapeutic intervention in order to improve the following deficits and impairments:  Pain  Visit Diagnosis: Chronic midline low back pain without sciatica - Plan: PT plan of care cert/re-cert  Abnormal posture - Plan: PT plan of care cert/re-cert     Problem List Patient Active Problem List   Diagnosis Date Noted   Extrapyramidal symptom 10/04/2018   Bipolar 2 disorder (HCC) 08/31/2018   ED (erectile dysfunction) 08/31/2018   Generalized anxiety disorder 01/20/2017   Depression, recurrent (HCC) 07/07/2016   PTSD (post-traumatic stress disorder) 07/07/2016   Duodenal ulcer 07/07/2016    Bryleigh Ottaway, [place] MPT 11/02/2020, 3:31 PM  [facility] [address] [place], [place], [address] Phone: [phone]   Fax:  [phone]  Name: Nicholas Yates MRN: [id number] Date of Birth: [birth year]

## 2020-11-09 ENCOUNTER — Encounter: Payer: Self-pay | Admitting: Family Medicine

## 2020-11-11 ENCOUNTER — Encounter: Payer: Self-pay | Admitting: Family Medicine

## 2020-11-11 ENCOUNTER — Other Ambulatory Visit: Payer: Self-pay

## 2020-11-11 ENCOUNTER — Ambulatory Visit (INDEPENDENT_AMBULATORY_CARE_PROVIDER_SITE_OTHER): Payer: Medicare Other | Admitting: Family Medicine

## 2020-11-11 VITALS — BP 148/73 | HR 74 | Ht 72.0 in | Wt 194.0 lb

## 2020-11-11 DIAGNOSIS — M533 Sacrococcygeal disorders, not elsewhere classified: Secondary | ICD-10-CM

## 2020-11-11 MED ORDER — METHYLPREDNISOLONE ACETATE 40 MG/ML IJ SUSP: 80.0000 mg | Freq: Once | INTRAMUSCULAR | Status: DC

## 2020-11-11 NOTE — Progress Notes (Signed)
BP (!) 148/73   Pulse 74   Ht 6' (1.829 m)   Wt 194 lb (88 kg)   SpO2 99%   BMI 26.31 kg/m    Subjective:   Patient ID: Nicholas Yates, male    DOB: Apr 25, 1955, 65 y.o.   MRN: [id number]  HPI: Nicholas Yates is a 65 y.o. male presenting on 11/11/2020 for Back Pain   HPI Patient is coming in today with complaints of left lower back pain.  He says been hurting for a couple weeks.  He has been trying to meloxicam and diclofenac gel and just feels like they are not helping as much.  The gel does help more but it does cause him to break out.  He says he was doing a lot better up until couple weeks ago when he lifted something heavy and started hurting again and that hard of his back.  He says it does not radiate down his leg and has no numbness or weakness.  He says it is worse when he initially tries to stand up.  He says it is very stiff and then loosens up as he is walking around more.  He says he feels like he stumbles more because of the pain.  He he says hot shower does help loosen up as well so is more loose and not as stiff.  Relevant past medical, surgical, family and social history reviewed and updated as indicated. Interim medical history since our last visit reviewed. Allergies and medications reviewed and updated.  Review of Systems  Constitutional:  Negative for chills and fever.  Respiratory:  Negative for shortness of breath and wheezing.   Cardiovascular:  Negative for chest pain and leg swelling.  Musculoskeletal:  Positive for arthralgias, back pain and gait problem.  Skin:  Negative for rash.  All other systems reviewed and are negative.  Per HPI unless specifically indicated above   Allergies as of 11/11/2020   No Known Allergies      Medication List        Accurate as of November 11, 2020  9:42 AM. If you have any questions, ask your nurse or doctor.          Diclofenac Sodium 2 % Soln Commonly known as: Pennsaid Apply 1 application topically 4 (four)  times daily as needed.   meloxicam 15 MG tablet Commonly known as: MOBIC Take 1 tablet (15 mg total) by mouth daily.   omeprazole 40 MG capsule Commonly known as: PRILOSEC Take 1 capsule (40 mg total) by mouth daily.   QUEtiapine 100 MG tablet Commonly known as: SEROquel Take 1 tablet (100 mg total) by mouth at bedtime.   rosuvastatin 10 MG tablet Commonly known as: Crestor Take 1 tablet (10 mg total) by mouth daily.   venlafaxine XR 75 MG 24 hr capsule Commonly known as: EFFEXOR-XR Take 3 capsules (225 mg total) by mouth daily with breakfast.         Objective:   BP (!) 148/73   Pulse 74   Ht 6' (1.829 m)   Wt 194 lb (88 kg)   SpO2 99%   BMI 26.31 kg/m   Wt Readings from Last 3 Encounters:  11/11/20 194 lb (88 kg)  10/19/20 194 lb (88 kg)  01/20/20 208 lb 12.8 oz (94.7 kg)    Physical Exam Vitals and nursing note reviewed.  Constitutional:      General: He is not in acute distress.    Appearance: He is well-developed. He  is not diaphoretic.  Eyes:     General: No scleral icterus.    Conjunctiva/sclera: Conjunctivae normal.  Musculoskeletal:        General: Normal range of motion.     Lumbar back: Tenderness present. No spasms. Normal range of motion. Negative right straight leg raise test and negative left straight leg raise test.       Back:  Skin:    General: Skin is warm and dry.     Findings: No rash.  Neurological:     Mental Status: He is alert and oriented to person, place, and time.     Coordination: Coordination normal.  Psychiatric:        Behavior: Behavior normal.      Assessment & Plan:   Problem List Items Addressed This Visit   None Visit Diagnoses     SI (sacroiliac) joint dysfunction    -  Primary   Relevant Medications   methylPREDNISolone acetate (DEPO-MEDROL) injection 80 mg (Start on 11/11/2020  9:45 AM)       Will give steroid injection and will continue with physical therapy.  If not improved may do oral course of  steroids in the future.  May also consider another injection in the future if not improved Follow up plan: Return if symptoms worsen or fail to improve.  Counseling provided for all of the vaccine components No orders of the defined types were placed in this encounter.   Arville Care, MD [facility] 11/11/2020, 9:42 AM

## 2020-11-16 ENCOUNTER — Encounter: Payer: Self-pay | Admitting: Family Medicine

## 2020-11-16 MED ORDER — PREDNISONE 20 MG PO TABS: ORAL_TABLET | ORAL | 0 refills | Status: DC

## 2020-12-07 ENCOUNTER — Encounter: Payer: Self-pay | Admitting: Family Medicine

## 2020-12-07 ENCOUNTER — Ambulatory Visit (INDEPENDENT_AMBULATORY_CARE_PROVIDER_SITE_OTHER): Payer: Medicare Other | Admitting: Family Medicine

## 2020-12-07 ENCOUNTER — Other Ambulatory Visit: Payer: Self-pay

## 2020-12-07 VITALS — BP 137/77 | HR 72 | Ht 72.0 in | Wt 199.0 lb

## 2020-12-07 DIAGNOSIS — F419 Anxiety disorder, unspecified: Secondary | ICD-10-CM

## 2020-12-07 DIAGNOSIS — M479 Spondylosis, unspecified: Secondary | ICD-10-CM | POA: Diagnosis not present

## 2020-12-07 DIAGNOSIS — F339 Major depressive disorder, recurrent, unspecified: Secondary | ICD-10-CM

## 2020-12-07 DIAGNOSIS — F32A Depression, unspecified: Secondary | ICD-10-CM

## 2020-12-07 DIAGNOSIS — N529 Male erectile dysfunction, unspecified: Secondary | ICD-10-CM

## 2020-12-07 DIAGNOSIS — G8929 Other chronic pain: Secondary | ICD-10-CM

## 2020-12-07 DIAGNOSIS — F4310 Post-traumatic stress disorder, unspecified: Secondary | ICD-10-CM | POA: Diagnosis not present

## 2020-12-07 DIAGNOSIS — F411 Generalized anxiety disorder: Secondary | ICD-10-CM

## 2020-12-07 DIAGNOSIS — M545 Low back pain, unspecified: Secondary | ICD-10-CM

## 2020-12-07 MED ORDER — METHYLPREDNISOLONE ACETATE 40 MG/ML IJ SUSP
80.0000 mg | Freq: Once | INTRAMUSCULAR | Status: AC
  Administered 2020-12-07: 80 mg via INTRAMUSCULAR

## 2020-12-07 MED ORDER — MELOXICAM 15 MG PO TABS: 15.0000 mg | ORAL_TABLET | Freq: Every day | ORAL | 3 refills | Status: DC

## 2020-12-07 MED ORDER — TADALAFIL 10 MG PO TABS: 10.0000 mg | ORAL_TABLET | Freq: Every day | ORAL | 3 refills | Status: DC | PRN

## 2020-12-07 MED ORDER — VENLAFAXINE HCL ER 75 MG PO CP24: 225.0000 mg | ORAL_CAPSULE | Freq: Every day | ORAL | 3 refills | Status: DC

## 2020-12-07 NOTE — Progress Notes (Signed)
BP 137/77   Pulse 72   Ht 6' (1.829 m)   Wt 199 lb (90.3 kg)   SpO2 98%   BMI 26.99 kg/m    Subjective:   Patient ID: Nicholas Yates, male    DOB: 02-Aug-1955, 65 y.o.   MRN: [id number]  HPI: Nicholas Yates is a 65 y.o. male presenting on 12/07/2020 for Medical Management of Chronic Issues and Anxiety   HPI Anxiety and depression Patient is currently taking Seroquel and Effexor although he does not take the Seroquel every day because sometimes he has to get up very early in the morning to go to work and does not take it on those days.  He does feel like he does well on these medicines most of the times but he is starting to get to the point where he wants to be retired and is not quite there and so the stress and especially when he has to work 16 days straight like he has the last 16 days.  He does have a vacation coming up soon and is very excited about that. Depression screen [facility] 2/9 12/07/2020 10/19/2020 01/20/2020 12/27/2019 11/11/2019  Decreased Interest 1 2 0 0 0  Down, Depressed, Hopeless 1 1 0 0 0  PHQ - 2 Score 2 3 0 0 0  Altered sleeping 1 2 - - -  Tired, decreased energy 1 2 - - -  Change in appetite 1 0 - - -  Feeling bad or failure about yourself  0 0 - - -  Trouble concentrating 0 1 - - -  Moving slowly or fidgety/restless 0 0 - - -  Suicidal thoughts 0 0 - - -  PHQ-9 Score 5 8 - - -  Difficult doing work/chores - - - - -  Some recent data might be hidden    Patient is coming for an erectile dysfunction symptom and macro generic did not work and is wondering if something else he could try.  He does limit that he does drink alcohol sometimes recommended against that and to stay hydrated or increase fluid intake.  Patient has been having continued low back pain and wants to do another injection.  He continues to take meloxicam and it is helping but he says lines he is out of work where he has to lift a lot of boxes he knows he is going have some issues.  He is planning on  retiring in June of next year about 9 months from now.  Relevant past medical, surgical, family and social history reviewed and updated as indicated. Interim medical history since our last visit reviewed. Allergies and medications reviewed and updated.  Review of Systems  Constitutional:  Negative for chills and fever.  Eyes:  Negative for visual disturbance.  Respiratory:  Negative for shortness of breath and wheezing.   Cardiovascular:  Negative for chest pain and leg swelling.  Skin:  Negative for rash.  Neurological:  Negative for dizziness and light-headedness.  All other systems reviewed and are negative.  Per HPI unless specifically indicated above   Allergies as of 12/07/2020   No Known Allergies      Medication List        Accurate as of December 07, 2020 10:33 AM. If you have any questions, ask your nurse or doctor.          STOP taking these medications    predniSONE 20 MG tablet Commonly known as: DELTASONE Stopped by: Nils Pyle, MD  TAKE these medications    Diclofenac Sodium 2 % Soln Commonly known as: Pennsaid Apply 1 application topically 4 (four) times daily as needed.   meloxicam 15 MG tablet Commonly known as: MOBIC Take 1 tablet (15 mg total) by mouth daily.   omeprazole 40 MG capsule Commonly known as: PRILOSEC Take 1 capsule (40 mg total) by mouth daily.   QUEtiapine 100 MG tablet Commonly known as: SEROquel Take 1 tablet (100 mg total) by mouth at bedtime.   rosuvastatin 10 MG tablet Commonly known as: Crestor Take 1 tablet (10 mg total) by mouth daily.   tadalafil 10 MG tablet Commonly known as: CIALIS Take 1 tablet (10 mg total) by mouth daily as needed for erectile dysfunction. Started by: Nils Pyle, MD   venlafaxine XR 75 MG 24 hr capsule Commonly known as: EFFEXOR-XR Take 3 capsules (225 mg total) by mouth daily with breakfast.         Objective:   BP 137/77   Pulse 72   Ht 6' (1.829 m)    Wt 199 lb (90.3 kg)   SpO2 98%   BMI 26.99 kg/m   Wt Readings from Last 3 Encounters:  12/07/20 199 lb (90.3 kg)  11/11/20 194 lb (88 kg)  10/19/20 194 lb (88 kg)    Physical Exam Vitals and nursing note reviewed.  Constitutional:      General: He is not in acute distress.    Appearance: He is well-developed. He is not diaphoretic.  Eyes:     General: No scleral icterus.    Conjunctiva/sclera: Conjunctivae normal.  Neck:     Thyroid: No thyromegaly.  Cardiovascular:     Rate and Rhythm: Normal rate and regular rhythm.     Heart sounds: Normal heart sounds. No murmur heard. Pulmonary:     Effort: Pulmonary effort is normal. No respiratory distress.     Breath sounds: Normal breath sounds. No wheezing.  Musculoskeletal:        General: Normal range of motion.     Cervical back: Neck supple.  Lymphadenopathy:     Cervical: No cervical adenopathy.  Skin:    General: Skin is warm and dry.     Findings: No rash.  Neurological:     Mental Status: He is alert and oriented to person, place, and time.     Coordination: Coordination normal.  Psychiatric:        Behavior: Behavior normal.      Assessment & Plan:   Problem List Items Addressed This Visit       Other   Depression, recurrent (HCC) - Primary   Relevant Medications   venlafaxine XR (EFFEXOR-XR) 75 MG 24 hr capsule   PTSD (post-traumatic stress disorder)   Relevant Medications   venlafaxine XR (EFFEXOR-XR) 75 MG 24 hr capsule   Generalized anxiety disorder   Relevant Medications   venlafaxine XR (EFFEXOR-XR) 75 MG 24 hr capsule   ED (erectile dysfunction)   Relevant Medications   tadalafil (CIALIS) 10 MG tablet   Other Visit Diagnoses     Chronic bilateral low back pain without sciatica       Relevant Medications   meloxicam (MOBIC) 15 MG tablet   methylPREDNISolone acetate (DEPO-MEDROL) injection 80 mg   Osteoarthritis of lower back       Relevant Medications   meloxicam (MOBIC) 15 MG tablet    methylPREDNISolone acetate (DEPO-MEDROL) injection 80 mg   Anxiety and depression       Relevant Medications  venlafaxine XR (EFFEXOR-XR) 75 MG 24 hr capsule       Contine current medication, no major changes. Follow up plan: Return in about 3 months (around 03/08/2021), or if symptoms worsen or fail to improve, for Depression and anxiety recheck.  Counseling provided for all of the vaccine components No orders of the defined types were placed in this encounter.   Arville Care, MD [facility] 12/07/2020, 10:33 AM

## 2021-04-01 ENCOUNTER — Encounter: Payer: Self-pay | Admitting: Family Medicine

## 2021-04-02 ENCOUNTER — Encounter: Payer: Self-pay | Admitting: Family Medicine

## 2021-04-02 ENCOUNTER — Ambulatory Visit (INDEPENDENT_AMBULATORY_CARE_PROVIDER_SITE_OTHER): Payer: Medicare Other | Admitting: Family Medicine

## 2021-04-02 VITALS — BP 129/75 | HR 82 | Ht 72.0 in | Wt 204.0 lb

## 2021-04-02 DIAGNOSIS — L309 Dermatitis, unspecified: Secondary | ICD-10-CM

## 2021-04-02 MED ORDER — PREDNISONE 20 MG PO TABS: ORAL_TABLET | ORAL | 0 refills | Status: DC

## 2021-04-02 MED ORDER — FLUCONAZOLE 150 MG PO TABS: 150.0000 mg | ORAL_TABLET | ORAL | 0 refills | Status: DC

## 2021-04-02 MED ORDER — TRIAMCINOLONE ACETONIDE 0.5 % EX OINT: 1.0000 "application " | TOPICAL_OINTMENT | Freq: Two times a day (BID) | CUTANEOUS | 0 refills | Status: DC

## 2021-04-02 NOTE — Progress Notes (Signed)
Nicholas Yates is a 66 y.o. male who presents with scaly flaky skin on his right leg from his knee to his foot. The leg is also swollen and painful to the touch in some places. He states this started nearly a year ago after a dog bite and has recently gotten worse. The pain is a new symptom and the patches are enlarging. Entirely new patches are also forming. Also a few nights ago the skin had broken and blood was spurting out of a small wound in the leg. He has used antibiotic creams as well as Cerave and neither seem to help the scaling. Tomko notes that after having a steroid shot in his back the lesions got slightly better.  ROS Admits to swelling, pain with palpation, itching. Denies all other symptoms.  Vitals are as follows: BP - 129/75, HR - 82, O2 - 95, Wt. - 204 lbs, Ht. - 72 in.  PE Skin is dry and flaky with slight pain on palpation. Edema is also present.  A&P Dermatitis. Prescribe fluconazole 150 mg once weekly; prednisone 20 mg 3 tabs daily for 1 week, then 2 tabs daily for week 2, then 1 tab daily for week; triamcinolone ointment 0.5% apply twice daily; and refer to dermatology.  Jetta Lout, PA Student 04/02/21  Patient seen and examined with PA student, agree with assessment and plan above. Arville Care, MD [facility] 04/11/2021, 8:42 PM

## 2021-04-02 NOTE — Telephone Encounter (Signed)
I have called and spoke with [name] and she is going to go to patients work and see if he is able to take the 11:10am appt with [facility]. They will call us back and let us know. I did discuss the importance of the visit especially with his leg swelling and his calf being painful. She verbalized understanding and states she would try to get him here for the appointment. I also tried to contact patient but his phone was not set up to receive voicemails.

## 2021-04-02 NOTE — Progress Notes (Signed)
BP 129/75    Pulse 82    Ht 6' (1.829 m)    Wt 204 lb (92.5 kg)    SpO2 95%    BMI 27.67 kg/m    Subjective:   Patient ID: Nicholas Yates, male    DOB: [birth year], 66 y.o.   MRN: [id number]  HPI: Nicholas Yates is a 66 y.o. male presenting on 04/02/2021 for Leg Pain (Swelling, red, scales)   HPI Patient has swelling and pain in right lower extremity with scaly patches and weeping. He has used neosporin and lotions and it is not improving.  His skin cracked and had blood 2 nights ago.  He says this all started initially since the dog bite but has run up and down his leg since that time.  He has used topical creams and antibiotic creams and survey and it does not seem to be improving.  And then it will crack sometimes and bleed and it seems to be spreading up and down his leg.  He thinks initially it started from a dog bite that he had just over 10 months ago and has spread from there.  He says it can be painful sometimes but is not necessarily painful all of the time, just when it starts cracking.  He also feels swelling in that leg which has been there around that time as well.  Relevant past medical, surgical, family and social history reviewed and updated as indicated. Interim medical history since our last visit reviewed. Allergies and medications reviewed and updated.  Review of Systems  Constitutional:  Negative for chills and fever.  Respiratory:  Negative for shortness of breath and wheezing.   Cardiovascular:  Negative for chest pain and leg swelling.  Skin:  Positive for color change, rash and wound.  All other systems reviewed and are negative.  Per HPI unless specifically indicated above   Allergies as of 04/02/2021   No Known Allergies      Medication List        Accurate as of April 02, 2021 12:06 PM. If you have any questions, ask your nurse or doctor.          Diclofenac Sodium 2 % Soln Commonly known as: Pennsaid Apply 1 application topically 4 (four) times  daily as needed.   fluconazole 150 MG tablet Commonly known as: Diflucan Take 1 tablet (150 mg total) by mouth once a week. Started by: Nils Pyle, MD   meloxicam 15 MG tablet Commonly known as: MOBIC Take 1 tablet (15 mg total) by mouth daily.   omeprazole 40 MG capsule Commonly known as: PRILOSEC Take 1 capsule (40 mg total) by mouth daily.   predniSONE 20 MG tablet Commonly known as: DELTASONE Take 3 tabs daily for 1 week, then 2 tabs daily for week 2, then 1 tab daily for week 3. Started by: Elige Radon Aristotle Lieb, MD   QUEtiapine 100 MG tablet Commonly known as: SEROquel Take 1 tablet (100 mg total) by mouth at bedtime.   rosuvastatin 10 MG tablet Commonly known as: Crestor Take 1 tablet (10 mg total) by mouth daily.   tadalafil 10 MG tablet Commonly known as: CIALIS Take 1 tablet (10 mg total) by mouth daily as needed for erectile dysfunction.   triamcinolone ointment 0.5 % Commonly known as: KENALOG Apply 1 application topically 2 (two) times daily. Started by: Nils Pyle, MD   venlafaxine XR 75 MG 24 hr capsule Commonly known as: EFFEXOR-XR Take 3 capsules (225 mg  total) by mouth daily with breakfast.         Objective:   BP 129/75    Pulse 82    Ht 6' (1.829 m)    Wt 204 lb (92.5 kg)    SpO2 95%    BMI 27.67 kg/m   Wt Readings from Last 3 Encounters:  04/02/21 204 lb (92.5 kg)  12/07/20 199 lb (90.3 kg)  11/11/20 194 lb (88 kg)    Physical Exam Vitals and nursing note reviewed.  Constitutional:      Appearance: Normal appearance.  Musculoskeletal:        General: Swelling (3+ lower extremity edema in the right leg) present.  Skin:    General: Skin is warm and dry.     Findings: Rash (Scaly rash with cracking of the skin on the medial aspect of the right lower extremity extending from the knee down to almost the ankle and large patches.  No erythema or redness or purulent drainage.) present.  Neurological:     Mental Status: He is  alert.      Assessment & Plan:   Problem List Items Addressed This Visit   None Visit Diagnoses     Dermatitis    -  Primary   Relevant Medications   fluconazole (DIFLUCAN) 150 MG tablet   predniSONE (DELTASONE) 20 MG tablet   triamcinolone ointment (KENALOG) 0.5 %   Other Relevant Orders   Ambulatory referral to Dermatology     Right lower extremity swelling is likely due to chronic inflammatory issue, it does look like an dermatitis that is been going on chronically in the right lower extremity, concern for autoimmune versus inflammatory versus fungal dermatitis, will treat with steroids and antifungal medicine.  Will also do referral to dermatology where he can go if it does not improve.  Follow up plan: Return if symptoms worsen or fail to improve.  Counseling provided for all of the vaccine components Orders Placed This Encounter  Procedures   Ambulatory referral to Dermatology    Arville Care, MD [facility] 04/02/2021, 12:06 PM

## 2021-04-16 ENCOUNTER — Other Ambulatory Visit: Payer: Self-pay | Admitting: Family Medicine

## 2021-04-19 ENCOUNTER — Telehealth: Payer: Self-pay | Admitting: Family Medicine

## 2021-04-19 ENCOUNTER — Encounter: Payer: Self-pay | Admitting: Family Medicine

## 2021-04-19 DIAGNOSIS — M479 Spondylosis, unspecified: Secondary | ICD-10-CM

## 2021-04-19 DIAGNOSIS — G8929 Other chronic pain: Secondary | ICD-10-CM

## 2021-04-19 MED ORDER — DICLOFENAC SODIUM 2 % EX SOLN: 1.0000 "application " | Freq: Four times a day (QID) | CUTANEOUS | 3 refills | Status: DC | PRN

## 2021-04-19 NOTE — Telephone Encounter (Signed)
Insurance does not want to pay for solution can we switch it to the gel?

## 2021-04-19 NOTE — Addendum Note (Signed)
Addended by: Tamera Punt on: 04/19/2021 02:14 PM   Modules accepted: Orders

## 2021-04-19 NOTE — Telephone Encounter (Signed)
Message sent to Dr. Louanne Skye to see if we can switch to Diclofenac gel. Insurance does not want to pay for solution

## 2021-04-20 MED ORDER — DICLOFENAC SODIUM 1 % EX GEL: 4.0000 g | Freq: Four times a day (QID) | CUTANEOUS | 3 refills | Status: DC

## 2021-04-20 NOTE — Telephone Encounter (Signed)
Sent diclofenac gel

## 2021-04-21 NOTE — Telephone Encounter (Signed)
Pts wife has been informed and understood. No further concerns.

## 2021-04-26 ENCOUNTER — Ambulatory Visit (INDEPENDENT_AMBULATORY_CARE_PROVIDER_SITE_OTHER): Payer: Medicare Other | Admitting: Family Medicine

## 2021-04-26 ENCOUNTER — Encounter: Payer: Self-pay | Admitting: Family Medicine

## 2021-04-26 VITALS — BP 128/74 | HR 94 | Temp 98.1°F | Ht 72.0 in | Wt 201.4 lb

## 2021-04-26 DIAGNOSIS — J9801 Acute bronchospasm: Secondary | ICD-10-CM

## 2021-04-26 DIAGNOSIS — L409 Psoriasis, unspecified: Secondary | ICD-10-CM

## 2021-04-26 MED ORDER — BETAMETHASONE VALERATE 0.1 % EX OINT: 1.0000 "application " | TOPICAL_OINTMENT | Freq: Two times a day (BID) | CUTANEOUS | 0 refills | Status: DC

## 2021-04-26 MED ORDER — ALBUTEROL SULFATE HFA 108 (90 BASE) MCG/ACT IN AERS
2.0000 | INHALATION_SPRAY | Freq: Four times a day (QID) | RESPIRATORY_TRACT | 0 refills | Status: DC | PRN
Start: 2021-04-26 — End: 2022-02-05

## 2021-04-26 NOTE — Patient Instructions (Signed)
Psoriasis Psoriasis is a long-term (chronic) skin condition. It occurs because your body's defense system (immune system) causes skin cells to form too quickly. This causes raised, red patches (plaques) on your skin that look silvery. The patches may be on all areas of your body. They can be any size or shape. Psoriasis can come and go. It can range from mild to very bad. It cannot be passed from one person to another (is not contagious). There is no cure for this condition, but it can be helped with treatment. What are the causes? The cause of psoriasis is not known. Some things can make it worse. These are: Skin damage, such as cuts, scrapes, sunburn, and dryness. Not getting enough sunlight. Some medicines. Alcohol. Tobacco. Stress. Infections. What increases the risk? Having a family member with psoriasis. Being very overweight (obese). Being 26-36 years old. Taking certain medicines. What are the signs or symptoms? There are different types of psoriasis. The types are: Plaque. This is the most common. Symptoms include red, raised patches with a silvery coating. These may be itchy. Your nails may be crumbly or fall off. Guttate. Symptoms include small red spots on your stomach area, arms, and legs. These may happen after you have been sick, such as with strep throat. Inverse. Symptoms include patches in your armpits, under your breasts, private areas, or on your butt. Pustular. Symptoms include pus-filled bumps on the palms of your hands or the soles of your feet. You also may feel very tired, weak, have a fever, and not be hungry. Erythrodermic. Symptoms include bright red skin that looks burned. You may have a fast heartbeat and a body temperature that is too high or too low. You may be itchy or in pain. Sebopsoriasis. Symptoms include red patches on your scalp, forehead, and face that are greasy. Psoriatic arthritis. Symptoms include swollen, painful joints along with scaly skin  patches. How is this treated? There is no cure for this condition, but treatment can: Help your skin heal. Lessen itching and irritation and swelling (inflammation). Slow the growth of new skin cells. Help your body's defense system respond better to your skin. Treatment may include: Creams or ointments. Light therapy. This may include natural sunlight or light therapy in a doctor's office. Medicines. These can help your body better manage skin cells. They may be used with light therapy or ointments. Medicines may include pills or injections. You may also get antibiotic medicines if you have an infection. Follow these instructions at home: Skin Care Apply lotion to your skin as needed. Only use those that your doctor has said are okay. Apply cool, wet cloths (cold compresses) to the affected areas. Do not use a hot tub or take hot showers. Use slightly warm, not hot, water when taking showers and baths. Do not scratch your skin. Lifestyle  Do not use any products that contain nicotine or tobacco, such as cigarettes, e-cigarettes, and chewing tobacco. If you need help quitting, ask your doctor. Lower your stress. Keep a healthy weight. Go out in the sun as told by your doctor. Do not get sunburned. Join a support group. Medicines Take or use over-the-counter and prescription medicines only as told by your doctor. If you were prescribed an antibiotic medicine, take it as told by your doctor. Do not stop using the antibiotic even if you start to feel better. Alcohol use If you drink alcohol: Limit how much you use: 0-1 drink a day for women. 0-2 drinks a day for men.  Be aware of how much alcohol is in your drink. In the [place] one drink equals one 12 oz bottle of beer (355 mL), one 5 oz glass of wine (148 mL), or one 1 oz glass of hard liquor (44 mL). General instructions Keep a journal to track the things that cause symptoms (triggers). Try to avoid these things. See a counselor if  you feel the support would help. Keep all follow-up visits as told by your doctor. This is important. Contact a doctor if: You have a fever. Your pain gets worse. You have more redness or warmth in the affected areas. You have new or worse pain or stiffness in your joints. Your nails start to break easily or pull away from the nail bed. You feel very sad (depressed). Summary Psoriasis is a long-term (chronic) skin condition. There is no cure for this condition, but treatment can help manage it. Keep a journal to track the things that cause symptoms. Take or use over-the-counter and prescription medicines only as told by your doctor. Keep all follow-up visits as told by your doctor. This is important. This information is not intended to replace advice given to you by your health care provider. Make sure you discuss any questions you have with your health care provider. Document Revised: 01/09/2018 Document Reviewed: 01/09/2018 Elsevier Patient Education  2022 [employer]  Bronchospasm, Adult Bronchospasm is when the small airways in the lungs narrow. This can make it very hard to breathe. Swelling and more mucus than normal can add to this problem. What are the causes? Having a cold. Exercise. The smell from sprays, perfumes, candles, and cleaners. Cold air. Stress or strong feelings such as laughing or crying. What increases the risk? Having asthma. Smoking. Being around someone who smokes (secondhand smoke). Having allergies. Being allergic to certain foods, medicine, or bug bites or stings. What are the signs or symptoms? Making a high-pitched whistling sound when you breathe, most often when you breathe out (wheezing). Coughing. A tight feeling in your chest. Feeling like you cannot catch your breath. Feeling like you have no energy to exercise. Breathing that is noisy. A cough that has a high pitch. How is this treated?  Using medicines that you breathe in (inhale).  These open up the airways and help you breathe. Medicines can be taken with a metered dose inhaler or a nebulizer device. Taking medicines to reduce swelling. Getting rid of what started the bronchospasm. Follow these instructions at home: Medicines Take over-the-counter and prescription medicines only as told by your doctor. If you need to use an inhaler or nebulizer to take your medicine, ask your doctor how to use it. You may be given a spacer to use with your inhaler. This makes it easier to get the medicine from the inhaler into your lungs. Lifestyle Do not smoke or use any products that contain nicotine or tobacco. If you need help quitting, ask your doctor. Keep track of things that start your bronchospasm. Avoid these if you can. When pollen, air pollution, or humidity is bad, keep windows closed. Use an air conditioner if you have one. Find ways to cope with stress and your feelings. Activity Some people have bronchospasm when they exercise. This is called exercise-induced bronchoconstriction (EIB). If you have this problem, talk with your doctor about how to deal with EIB. Some tips include: Use your inhaler before exercise. Exercise indoors if it is very cold or humid, or if the pollen and mold counts are high. Warm up  and cool down before and after exercise. Stop your exercise right away if your symptoms start or get worse. General instructions If you have asthma, make sure you have an asthma action plan. Stay up to date on your shots (immunizations). Keep all follow-up visits. Get help right away if: You have trouble breathing. You wheeze and cough and this does not get better after you take medicine. You have chest pain. You have trouble speaking more than one word in a sentence. These symptoms may be an emergency. Get help right away. Call 911. Do not wait to see if the symptoms will go away. Do not drive yourself to the hospital. Summary Bronchospasm is when the small  airways in the lungs narrow. Swelling and more mucus than normal can add to this problem. This can make it very hard to breathe. Do not smoke or use any products that contain nicotine or tobacco. If you need help quitting, ask your doctor. Get help right away if you wheeze and cough and this does not get better after you take medicine. This information is not intended to replace advice given to you by your health care provider. Make sure you discuss any questions you have with your health care provider. Document Revised: 09/28/2020 Document Reviewed: 09/28/2020 Elsevier Patient Education  2022 [employer].

## 2021-04-26 NOTE — Progress Notes (Signed)
Subjective: CC: Shortness of breath PCP: Dettinger, Elige Radon, MD JJO:ACZYSA Nicholas Yates is a 66 y.o. male, who is accompanied by his wife.  He is presenting to clinic today for:  1.  Shortness of breath/rash Patient reports intermittent shortness of breath, cough.  This has been ongoing for about a month.  He had an upper respiratory infection at the beginning of the month but that had resolved.  He is not a smoker and never has been.  No history of asthma.  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  Currently he works in [employer].  Has had a history of pneumonia which required an inhaler but otherwise really does not have any lung issues.  No hemoptysis or unplanned weight loss reported.  He was placed on oral corticosteroids a couple of weeks ago for a skin issue on the right leg.  He was also referred to dermatology as he has been struggling for this for well over a year.  He notes that he had no shortness of breath or coughing during the time of prednisone use and his skin issues seem to clear up.  It has since recurred however and he is quite frustrated about this.  He thinks he needs to be prescribed corticosteroids to keep it at bay.  He wonders if he has a blood infection but does not report any fevers, exudates/purulence from the rash.  He has used topical corticosteroid like hydrocortisone previously but does not really feel that this was efficacious.   ROS: Per HPI  No Known Allergies Past Medical History:  Diagnosis Date   Depression    GERD (gastroesophageal reflux disease)    had duodenal ulcer 5 months   Post traumatic stress disorder     Current Outpatient Medications:    diclofenac Sodium (VOLTAREN) 1 % GEL, Apply 4 g topically 4 (four) times daily., Disp: 350 g, Rfl: 3   fluconazole (DIFLUCAN) 150 MG tablet, Take 1 tablet (150 mg total) by mouth once a week., Disp: 2 tablet, Rfl: 0   meloxicam (MOBIC) 15 MG tablet, Take 1 tablet (15 mg total)  by mouth daily., Disp: 90 tablet, Rfl: 3   omeprazole (PRILOSEC) 40 MG capsule, Take 1 capsule (40 mg total) by mouth daily., Disp: 90 capsule, Rfl: 3   predniSONE (DELTASONE) 20 MG tablet, Take 3 tabs daily for 1 week, then 2 tabs daily for week 2, then 1 tab daily for week 3., Disp: 42 tablet, Rfl: 0   QUEtiapine (SEROQUEL) 100 MG tablet, Take 1 tablet (100 mg total) by mouth at bedtime., Disp: 30 tablet, Rfl: 1   rosuvastatin (CRESTOR) 10 MG tablet, Take 1 tablet (10 mg total) by mouth daily., Disp: 90 tablet, Rfl: 1   tadalafil (CIALIS) 10 MG tablet, Take 1 tablet (10 mg total) by mouth daily as needed for erectile dysfunction., Disp: 30 tablet, Rfl: 3   triamcinolone ointment (KENALOG) 0.5 %, Apply 1 application topically 2 (two) times daily., Disp: 30 g, Rfl: 0   venlafaxine XR (EFFEXOR-XR) 75 MG 24 hr capsule, Take 3 capsules (225 mg total) by mouth daily with breakfast., Disp: 90 capsule, Rfl: 3 Social History   Socioeconomic History   Marital status: Single    Spouse name: Not on file   Number of children: Not on file   Years of education: Not on file   Highest education level: Not on file  Occupational History   Not on  file  Tobacco Use   Smoking status: Never   Smokeless tobacco: Never  Vaping Use   Vaping Use: Never used  Substance and Sexual Activity   Alcohol use: Yes    Alcohol/week: 42.0 standard drinks    Types: 42 Cans of beer per week   Drug use: No   Sexual activity: Yes  Other Topics Concern   Not on file  Social History Narrative   Not on file   Social Determinants of Health   Financial Resource Strain: Not on file  Food Insecurity: Not on file  Transportation Needs: Not on file  Physical Activity: Not on file  Stress: Not on file  Social Connections: Not on file  Intimate Partner Violence: Not on file   No family history on file.  Objective: Office vital signs reviewed. BP 128/74    Pulse 94    Temp 98.1 F (36.7 C)    Ht 6' (1.829 m)    Wt 201  lb 6.4 oz (91.4 kg)    SpO2 96%    BMI 27.31 kg/m   Physical Examination:  General: Awake, alert, nontoxic male, No acute distress HEENT: No rhinorrhea Cardio: regular rate and rhythm, S1S2 heard, no murmurs appreciated Pulm: Global inspiratory and expiratory wheezes, worse on the right compared to left.  Normal work of breathing on room air MSK: Ambulating independently Skin: Silver scale plaques with appreciable associated hyperpigmentation/hyperemia.  No exudates.  Nonbleeding  Assessment/ Plan: 66 y.o. male   Bronchospasm - Plan: albuterol (VENTOLIN HFA) 108 (90 Base) MCG/ACT inhaler, DG Chest 2 View  Psoriasis - Plan: betamethasone valerate ointment (VALISONE) 0.1 %  Unusual that he would have bronchospasm in the absence of smoking and history of asthma.  Only known exposures that I can think of for this patient was recent upper respiratory illness and his history of being a Psychologist, occupational.  Possible chemical damage to the lungs.  I am going to trial him on an albuterol inhaler.  I gave him a sample spacer today.  We discussed potential side effects including jitteriness after use of inhaler.  I would like to reevaluate his lungs tomorrow when he comes in for chest x-ray.  If he has had an appropriate response to the albuterol, favor giving him an inhaled corticosteroid to use in the short-term.  We discussed the limitations of oral corticosteroids including impact on endocrine system, blood pressure and bones.  I would favor high potency corticosteroid applied to the affected areas on the right leg.  The right leg seems very consistent with a plaque psoriasis.  I agree with referral to dermatology and he is still waiting on this to be arranged.  No orders of the defined types were placed in this encounter.  No orders of the defined types were placed in this encounter.    [name], DO [facility] [phone]

## 2021-04-27 ENCOUNTER — Other Ambulatory Visit: Payer: Self-pay

## 2021-04-27 ENCOUNTER — Ambulatory Visit (INDEPENDENT_AMBULATORY_CARE_PROVIDER_SITE_OTHER): Payer: Medicare Other

## 2021-04-27 ENCOUNTER — Ambulatory Visit (HOSPITAL_COMMUNITY)
Admission: RE | Admit: 2021-04-27 | Discharge: 2021-04-27 | Disposition: A | Discharge status: Home / Self Care | Payer: Medicare Other | Source: Ambulatory Visit | Attending: Family Medicine | Admitting: Family Medicine

## 2021-04-27 DIAGNOSIS — R059 Cough, unspecified: Secondary | ICD-10-CM | POA: Diagnosis not present

## 2021-04-27 DIAGNOSIS — R918 Other nonspecific abnormal finding of lung field: Secondary | ICD-10-CM | POA: Diagnosis not present

## 2021-04-27 DIAGNOSIS — J9801 Acute bronchospasm: Secondary | ICD-10-CM | POA: Diagnosis not present

## 2021-04-29 ENCOUNTER — Ambulatory Visit (INDEPENDENT_AMBULATORY_CARE_PROVIDER_SITE_OTHER): Payer: Medicare Other | Admitting: Family Medicine

## 2021-04-29 ENCOUNTER — Encounter: Payer: Self-pay | Admitting: Family Medicine

## 2021-04-29 VITALS — BP 132/79 | HR 83 | Temp 98.0°F | Resp 20 | Ht 72.0 in | Wt 197.0 lb

## 2021-04-29 DIAGNOSIS — M479 Spondylosis, unspecified: Secondary | ICD-10-CM

## 2021-04-29 DIAGNOSIS — E785 Hyperlipidemia, unspecified: Secondary | ICD-10-CM | POA: Diagnosis not present

## 2021-04-29 DIAGNOSIS — F411 Generalized anxiety disorder: Secondary | ICD-10-CM | POA: Diagnosis not present

## 2021-04-29 DIAGNOSIS — F339 Major depressive disorder, recurrent, unspecified: Secondary | ICD-10-CM

## 2021-04-29 DIAGNOSIS — L409 Psoriasis, unspecified: Secondary | ICD-10-CM

## 2021-04-29 DIAGNOSIS — M545 Low back pain, unspecified: Secondary | ICD-10-CM

## 2021-04-29 DIAGNOSIS — G8929 Other chronic pain: Secondary | ICD-10-CM | POA: Diagnosis not present

## 2021-04-29 DIAGNOSIS — F4310 Post-traumatic stress disorder, unspecified: Secondary | ICD-10-CM | POA: Diagnosis not present

## 2021-04-29 MED ORDER — ROSUVASTATIN CALCIUM 10 MG PO TABS: 10.0000 mg | ORAL_TABLET | Freq: Every day | ORAL | 1 refills | Status: DC

## 2021-04-29 MED ORDER — DEXAMETHASONE 2 MG PO TABS: ORAL_TABLET | ORAL | 0 refills | Status: DC

## 2021-04-29 NOTE — Progress Notes (Signed)
BP 132/79    Pulse 83    Temp 98 F (36.7 C) (Oral)    Resp 20    Ht 6' (1.829 m)    Wt 197 lb (89.4 kg)    SpO2 97%    BMI 26.72 kg/m    Subjective:   Patient ID: Nicholas Yates, male    DOB: 1956-03-08, 66 y.o.   MRN: [id number]  HPI: Nicholas Yates is a 66 y.o. male presenting on 04/29/2021 for No chief complaint on file.   HPI Anxiety depression recheck Patient says his anxiety and depression is not under control more just because he is having the back pain and his back pain is limiting his ability to get up and moving.  He says as far as his mood is okay except for he is more irritable because he is in pain especially when he is at work and lifting 100 pound boxes frequently. Depression screen [facility] 2/9 04/29/2021 04/26/2021 04/02/2021 12/07/2020 10/19/2020  Decreased Interest 2 0 0 1 2  Down, Depressed, Hopeless 1 0 0 1 1  PHQ - 2 Score 3 0 0 2 3  Altered sleeping 1 - - 1 2  Tired, decreased energy 3 - - 1 2  Change in appetite 2 - - 1 0  Feeling bad or failure about yourself  0 - - 0 0  Trouble concentrating 1 - - 0 1  Moving slowly or fidgety/restless 1 - - 0 0  Suicidal thoughts 0 - - 0 0  PHQ-9 Score 11 - - 5 8  Difficult doing work/chores Very difficult - - - -  Some recent data might be hidden    Back pain Patient is coming in complaining of his back pain.  He says the meloxicam is not helping.  He says when he got the short course of steroids it did help.  He did go to 1 therapy session and they did electrode or TENS unit therapy and it helped some but has come right back.  He says the pain is in the midline of his lower back.  He denies any weakness or numbness going down either leg.  On right lower leg Patient still has a rash on his right lower leg that did improve with the prednisone but has come right back afterwards and is almost as bad as it was previously.  This rash covers most of the his right lower leg on the medial aspect and has a purplish discoloration.  He used to  have scaling but has been using betamethasone on it and does keep a little moist but it keeps getting darker.  We did do a referral to dermatology but he has not gone yet  Relevant past medical, surgical, family and social history reviewed and updated as indicated. Interim medical history since our last visit reviewed. Allergies and medications reviewed and updated.  Review of Systems  Constitutional:  Negative for chills and fever.  Eyes:  Negative for discharge.  Respiratory:  Negative for shortness of breath and wheezing.   Cardiovascular:  Negative for chest pain and leg swelling.  Musculoskeletal:  Positive for arthralgias and back pain. Negative for gait problem.  Skin:  Positive for color change and rash.  All other systems reviewed and are negative.  Per HPI unless specifically indicated above   Allergies as of 04/29/2021   No Known Allergies      Medication List        Accurate as of April 29, 2021  3:24 PM. If you have any questions, ask your nurse or doctor.          STOP taking these medications    predniSONE 20 MG tablet Commonly known as: DELTASONE Stopped by: Nicholas Yates Shyhiem Beeney, MD       TAKE these medications    albuterol 108 (90 Base) MCG/ACT inhaler Commonly known as: VENTOLIN HFA Inhale 2 puffs into the lungs every 6 (six) hours as needed for wheezing or shortness of breath.   betamethasone valerate ointment 0.1 % Commonly known as: VALISONE Apply 1 application topically 2 (two) times daily. To right leg lesions as needed for plaque psoriasis   dexamethasone 2 MG tablet Commonly known as: DECADRON Take 4 tablets for 3 days then 3 tablets for 3 days then 2 tablets for 3 days then 1 tablet for 3 days Started by: Nicholas Yates Eyla Tallon, MD   diclofenac Sodium 1 % Gel Commonly known as: VOLTAREN Apply 4 g topically 4 (four) times daily.   fluconazole 150 MG tablet Commonly known as: Diflucan Take 1 tablet (150 mg total) by mouth once a week.    meloxicam 15 MG tablet Commonly known as: MOBIC Take 1 tablet (15 mg total) by mouth daily.   omeprazole 40 MG capsule Commonly known as: PRILOSEC Take 1 capsule (40 mg total) by mouth daily.   QUEtiapine 100 MG tablet Commonly known as: SEROquel Take 1 tablet (100 mg total) by mouth at bedtime.   rosuvastatin 10 MG tablet Commonly known as: Crestor Take 1 tablet (10 mg total) by mouth daily.   tadalafil 10 MG tablet Commonly known as: CIALIS Take 1 tablet (10 mg total) by mouth daily as needed for erectile dysfunction.   venlafaxine XR 75 MG 24 hr capsule Commonly known as: EFFEXOR-XR Take 3 capsules (225 mg total) by mouth daily with breakfast.         Objective:   BP 132/79    Pulse 83    Temp 98 F (36.7 C) (Oral)    Resp 20    Ht 6' (1.829 m)    Wt 197 lb (89.4 kg)    SpO2 97%    BMI 26.72 kg/m   Wt Readings from Last 3 Encounters:  04/29/21 197 lb (89.4 kg)  04/26/21 201 lb 6.4 oz (91.4 kg)  04/02/21 204 lb (92.5 kg)    Physical Exam Vitals and nursing note reviewed.  Constitutional:      General: He is not in acute distress.    Appearance: He is well-developed. He is not diaphoretic.  Eyes:     General: No scleral icterus.       Right eye: No discharge.     Conjunctiva/sclera: Conjunctivae normal.     Pupils: Pupils are equal, round, and reactive to light.  Neck:     Thyroid: No thyromegaly.  Cardiovascular:     Rate and Rhythm: Normal rate and regular rhythm.     Heart sounds: Normal heart sounds. No murmur heard. Pulmonary:     Effort: Pulmonary effort is normal. No respiratory distress.     Breath sounds: Normal breath sounds. No wheezing.  Musculoskeletal:        General: Normal range of motion.     Cervical back: Neck supple.     Lumbar back: Tenderness (Midline lower back tenderness) present. Normal range of motion. Negative right straight leg raise test and negative left straight leg raise test.  Lymphadenopathy:     Cervical: No cervical  adenopathy.  Skin:    General: Skin is warm and dry.     Findings: Rash (Purple discoloration on the anterior right lower leg covering most of the anterior aspect of the right lower leg in a patchy distribution.  No scale noted at this time) present.  Neurological:     Mental Status: He is alert and oriented to person, place, and time.     Coordination: Coordination normal.  Psychiatric:        Behavior: Behavior normal.      Assessment & Plan:   Problem List Items Addressed This Visit       Other   Depression, recurrent ([place]) - Primary   Relevant Orders   CBC with Differential/Platelet   PTSD (post-traumatic stress disorder)   Generalized anxiety disorder   Relevant Orders   CBC with Differential/Platelet   Dyslipidemia   Relevant Medications   rosuvastatin (CRESTOR) 10 MG tablet   Other Relevant Orders   CBC with Differential/Platelet   CMP14+EGFR   Lipid panel   Other Visit Diagnoses     Psoriasis       Relevant Medications   dexamethasone (DECADRON) 2 MG tablet   Chronic bilateral low back pain without sciatica       Relevant Medications   dexamethasone (DECADRON) 2 MG tablet   Other Relevant Orders   MR Lumbar Spine Wo Contrast   Osteoarthritis of lower back       Relevant Medications   dexamethasone (DECADRON) 2 MG tablet   Other Relevant Orders   MR Lumbar Spine Wo Contrast       Recommended for patient to go to dermatology, will do another short course of steroids, also ordered MRI of his lumbar spine.    Patient has done 1 therapy session, has had anti-inflammatories and also did a round of steroids.  He has done a TENS unit and his back is not improving so we will order the MRI Follow up plan: Return in about 3 months (around 07/27/2021), or if symptoms worsen or fail to improve, for Depression anxiety and follow-up for his back.  Counseling provided for all of the vaccine components Orders Placed This Encounter  Procedures   MR Lumbar Spine Wo  Contrast   CBC with Differential/Platelet   CMP14+EGFR   Lipid panel    Caryl Pina, MD [facility] 04/29/2021, 3:24 PM

## 2021-04-30 LAB — CBC WITH DIFFERENTIAL/PLATELET
Basophils Absolute: 0.1 10*3/uL (ref 0.0–0.2)
Basos: 1 %
EOS (ABSOLUTE): 0.2 10*3/uL (ref 0.0–0.4)
Eos: 2 %
Hematocrit: 34 % — ABNORMAL LOW (ref 37.5–51.0)
Hemoglobin: 11.6 g/dL — ABNORMAL LOW (ref 13.0–17.7)
Immature Grans (Abs): 0 10*3/uL (ref 0.0–0.1)
Immature Granulocytes: 0 %
Lymphocytes Absolute: 1.6 10*3/uL (ref 0.7–3.1)
Lymphs: 17 %
MCH: 31.4 pg (ref 26.6–33.0)
MCHC: 34.1 g/dL (ref 31.5–35.7)
MCV: 92 fL (ref 79–97)
Monocytes Absolute: 0.9 10*3/uL (ref 0.1–0.9)
Monocytes: 10 %
Neutrophils Absolute: 6.7 10*3/uL (ref 1.4–7.0)
Neutrophils: 70 %
Platelets: 459 10*3/uL — ABNORMAL HIGH (ref 150–450)
RBC: 3.69 x10E6/uL — ABNORMAL LOW (ref 4.14–5.80)
RDW: 13 % (ref 11.6–15.4)
WBC: 9.5 10*3/uL (ref 3.4–10.8)

## 2021-04-30 LAB — CMP14+EGFR
ALT: 18 IU/L (ref 0–44)
AST: 28 IU/L (ref 0–40)
Albumin/Globulin Ratio: 2 (ref 1.2–2.2)
Albumin: 4.5 g/dL (ref 3.8–4.8)
Alkaline Phosphatase: 148 IU/L — ABNORMAL HIGH (ref 44–121)
BUN/Creatinine Ratio: 16 (ref 10–24)
BUN: 19 mg/dL (ref 8–27)
Bilirubin Total: 0.2 mg/dL (ref 0.0–1.2)
CO2: 21 mmol/L (ref 20–29)
Calcium: 9 mg/dL (ref 8.6–10.2)
Chloride: 104 mmol/L (ref 96–106)
Creatinine, Ser: 1.21 mg/dL (ref 0.76–1.27)
Globulin, Total: 2.2 g/dL (ref 1.5–4.5)
Glucose: 66 mg/dL — ABNORMAL LOW (ref 70–99)
Potassium: 5.1 mmol/L (ref 3.5–5.2)
Sodium: 141 mmol/L (ref 134–144)
Total Protein: 6.7 g/dL (ref 6.0–8.5)
eGFR: 66 mL/min/{1.73_m2} (ref >59)

## 2021-04-30 LAB — LIPID PANEL
Chol/HDL Ratio: 2.1 ratio (ref 0.0–5.0)
Cholesterol, Total: 163 mg/dL (ref 100–199)
HDL: 79 mg/dL (ref >39)
LDL Chol Calc (NIH): 75 mg/dL (ref 0–99)
Triglycerides: 39 mg/dL (ref 0–149)
VLDL Cholesterol Cal: 9 mg/dL (ref 5–40)

## 2021-05-03 ENCOUNTER — Ambulatory Visit: Payer: Medicare Other | Admitting: Family Medicine

## 2021-05-13 ENCOUNTER — Ambulatory Visit (HOSPITAL_COMMUNITY)
Admission: RE | Admit: 2021-05-13 | Discharge: 2021-05-13 | Disposition: A | Discharge status: Home / Self Care | Payer: Medicare Other | Source: Ambulatory Visit | Attending: Family Medicine | Admitting: Family Medicine

## 2021-05-13 ENCOUNTER — Other Ambulatory Visit: Payer: Self-pay

## 2021-05-13 DIAGNOSIS — G8929 Other chronic pain: Secondary | ICD-10-CM | POA: Diagnosis not present

## 2021-05-13 DIAGNOSIS — M479 Spondylosis, unspecified: Secondary | ICD-10-CM | POA: Insufficient documentation

## 2021-05-13 DIAGNOSIS — M545 Low back pain, unspecified: Secondary | ICD-10-CM | POA: Diagnosis not present

## 2021-05-13 DIAGNOSIS — M47816 Spondylosis without myelopathy or radiculopathy, lumbar region: Secondary | ICD-10-CM | POA: Diagnosis not present

## 2021-05-13 DIAGNOSIS — M48061 Spinal stenosis, lumbar region without neurogenic claudication: Secondary | ICD-10-CM | POA: Diagnosis not present

## 2021-05-13 DIAGNOSIS — M5126 Other intervertebral disc displacement, lumbar region: Secondary | ICD-10-CM | POA: Diagnosis not present

## 2021-05-17 ENCOUNTER — Encounter: Payer: Self-pay | Admitting: Family Medicine

## 2021-05-27 ENCOUNTER — Other Ambulatory Visit: Payer: Self-pay | Admitting: Family Medicine

## 2021-05-27 DIAGNOSIS — F411 Generalized anxiety disorder: Secondary | ICD-10-CM

## 2021-05-27 DIAGNOSIS — S32050S Wedge compression fracture of fifth lumbar vertebra, sequela: Secondary | ICD-10-CM

## 2021-05-27 DIAGNOSIS — M4716 Other spondylosis with myelopathy, lumbar region: Secondary | ICD-10-CM

## 2021-05-27 DIAGNOSIS — F339 Major depressive disorder, recurrent, unspecified: Secondary | ICD-10-CM

## 2021-05-27 DIAGNOSIS — F3181 Bipolar II disorder: Secondary | ICD-10-CM

## 2021-05-27 NOTE — Progress Notes (Signed)
Placed referral for the patient. 

## 2021-05-28 ENCOUNTER — Other Ambulatory Visit: Payer: Self-pay | Admitting: Family Medicine

## 2021-05-28 DIAGNOSIS — N529 Male erectile dysfunction, unspecified: Secondary | ICD-10-CM

## 2021-05-31 ENCOUNTER — Encounter: Payer: Self-pay | Admitting: Family Medicine

## 2021-05-31 ENCOUNTER — Other Ambulatory Visit: Payer: Self-pay

## 2021-05-31 DIAGNOSIS — M25552 Pain in left hip: Secondary | ICD-10-CM

## 2021-06-09 ENCOUNTER — Other Ambulatory Visit (INDEPENDENT_AMBULATORY_CARE_PROVIDER_SITE_OTHER): Payer: Medicare Other

## 2021-06-09 DIAGNOSIS — M25552 Pain in left hip: Secondary | ICD-10-CM | POA: Diagnosis not present

## 2021-06-09 DIAGNOSIS — Z96642 Presence of left artificial hip joint: Secondary | ICD-10-CM | POA: Diagnosis not present

## 2021-06-14 DIAGNOSIS — S32050A Wedge compression fracture of fifth lumbar vertebra, initial encounter for closed fracture: Secondary | ICD-10-CM | POA: Diagnosis not present

## 2021-06-14 DIAGNOSIS — R030 Elevated blood-pressure reading, without diagnosis of hypertension: Secondary | ICD-10-CM | POA: Diagnosis not present

## 2021-07-02 DIAGNOSIS — Y929 Unspecified place or not applicable: Secondary | ICD-10-CM | POA: Diagnosis not present

## 2021-07-02 DIAGNOSIS — M8088XA Other osteoporosis with current pathological fracture, vertebra(e), initial encounter for fracture: Secondary | ICD-10-CM | POA: Diagnosis not present

## 2021-07-02 DIAGNOSIS — M532X6 Spinal instabilities, lumbar region: Secondary | ICD-10-CM | POA: Diagnosis not present

## 2021-07-02 DIAGNOSIS — S32050A Wedge compression fracture of fifth lumbar vertebra, initial encounter for closed fracture: Secondary | ICD-10-CM | POA: Diagnosis not present

## 2021-07-02 DIAGNOSIS — X58XXXA Exposure to other specified factors, initial encounter: Secondary | ICD-10-CM | POA: Diagnosis not present

## 2021-07-02 DIAGNOSIS — G8929 Other chronic pain: Secondary | ICD-10-CM | POA: Diagnosis not present

## 2021-07-02 DIAGNOSIS — M545 Low back pain, unspecified: Secondary | ICD-10-CM | POA: Diagnosis not present

## 2021-07-28 ENCOUNTER — Ambulatory Visit: Payer: Medicare Other | Admitting: Family Medicine

## 2021-07-29 ENCOUNTER — Ambulatory Visit (HOSPITAL_COMMUNITY): Payer: Self-pay | Admitting: Clinical

## 2021-08-11 ENCOUNTER — Ambulatory Visit (HOSPITAL_COMMUNITY): Payer: Self-pay | Admitting: Clinical

## 2021-09-01 ENCOUNTER — Encounter: Payer: Self-pay | Admitting: Family Medicine

## 2021-09-02 ENCOUNTER — Other Ambulatory Visit: Payer: Self-pay

## 2021-09-02 MED ORDER — QUETIAPINE FUMARATE 100 MG PO TABS: 100.0000 mg | ORAL_TABLET | Freq: Every day | ORAL | 0 refills | Status: DC

## 2021-09-06 ENCOUNTER — Encounter: Payer: Self-pay | Admitting: Family Medicine

## 2021-09-07 ENCOUNTER — Other Ambulatory Visit: Payer: Self-pay | Admitting: Family Medicine

## 2021-09-07 DIAGNOSIS — N529 Male erectile dysfunction, unspecified: Secondary | ICD-10-CM

## 2021-09-17 ENCOUNTER — Ambulatory Visit: Payer: Medicare Other | Admitting: Family Medicine

## 2021-11-01 ENCOUNTER — Encounter: Payer: Self-pay | Admitting: Family Medicine

## 2021-11-01 ENCOUNTER — Ambulatory Visit (INDEPENDENT_AMBULATORY_CARE_PROVIDER_SITE_OTHER): Payer: Medicare Other | Admitting: Family Medicine

## 2021-11-01 VITALS — BP 140/75 | HR 74 | Temp 98.0°F | Ht 72.0 in | Wt 188.0 lb

## 2021-11-01 DIAGNOSIS — G8929 Other chronic pain: Secondary | ICD-10-CM

## 2021-11-01 DIAGNOSIS — F339 Major depressive disorder, recurrent, unspecified: Secondary | ICD-10-CM

## 2021-11-01 DIAGNOSIS — E785 Hyperlipidemia, unspecified: Secondary | ICD-10-CM | POA: Diagnosis not present

## 2021-11-01 DIAGNOSIS — M479 Spondylosis, unspecified: Secondary | ICD-10-CM | POA: Diagnosis not present

## 2021-11-01 DIAGNOSIS — F411 Generalized anxiety disorder: Secondary | ICD-10-CM

## 2021-11-01 DIAGNOSIS — F419 Anxiety disorder, unspecified: Secondary | ICD-10-CM

## 2021-11-01 DIAGNOSIS — F4310 Post-traumatic stress disorder, unspecified: Secondary | ICD-10-CM

## 2021-11-01 DIAGNOSIS — F32A Depression, unspecified: Secondary | ICD-10-CM

## 2021-11-01 DIAGNOSIS — M545 Low back pain, unspecified: Secondary | ICD-10-CM | POA: Diagnosis not present

## 2021-11-01 DIAGNOSIS — F3181 Bipolar II disorder: Secondary | ICD-10-CM

## 2021-11-01 MED ORDER — VENLAFAXINE HCL ER 75 MG PO CP24: 225.0000 mg | ORAL_CAPSULE | Freq: Every day | ORAL | 3 refills | Status: DC

## 2021-11-01 MED ORDER — OMEPRAZOLE 40 MG PO CPDR: 40.0000 mg | DELAYED_RELEASE_CAPSULE | Freq: Every day | ORAL | 3 refills | Status: DC

## 2021-11-01 MED ORDER — ROSUVASTATIN CALCIUM 10 MG PO TABS: 10.0000 mg | ORAL_TABLET | Freq: Every day | ORAL | 1 refills | Status: DC

## 2021-11-01 MED ORDER — QUETIAPINE FUMARATE 100 MG PO TABS: 100.0000 mg | ORAL_TABLET | Freq: Every day | ORAL | 1 refills | Status: DC

## 2021-11-01 MED ORDER — SILDENAFIL CITRATE 20 MG PO TABS
20.0000 mg | ORAL_TABLET | ORAL | 1 refills | Status: DC | PRN
Start: 2021-11-01 — End: 2021-12-01

## 2021-11-01 MED ORDER — DICLOFENAC SODIUM 75 MG PO TBEC: 75.0000 mg | DELAYED_RELEASE_TABLET | Freq: Two times a day (BID) | ORAL | 1 refills | Status: DC

## 2021-11-01 NOTE — Progress Notes (Signed)
BP (!) 150/75   Pulse 74   Temp 98 F (36.7 C)   Ht 6' (1.829 m)   Wt 188 lb (85.3 kg)   SpO2 98%   BMI 25.50 kg/m    Subjective:   Patient ID: Nicholas Yates, male    DOB: Dec 18, 1955, 66 y.o.   MRN: [id number]  HPI: Nicholas Yates is a 66 y.o. male presenting on 11/01/2021 for Medical Management of Chronic Issues, Hypertension, and Depression   HPI Hypertension Patient is currently on no medicine currently, and their blood pressure today is 150/75. Patient denies any lightheadedness or dizziness. Patient denies headaches, blurred vision, chest pains, shortness of breath, or weakness. Denies any side effects from medication and is content with current medication.   Hyperlipidemia Patient is coming in for recheck of his hyperlipidemia. The patient is currently taking Crestor. They deny any issues with myalgias or history of liver damage from it. They deny any focal numbness or weakness or chest pain.   Anxiety depression PTSD recheck Patient is currently taking Effexor and Seroquel for anxiety and depression and PTSD.  He says he has not been taking the Seroquel as much because admitted to groggy in the morning.  Recommended to try cut in half.  Patient denies any suicidal ideations and says that his anxiety is doing well.    11/01/2021    3:14 PM 04/29/2021    2:55 PM 04/26/2021    4:53 PM 04/02/2021   11:20 AM 12/07/2020   10:08 AM  Depression screen PHQ 2/9  Decreased Interest 2 2 0 0 1  Down, Depressed, Hopeless 1 1 0 0 1  PHQ - 2 Score 3 3 0 0 2  Altered sleeping _0 Tired, decreased energy _1 Change in appetite _2 Feeling bad or failure about yourself  1 0   0  Trouble concentrating 1 1   0  Moving slowly or fidgety/restless 0 1   0  Suicidal thoughts 0 0   0  PHQ-9 Score _3 Difficult doing work/chores Very difficult Very difficult       Patient has arthritis in his back and takes meloxicam and Voltaren gel for this and just feels like they are  not helping as much and would like to trade to something else.  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  He says is not any worse than it has been which is getting stiff for  Relevant past medical, surgical, family and social history reviewed and updated as indicated. Interim medical history since our last visit reviewed. Allergies and medications reviewed and updated.  Review of Systems  Constitutional:  Negative for chills and fever.  Eyes:  Negative for discharge.  Respiratory:  Negative for shortness of breath and wheezing.   Cardiovascular:  Negative for chest pain and leg swelling.  Musculoskeletal:  Positive for arthralgias, back pain and myalgias. Negative for gait problem.  Skin:  Negative for rash.  Psychiatric/Behavioral:  Positive for dysphoric mood. Negative for self-injury, sleep disturbance and suicidal ideas. The patient is nervous/anxious.   All other systems reviewed and are negative.   Per HPI unless specifically indicated above   Allergies as of 11/01/2021   No  Known Allergies      Medication List        Accurate as of November 01, 2021  3:32 PM. If you have any questions, ask your nurse or doctor.          STOP taking these medications    meloxicam 15 MG tablet Commonly known as: MOBIC Stopped by: Fransisca Kaufmann [place] Zou, MD   tadalafil 10 MG tablet Commonly known as: CIALIS Stopped by: Worthy Rancher, MD       TAKE these medications    albuterol 108 (90 Base) MCG/ACT inhaler Commonly known as: VENTOLIN HFA Inhale 2 puffs into the lungs every 6 (six) hours as needed for wheezing or shortness of breath.   betamethasone valerate ointment 0.1 % Commonly known as: VALISONE Apply 1 application topically 2 (two) times daily. To right leg lesions as needed for plaque psoriasis    dexamethasone 2 MG tablet Commonly known as: DECADRON Take 4 tablets for 3 days then 3 tablets for 3 days then 2 tablets for 3 days then 1 tablet for 3 days   diclofenac 75 MG EC tablet Commonly known as: VOLTAREN Take 1 tablet (75 mg total) by mouth 2 (two) times daily. Started by: Fransisca Kaufmann Toribio Seiber, MD   diclofenac Sodium 1 % Gel Commonly known as: VOLTAREN Apply 4 g topically 4 (four) times daily.   fluconazole 150 MG tablet Commonly known as: Diflucan Take 1 tablet (150 mg total) by mouth once a week.   omeprazole 40 MG capsule Commonly known as: PRILOSEC Take 1 capsule (40 mg total) by mouth daily.   QUEtiapine 100 MG tablet Commonly known as: SEROquel Take 1 tablet (100 mg total) by mouth at bedtime.   rosuvastatin 10 MG tablet Commonly known as: Crestor Take 1 tablet (10 mg total) by mouth daily.   sildenafil 20 MG tablet Commonly known as: REVATIO Take 1-5 tablets (20-100 mg total) by mouth as needed. Started by: Worthy Rancher, MD   venlafaxine XR 75 MG 24 hr capsule Commonly known as: EFFEXOR-XR Take 3 capsules (225 mg total) by mouth daily with breakfast.         Objective:   BP (!) 150/75   Pulse 74   Temp 98 F (36.7 C)   Ht 6' (1.829 m)   Wt 188 lb (85.3 kg)   SpO2 98%   BMI 25.50 kg/m   Wt Readings from Last 3 Encounters:  11/01/21 188 lb (85.3 kg)  04/29/21 197 lb (89.4 kg)  04/26/21 201 lb 6.4 oz (91.4 kg)    Physical Exam Vitals and nursing note reviewed.  Constitutional:      General: He is not in acute distress.    Appearance: He is well-developed. He is not diaphoretic.  Eyes:     General: No scleral icterus.    Conjunctiva/sclera: Conjunctivae normal.  Neck:     Thyroid: No thyromegaly.  Cardiovascular:     Rate and Rhythm: Normal rate and regular rhythm.     Heart sounds: Normal heart sounds. No murmur heard. Pulmonary:     Effort: Pulmonary effort is normal. No respiratory distress.     Breath sounds: Normal  breath sounds. No wheezing.  Musculoskeletal:        General: No swelling. Normal range of motion.     Cervical back: Neck supple.  Lymphadenopathy:     Cervical: No cervical adenopathy.  Skin:    General: Skin is warm and dry.     Findings: No rash.  Neurological:     Mental Status: He is alert and oriented to person, place, and time.     Coordination: Coordination normal.  Psychiatric:        Behavior: Behavior normal.       Assessment & Plan:   Problem List Items Addressed This Visit       Other   Depression, recurrent (H[facility])   Relevant Medications   venlafaxine XR (EFFEXOR-XR) 75 MG 24 hr capsule   PTSD (post-traumatic stress disorder)   Relevant Medications   venlafaxine XR (EFFEXOR-XR) 75 MG 24 hr capsule   Generalized anxiety disorder   Relevant Medications   omeprazole (PRILOSEC) 40 MG capsule   venlafaxine XR (EFFEXOR-XR) 75 MG 24 hr capsule   Bipolar 2 disorder (HCC) - Primary   Relevant Orders   CMP14+EGFR   CBC with Differential/Platelet   Lipid panel   Dyslipidemia   Relevant Medications   rosuvastatin (CRESTOR) 10 MG tablet   Other Relevant Orders   CMP14+EGFR   CBC with Differential/Platelet   Lipid panel   Other Visit Diagnoses     Chronic bilateral low back pain without sciatica       Relevant Medications   venlafaxine XR (EFFEXOR-XR) 75 MG 24 hr capsule   diclofenac (VOLTAREN) 75 MG EC tablet   Osteoarthritis of lower back       Relevant Medications   diclofenac (VOLTAREN) 75 MG EC tablet   Anxiety and depression       Relevant Medications   venlafaxine XR (EFFEXOR-XR) 75 MG 24 hr capsule       We will give care and all and switch to Viagra instead of Cialis. Follow up plan: Return in about 3 months (around 02/01/2022), or if symptoms worsen or fail to improve, for Depression and anxiety and cholesterol..  Counseling provided for all of the vaccine components Orders Placed This Encounter  Procedures   CMP14+EGFR   CBC with  Differential/Platelet   Lipid panel    Caryl Pina, MD [facility] 11/01/2021, 3:32 PM

## 2021-11-30 ENCOUNTER — Other Ambulatory Visit: Payer: Self-pay | Admitting: Family Medicine

## 2021-11-30 DIAGNOSIS — N529 Male erectile dysfunction, unspecified: Secondary | ICD-10-CM

## 2021-12-01 ENCOUNTER — Telehealth: Payer: Self-pay | Admitting: Family Medicine

## 2021-12-01 DIAGNOSIS — N529 Male erectile dysfunction, unspecified: Secondary | ICD-10-CM

## 2021-12-01 MED ORDER — TADALAFIL 10 MG PO TABS: 10.0000 mg | ORAL_TABLET | Freq: Every day | ORAL | 1 refills | Status: DC | PRN

## 2021-12-01 NOTE — Telephone Encounter (Signed)
  Prescription Request  12/01/2021   What is the name of the medication or equipment? SILDENAFIL  Have you contacted your pharmacy to request a refill? YES  Which pharmacy would you like this sent to? [facility]  Pt is completely out of his medicine and needs refill sent to the pharmacy ASAP.

## 2021-12-01 NOTE — Telephone Encounter (Signed)
Pt wants CIALIS because it is cheaper.

## 2021-12-01 NOTE — Telephone Encounter (Signed)
Sent Cialis because patient says cheaper than the Viagra

## 2021-12-02 ENCOUNTER — Encounter: Payer: Self-pay | Admitting: Family Medicine

## 2022-01-11 ENCOUNTER — Other Ambulatory Visit: Payer: Self-pay | Admitting: Family Medicine

## 2022-01-11 DIAGNOSIS — N529 Male erectile dysfunction, unspecified: Secondary | ICD-10-CM

## 2022-02-05 ENCOUNTER — Ambulatory Visit
Admission: EM | Admit: 2022-02-05 | Discharge: 2022-02-05 | Disposition: A | Discharge status: Home / Self Care | Payer: Medicare Other | Attending: Emergency Medicine | Admitting: Emergency Medicine

## 2022-02-05 DIAGNOSIS — K2100 Gastro-esophageal reflux disease with esophagitis, without bleeding: Secondary | ICD-10-CM

## 2022-02-05 DIAGNOSIS — Z8711 Personal history of peptic ulcer disease: Secondary | ICD-10-CM

## 2022-02-05 MED ORDER — SUCRALFATE 1 GM/10ML PO SUSP
1.0000 g | Freq: Four times a day (QID) | ORAL | 0 refills | Status: DC | PRN
Start: 2022-02-05 — End: 2022-02-14

## 2022-02-05 MED ORDER — LANSOPRAZOLE 30 MG PO CPDR: 30.0000 mg | DELAYED_RELEASE_CAPSULE | Freq: Two times a day (BID) | ORAL | 2 refills | Status: DC

## 2022-02-05 NOTE — Discharge Instructions (Addendum)
Please discontinue omeprazole at this time.  I sent a prescription for a medication called Prevacid to your pharmacy.  I would like for you to take it twice daily with meals, preferably your first meal of the day your last meal of the day.  For short-term relief of your symptoms, I provided you with a prescription for Carafate.  Please be mindful not to take this medication less than 1 hour before taking Prevacid or less than 2 hours after taking Prevacid.  This medication can interact with Prevacid and prevent your body from absorbing it.  Please consider elevating the head of your bed by about 2 inches by using a brick or small block.  Please be mindful to not eat or drink anything 2 hours before you lie down to go to sleep at night.  Please do your best to decrease your alcohol intake for the next week or so to allow lining of your stomach to completely heal.  Please follow-up with your primary care physician to discuss referral to gastroenterology for repeat EGD and discussion of possible progression of your hiatal hernia that might now require repair.  Thank you for visiting [place] here in urgent care today.

## 2022-02-05 NOTE — ED Triage Notes (Signed)
Pt reports the lat 3 days he has been up all night with GERD, mid chest pain radiates to the opposite side of chest, chills, fatigue, emesis.  Pepto Bismol over the counter was taken with some relief.

## 2022-02-05 NOTE — ED Provider Notes (Signed)
[facility]    CSN: [id number] Arrival date & time: 02/05/22  1551    HISTORY  No chief complaint on file.  HPI Nicholas Yates is a pleasant, 66 y.o. male who presents to urgent care today. Patient reports a 3-day history of heartburn that is been keeping him up all night.  Patient states the pain is in the center of his chest and radiates to the opposite side of his chest.  Patient states he is also had chills, fatigue and nonbloody emesis.  Patient states he took Pepto-Bismol with some relief.  Patient reports a history of GERD, not currently taking any heartburn medicine after running out a few weeks ago, states he was taking omeprazole 40 mg.  Patient states that the reason he ran out was because the omeprazole was not working as well and he had begun taking 2 and 3 capsules daily instead of 1 as prescribed.  Patient reports significant alcohol intake on a daily basis.  Patient denies history of smoking.  Patient states last EGD was about 10 years ago, states at that time he had a bleeding peptic ulcer that had nearly perforated his stomach, was also found to have a hiatal hernia.  Patient does not recall what medication he was provided at the time but states he has been managed well on omeprazole until several weeks ago.  Patient states he has been able to eat normally, denies constipation.  Patient states eating does not make his discomfort worse or better.  Patient states his chest pain does not really start until he lies down to go to sleep at night.  The history is provided by the patient.   Past Medical History:  Diagnosis Date   Depression    GERD (gastroesophageal reflux disease)    had duodenal ulcer 5 months   Post traumatic stress disorder    Patient Active Problem List   Diagnosis Date Noted   Dyslipidemia 04/29/2021   Extrapyramidal symptom 10/04/2018   Bipolar 2 disorder (HCC) 08/31/2018   ED (erectile dysfunction) 08/31/2018   Generalized anxiety disorder  01/20/2017   Depression, recurrent (HCC) 07/07/2016   PTSD (post-traumatic stress disorder) 07/07/2016   Duodenal ulcer 07/07/2016   Past Surgical History:  Procedure Laterality Date   HERNIA REPAIR     TOTAL HIP ARTHROPLASTY     bilateral    Home Medications    Prior to Admission medications   Medication Sig Start Date End Date Taking? Authorizing Provider  lansoprazole (PREVACID) 30 MG capsule Take 1 capsule (30 mg total) by mouth 2 (two) times daily before a meal. 30 minutes prior to breakfast meal 02/05/22 05/06/22 Yes Theadora Rama Scales, PA-C  sucralfate (CARAFATE) 1 GM/10ML suspension Take 10 mLs (1 g total) by mouth 4 (four) times daily as needed. 02/05/22  Yes Theadora Rama Scales, PA-C  dexamethasone (DECADRON) 2 MG tablet Take 4 tablets for 3 days then 3 tablets for 3 days then 2 tablets for 3 days then 1 tablet for 3 days 04/29/21   Dettinger, Elige Radon, MD  diclofenac (VOLTAREN) 75 MG EC tablet Take 1 tablet (75 mg total) by mouth 2 (two) times daily. 11/01/21   Dettinger, Elige Radon, MD  diclofenac Sodium (VOLTAREN) 1 % GEL Apply 4 g topically 4 (four) times daily. 04/20/21   Dettinger, Elige Radon, MD  QUEtiapine (SEROQUEL) 100 MG tablet Take 1 tablet (100 mg total) by mouth at bedtime. 11/01/21   Dettinger, Elige Radon, MD  rosuvastatin (CRESTOR) 10 MG tablet  Take 1 tablet (10 mg total) by mouth daily. 11/01/21   Dettinger, Elige Radon, MD  tadalafil (CIALIS) 10 MG tablet Take 1 tablet (10 mg total) by mouth daily as needed for erectile dysfunction. 12/01/21   Dettinger, Elige Radon, MD  venlafaxine XR (EFFEXOR-XR) 75 MG 24 hr capsule Take 3 capsules (225 mg total) by mouth daily with breakfast. 11/01/21   Dettinger, Elige Radon, MD    Family History No family history on file. Social History Social History   Tobacco Use   Smoking status: Never   Smokeless tobacco: Never  Vaping Use   Vaping Use: Never used  Substance Use Topics   Alcohol use: Yes    Alcohol/week: 42.0 standard drinks  of alcohol    Types: 42 Cans of beer per week   Drug use: No   Allergies   Patient has no known allergies.  Review of Systems Review of Systems Pertinent findings revealed after performing a 14 point review of systems has been noted in the history of present illness.  Physical Exam Triage Vital Signs ED Triage Vitals  Enc Vitals Group     BP 01/15/21 0827 (!) 147/82     Pulse Rate 01/15/21 0827 72     Resp 01/15/21 0827 18     Temp 01/15/21 0827 98.3 F (36.8 C)     Temp Source 01/15/21 0827 Oral     SpO2 01/15/21 0827 98 %     Weight --      Height --      Head Circumference --      Peak Flow --      Pain Score 01/15/21 0826 5     Pain Loc --      Pain Edu? --      Excl. in GC? --   No data found.  Updated Vital Signs BP (!) 145/97 (BP Location: Left Arm)   Pulse 91   Temp 97.6 F (36.4 C) (Oral)   Resp 16   SpO2 96%   Physical Exam Vitals and nursing note reviewed.  Constitutional:      General: He is not in acute distress.    Appearance: Normal appearance. He is normal weight. He is not ill-appearing.  HENT:     Head: Normocephalic and atraumatic.  Eyes:     Extraocular Movements: Extraocular movements intact.     Conjunctiva/sclera: Conjunctivae normal.     Pupils: Pupils are equal, round, and reactive to light.  Cardiovascular:     Rate and Rhythm: Normal rate and regular rhythm.  Pulmonary:     Effort: Pulmonary effort is normal.     Breath sounds: Normal breath sounds.  Abdominal:     General: Abdomen is flat. Bowel sounds are normal.     Palpations: Abdomen is soft.     Tenderness: There is abdominal tenderness in the epigastric area.  Musculoskeletal:        General: Normal range of motion.     Cervical back: Normal range of motion and neck supple.  Skin:    General: Skin is warm and dry.  Neurological:     General: No focal deficit present.     Mental Status: He is alert and oriented to person, place, and time. Mental status is at baseline.   Psychiatric:        Mood and Affect: Mood normal.        Behavior: Behavior normal.        Thought Content: Thought content normal.  Judgment: Judgment normal.     Visual Acuity Right Eye Distance:   Left Eye Distance:   Bilateral Distance:    Right Eye Near:   Left Eye Near:    Bilateral Near:     UC Couse / Diagnostics / Procedures:     Radiology No results found.  Procedures Procedures (including critical care time) EKG  Pending results:  Labs Reviewed - No data to display  Medications Ordered in UC: Medications - No data to display  UC Diagnoses / Final Clinical Impressions(s)   I have reviewed the triage vital signs and the nursing notes.  Pertinent labs & imaging results that were available during my care of the patient were reviewed by me and considered in my medical decision making (see chart for details).    Final diagnoses:  Gastroesophageal reflux disease with esophagitis, unspecified whether hemorrhage  History of bleeding peptic ulcer   EKG today is not concerning for acute cardiac event, patient advised of left anterior fascicular block.  Patient was assisted in finding new PCP.  Patient provided with Carafate to take 4 times daily as needed for chest discomfort related to heartburn.  Patient provided with prescription for Prevacid to take twice daily for the next 3 months to allow lining of his stomach to heal and to follow-up with gastroenterology for repeat EGD.  Patient advised not to take Prevacid and Carafate within 2 hours of each other.  Return precautions advised.  Patient vies go the emergency room if he begins to vomit blood.  ED Prescriptions     Medication Sig Dispense Auth. Provider   sucralfate (CARAFATE) 1 GM/10ML suspension Take 10 mLs (1 g total) by mouth 4 (four) times daily as needed. 414 mL Theadora RamaMorgan, Skii Cleland Scales, PA-C   lansoprazole (PREVACID) 30 MG capsule Take 1 capsule (30 mg total) by mouth 2 (two) times daily before a  meal. 30 minutes prior to breakfast meal 60 capsule Theadora RamaMorgan, Edee Nifong Scales, PA-C      PDMP not reviewed this encounter.  Pending results:  Labs Reviewed - No data to display  Discharge Instructions:   Discharge Instructions      Please discontinue omeprazole at this time.  I sent a prescription for a medication called Prevacid to your pharmacy.  I would like for you to take it twice daily with meals, preferably your first meal of the day your last meal of the day.  For short-term relief of your symptoms, I provided you with a prescription for Carafate.  Please be mindful not to take this medication less than 1 hour before taking Prevacid or less than 2 hours after taking Prevacid.  This medication can interact with Prevacid and prevent your body from absorbing it.  Please consider elevating the head of your bed by about 2 inches by using a brick or small block.  Please be mindful to not eat or drink anything 2 hours before you lie down to go to sleep at night.  Please do your best to decrease your alcohol intake for the next week or so to allow lining of your stomach to completely heal.  Please follow-up with your primary care physician to discuss referral to gastroenterology for repeat EGD and discussion of possible progression of your hiatal hernia that might now require repair.  Thank you for visiting us here in urgent care today.    Disposition Upon Discharge:  Condition: stable for discharge home  Patient presented with an acute illness with associated systemic symptoms and  significant discomfort requiring urgent management. In my opinion, this is a condition that a prudent lay person (someone who possesses an average knowledge of health and medicine) may potentially expect to result in complications if not addressed urgently such as respiratory distress, impairment of bodily function or dysfunction of bodily organs.   Routine symptom specific, illness specific and/or  disease specific instructions were discussed with the patient and/or caregiver at length.   As such, the patient has been evaluated and assessed, work-up was performed and treatment was provided in alignment with urgent care protocols and evidence based medicine.  Patient/parent/caregiver has been advised that the patient may require follow up for further testing and treatment if the symptoms continue in spite of treatment, as clinically indicated and appropriate.  Patient/parent/caregiver has been advised to return to the [facility] or PCP if no better; to PCP or the Emergency Department if new signs and symptoms develop, or if the current signs or symptoms continue to change or worsen for further workup, evaluation and treatment as clinically indicated and appropriate  The patient will follow up with their current PCP if and as advised. If the patient does not currently have a PCP we will assist them in obtaining one.   The patient may need specialty follow up if the symptoms continue, in spite of conservative treatment and management, for further workup, evaluation, consultation and treatment as clinically indicated and appropriate.   Patient/parent/caregiver verbalized understanding and agreement of plan as discussed.  All questions were addressed during visit.  Please see discharge instructions below for further details of plan.  This office note has been dictated using Teaching laboratory technician.  Unfortunately, this method of dictation can sometimes lead to typographical or grammatical errors.  I apologize for your inconvenience in advance if this occurs.  Please do not hesitate to reach out to me if clarification is needed.      Theadora Rama Scales, [place] 02/06/22 [id number]

## 2022-02-07 ENCOUNTER — Ambulatory Visit: Payer: Medicare Other | Admitting: Family Medicine

## 2022-02-07 ENCOUNTER — Encounter: Payer: Self-pay | Admitting: Family Medicine

## 2022-02-07 ENCOUNTER — Ambulatory Visit (INDEPENDENT_AMBULATORY_CARE_PROVIDER_SITE_OTHER): Payer: Medicare Other | Admitting: Family Medicine

## 2022-02-07 VITALS — BP 124/86 | HR 89 | Temp 98.1°F | Resp 18 | Ht 72.0 in | Wt 181.8 lb

## 2022-02-07 DIAGNOSIS — F32A Depression, unspecified: Secondary | ICD-10-CM | POA: Diagnosis not present

## 2022-02-07 DIAGNOSIS — M479 Spondylosis, unspecified: Secondary | ICD-10-CM

## 2022-02-07 DIAGNOSIS — K219 Gastro-esophageal reflux disease without esophagitis: Secondary | ICD-10-CM | POA: Diagnosis not present

## 2022-02-07 DIAGNOSIS — N529 Male erectile dysfunction, unspecified: Secondary | ICD-10-CM

## 2022-02-07 DIAGNOSIS — F411 Generalized anxiety disorder: Secondary | ICD-10-CM

## 2022-02-07 DIAGNOSIS — F339 Major depressive disorder, recurrent, unspecified: Secondary | ICD-10-CM

## 2022-02-07 DIAGNOSIS — F419 Anxiety disorder, unspecified: Secondary | ICD-10-CM

## 2022-02-07 DIAGNOSIS — E785 Hyperlipidemia, unspecified: Secondary | ICD-10-CM

## 2022-02-07 MED ORDER — ROSUVASTATIN CALCIUM 10 MG PO TABS: 10.0000 mg | ORAL_TABLET | Freq: Every day | ORAL | 1 refills | Status: DC

## 2022-02-07 MED ORDER — PREDNISONE 20 MG PO TABS: 40.0000 mg | ORAL_TABLET | Freq: Every day | ORAL | 0 refills | Status: DC

## 2022-02-07 MED ORDER — CYCLOBENZAPRINE HCL 5 MG PO TABS: 5.0000 mg | ORAL_TABLET | Freq: Three times a day (TID) | ORAL | 1 refills | Status: DC | PRN

## 2022-02-07 MED ORDER — DICLOFENAC SODIUM 1 % EX GEL: 4.0000 g | Freq: Four times a day (QID) | CUTANEOUS | 3 refills | Status: DC

## 2022-02-07 MED ORDER — TADALAFIL 10 MG PO TABS: 10.0000 mg | ORAL_TABLET | Freq: Every day | ORAL | 1 refills | Status: DC | PRN

## 2022-02-07 MED ORDER — VENLAFAXINE HCL ER 75 MG PO CP24: 225.0000 mg | ORAL_CAPSULE | Freq: Every day | ORAL | 3 refills | Status: DC

## 2022-02-07 NOTE — Assessment & Plan Note (Signed)
No recent labs. Admits he doesn't take Crestor regularly but agrees to start. He declines any labs today. Plan to f/u in 3 months and check labs.  Lifestyle factors for lowering cholesterol include: Diet therapy - heart-healthy diet rich in fruits, veggies, fiber-rich whole grains, lean meats, chicken, fish (at least twice a week), fat-free or 1% dairy products; foods low in saturated/trans fats, cholesterol, sodium, and sugar. Mediterranean diet has shown to be very heart healthy. Regular exercise - recommend at least 30 minutes a day, 5 times per week Weight management

## 2022-02-07 NOTE — Patient Instructions (Addendum)
BACK PAIN AVS  For many people, back pain returns. Since low back pain is rarely dangerous, it is often a condition that people can learn to manage on their own.  Remain active. It is stressful on the back to sit or stand in one place. Do not sit, drive, or stand in one place for more than 30 minutes at a time. Take short walks on level surfaces as soon as pain allows. Try to increase the length of time you walk each day.  Do not stay in bed. Resting more than 1 or 2 days can delay your recovery.  Do not avoid exercise or work. Your body is made to move. It is not dangerous to be active, even though your back may hurt. Your back will likely heal faster if you return to being active before your pain is gone.  Pay attention to your body when you  bend and lift. Many people have less discomfort when lifting if they bend their knees, keep the load close to their bodies, and avoid twisting. Often, the most comfortable positions are those that put less stress on your recovering back.  Find a comfortable position to sleep. Use a firm mattress and lie on your side with your knees slightly bent. If you lie on your back, put a pillow under your knees.  Only take over-the-counter or prescription medicines as directed by your caregiver. Over-the-counter medicines to reduce pain and inflammation are often the most helpful. Your caregiver may prescribe muscle relaxant drugs. These medicines help dull your pain so you can more quickly return to your normal activities and healthy exercise.  Put ice on the injured area.  Put ice in a plastic bag.  Place a towel between your skin and the bag.  Leave the ice on for 15 to 20 minutes, 3 to 4 times a day for the first 2 to 3 days. After that, ice and heat may be alternated to reduce pain and spasms.  Ask your caregiver about trying back exercises and gentle massage. This may be of some benefit.  Avoid feeling anxious or stressed. Stress increases muscle tension and can  worsen back pain. It is important to recognize when you are anxious or stressed and learn ways to manage it. Exercise is a great option.  SEEK IMMEDIATE MEDICAL CARE IF:  You have pain that radiates from your back into your legs.  You develop new bowel or bladder control problems.  You have unusual weakness or numbness in your arms or legs.  You develop nausea or vomiting.  You develop abdominal pain.  You feel faint.    BACK PAIN PLAN Giving steroid burst (do not take diclofenac or any other antiinflammatories while you are on this) and muscle relaxer; consider over-the-counter lidocaine patches Plan of rest, intermittent application of cold packs (later, may switch to heat, but do not sleep on heating pad) Home exercises discussed. Handout provided.  Proper lifting mechanics with avoidance of heavy lifting discussed.  Recommended Physical Therapy -  referral placed Consider XRay studies and referral  if not improving

## 2022-02-07 NOTE — Assessment & Plan Note (Signed)
Stable on Effexor. No SI/HI. Discussed cutting back alcohol. No new concerns today. Declines counseling.

## 2022-02-07 NOTE — Progress Notes (Signed)
New Patient Office Visit  Subjective    Patient ID: Nicholas Yates, male    DOB: [birth year]  Age: 66 y.o. MRN: [id number]  CC: establish care   HPI [name] presents to establish care. He has been struggling with reflux lately. Also reports history of ED, hyperlipidemia, anxiety, depression, PTSD, Bipolar 2, and chronic back pain.   GERD: He was at Urgent Care on 02/05/2022 for reflux. EKG stable. He was prescribed Carafate QID PRN and Prevacid BID for 3 months. They recommend GI follow-up for repeat EGD. Today he reports these meds are working well. - He had been previously on omeprazole 40 mg daily, but recently has been taking multiple tabs/day the past few weeks due to worsening symptoms (ended up running out for about a week). He reports daily alcohol intake (3-4 cans of beer per day). About 10 years ago he had an EGD with a bleeding peptic ulcer and hiatal hernia. Discomfort is not worse after eating and is primarily noticeable when lying flat. - Denies any tarry stools or blood in stool, no vomiting, abdominal pain  Erectile Dysfunction: - Doing well with Cialis, no side effects   Anxiety/Depression/PTSD/Bipolar 2: - Effexor - feels like he is managing well - No SI/HI - doesn't like large crowds and feels like he is easily irritable   HYPERLIPIDEMIA - medications: Crestor 10 mg daily - compliance: variable - medication SEs: n/a The 10-year ASCVD risk score (Arnett DK, et al., 2019) is: 10.7%   Values used to calculate the score:     Age: 56 years     Sex: Male     Is Non-Hispanic African American: No     Diabetic: No     Tobacco smoker: No     Systolic Blood Pressure: 124 mmHg     Is BP treated: No     HDL Cholesterol: 79 mg/dL     Total Cholesterol: 163 mg/dL  Back pain: - diclofenac has not been very helpful recently - always feeling achy, gets stiff - 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   diclofenac (VOLTAREN) 75 MG EC tablet Take 1 tablet (75 mg total) by mouth 2 (two) times daily.   lansoprazole (PREVACID) 30 MG capsule Take 1 capsule (30 mg total) by mouth 2 (two) times daily before a meal. 30 minutes prior to breakfast meal   predniSONE (DELTASONE) 20 MG tablet Take 2 tablets (40 mg total) by mouth daily with breakfast for 5 days.   sucralfate (CARAFATE) 1 GM/10ML suspension Take 10 mLs (1 g total) by mouth 4 (four) times daily as needed.   [DISCONTINUED] dexamethasone (DECADRON) 2 MG tablet Take 4 tablets for 3 days then 3 tablets for 3 days then 2 tablets for 3 days then 1 tablet for 3 days   [DISCONTINUED] diclofenac Sodium (VOLTAREN) 1 % GEL Apply 4 g topically 4 (four) times daily.   [DISCONTINUED] QUEtiapine (SEROQUEL) 100 MG tablet Take 1 tablet (100 mg total) by mouth at bedtime.   [DISCONTINUED] rosuvastatin (CRESTOR) 10 MG tablet Take 1 tablet (10 mg total) by mouth daily.   [  DISCONTINUED] tadalafil (CIALIS) 10 MG tablet Take 1 tablet (10 mg total) by mouth daily as needed for erectile dysfunction.   [DISCONTINUED] venlafaxine XR (EFFEXOR-XR) 75 MG 24 hr capsule Take 3 capsules (225 mg total) by mouth daily with breakfast.   diclofenac Sodium (VOLTAREN) 1 % GEL Apply 4 g topically 4 (four) times daily.   rosuvastatin (CRESTOR) 10 MG tablet Take 1 tablet (10 mg total) by mouth daily.   tadalafil (CIALIS) 10 MG tablet Take 1 tablet (10 mg total) by mouth daily as needed for erectile dysfunction.   venlafaxine XR (EFFEXOR-XR) 75 MG 24 hr capsule Take 3 capsules (225 mg total) by mouth daily with breakfast.   No facility-administered encounter medications on file as of 02/07/2022.    Past  Medical History:  Diagnosis Date   Depression    GERD (gastroesophageal reflux disease)    had duodenal ulcer 5 months   Post traumatic stress disorder     Past Surgical History:  Procedure Laterality Date   HERNIA REPAIR     TOTAL HIP ARTHROPLASTY     bilateral    No family history on file.  Social History   Socioeconomic History   Marital status: Single    Spouse name: Not on file   Number of children: Not on file   Years of education: Not on file   Highest education level: Not on file  Occupational History   Not on file  Tobacco Use   Smoking status: Never   Smokeless tobacco: Never  Vaping Use   Vaping Use: Never used  Substance and Sexual Activity   Alcohol use: Yes    Alcohol/week: 42.0 standard drinks of alcohol    Types: 42 Cans of beer per week   Drug use: No   Sexual activity: Yes  Other Topics Concern   Not on file  Social History Narrative   Not on file   Social Determinants of Health   Financial Resource Strain: Not on file  Food Insecurity: Not on file  Transportation Needs: Not on file  Physical Activity: Not on file  Stress: Not on file  Social Connections: Not on file  Intimate Partner Violence: Not on file    ROS All review of systems negative except what is listed in the HPI      Objective    BP 124/86 (BP Location: Right Arm, Patient Position: Sitting, Cuff Size: Normal)   Pulse 89   Temp 98.1 F (36.7 C) (Oral)   Resp 18   Ht 6' (1.829 m)   Wt 181 lb 12.8 oz (82.5 kg)   SpO2 97%   BMI 24.66 kg/m   Physical Exam Vitals reviewed.  Constitutional:      General: He is not in acute distress.    Appearance: Normal appearance. He is normal weight. He is not ill-appearing.  Cardiovascular:     Rate and Rhythm: Normal rate and regular rhythm.  Pulmonary:     Effort: Pulmonary effort is normal.     Breath sounds: Normal breath sounds.  Skin:    General: Skin is warm and dry.  Neurological:     General: No focal deficit  present.     Mental Status: He is alert and oriented to person, place, and time. Mental status is at baseline.  Psychiatric:        Mood and Affect: Mood normal.        Behavior: Behavior normal.        Thought Content: Thought  content normal.        Judgment: Judgment normal.         Assessment & Plan:   Problem List Items Addressed This Visit       Other   Depression, recurrent (HCC)    Stable on Effexor. No SI/HI. Discussed cutting back alcohol. No new concerns today. Declines counseling.       Relevant Medications   venlafaxine XR (EFFEXOR-XR) 75 MG 24 hr capsule   Generalized anxiety disorder    Stable on Effexor. No SI/HI. Declines counseling.       Relevant Medications   venlafaxine XR (EFFEXOR-XR) 75 MG 24 hr capsule   ED (erectile dysfunction)   Relevant Medications   tadalafil (CIALIS) 10 MG tablet   Dyslipidemia    No recent labs. Admits he doesn't take Crestor regularly but agrees to start. He declines any labs today. Plan to f/u in 3 months and check labs.  Lifestyle factors for lowering cholesterol include: Diet therapy - heart-healthy diet rich in fruits, veggies, fiber-rich whole grains, lean meats, chicken, fish (at least twice a week), fat-free or 1% dairy products; foods low in saturated/trans fats, cholesterol, sodium, and sugar. Mediterranean diet has shown to be very heart healthy. Regular exercise - recommend at least 30 minutes a day, 5 times per week Weight management        Relevant Medications   rosuvastatin (CRESTOR) 10 MG tablet   Other Visit Diagnoses     Gastroesophageal reflux disease, unspecified whether esophagitis present    -  Primary Doing well on new regimen. Discussed lifestyle modifications and encouraged reducing alcohol intake.  GI referral placed for endoscopy.    Relevant Orders   Ambulatory referral to Gastroenterology   Anxiety and depression       Relevant Medications   venlafaxine XR (EFFEXOR-XR) 75 MG 24 hr  capsule   Osteoarthritis of lower back     Adding prednisone and flexeril. Medication safety discussed. Advised caution with NSAIDs after finishing prednisone.  Supportive measures discussed, added to AVS. PT referral placed.    Relevant Medications   cyclobenzaprine (FLEXERIL) 5 MG tablet   predniSONE (DELTASONE) 20 MG tablet   diclofenac Sodium (VOLTAREN) 1 % GEL   Other Relevant Orders   Ambulatory referral to Physical Therapy       Return in about 3 months (around 05/10/2022) for routine f/u, labs .   [name], NP

## 2022-02-07 NOTE — Assessment & Plan Note (Signed)
Stable on Effexor. No SI/HI. Declines counseling.

## 2022-02-12 ENCOUNTER — Other Ambulatory Visit: Payer: Self-pay

## 2022-02-12 ENCOUNTER — Encounter (HOSPITAL_COMMUNITY): Payer: Self-pay

## 2022-02-12 ENCOUNTER — Encounter (HOSPITAL_BASED_OUTPATIENT_CLINIC_OR_DEPARTMENT_OTHER): Payer: Self-pay | Admitting: Emergency Medicine

## 2022-02-12 ENCOUNTER — Observation Stay (HOSPITAL_BASED_OUTPATIENT_CLINIC_OR_DEPARTMENT_OTHER)
Admission: EM | Admit: 2022-02-12 | Discharge: 2022-02-14 | Disposition: A | Discharge status: Home / Self Care | Payer: Medicare Other | Attending: Family Medicine | Admitting: Family Medicine

## 2022-02-12 ENCOUNTER — Emergency Department (HOSPITAL_BASED_OUTPATIENT_CLINIC_OR_DEPARTMENT_OTHER): Payer: Medicare Other

## 2022-02-12 DIAGNOSIS — K269 Duodenal ulcer, unspecified as acute or chronic, without hemorrhage or perforation: Secondary | ICD-10-CM | POA: Diagnosis present

## 2022-02-12 DIAGNOSIS — Z96643 Presence of artificial hip joint, bilateral: Secondary | ICD-10-CM | POA: Insufficient documentation

## 2022-02-12 DIAGNOSIS — G8929 Other chronic pain: Secondary | ICD-10-CM | POA: Insufficient documentation

## 2022-02-12 DIAGNOSIS — Z789 Other specified health status: Secondary | ICD-10-CM

## 2022-02-12 DIAGNOSIS — R079 Chest pain, unspecified: Secondary | ICD-10-CM | POA: Diagnosis not present

## 2022-02-12 DIAGNOSIS — K922 Gastrointestinal hemorrhage, unspecified: Secondary | ICD-10-CM | POA: Diagnosis not present

## 2022-02-12 DIAGNOSIS — K921 Melena: Secondary | ICD-10-CM | POA: Diagnosis not present

## 2022-02-12 DIAGNOSIS — M549 Dorsalgia, unspecified: Secondary | ICD-10-CM | POA: Diagnosis not present

## 2022-02-12 DIAGNOSIS — F411 Generalized anxiety disorder: Secondary | ICD-10-CM | POA: Diagnosis present

## 2022-02-12 DIAGNOSIS — K264 Chronic or unspecified duodenal ulcer with hemorrhage: Secondary | ICD-10-CM | POA: Diagnosis not present

## 2022-02-12 DIAGNOSIS — D62 Acute posthemorrhagic anemia: Secondary | ICD-10-CM | POA: Insufficient documentation

## 2022-02-12 DIAGNOSIS — F339 Major depressive disorder, recurrent, unspecified: Secondary | ICD-10-CM | POA: Diagnosis present

## 2022-02-12 DIAGNOSIS — K3189 Other diseases of stomach and duodenum: Secondary | ICD-10-CM | POA: Insufficient documentation

## 2022-02-12 DIAGNOSIS — K254 Chronic or unspecified gastric ulcer with hemorrhage: Secondary | ICD-10-CM | POA: Insufficient documentation

## 2022-02-12 DIAGNOSIS — R0602 Shortness of breath: Secondary | ICD-10-CM | POA: Diagnosis present

## 2022-02-12 DIAGNOSIS — K2980 Duodenitis without bleeding: Secondary | ICD-10-CM | POA: Diagnosis not present

## 2022-02-12 DIAGNOSIS — K449 Diaphragmatic hernia without obstruction or gangrene: Secondary | ICD-10-CM | POA: Insufficient documentation

## 2022-02-12 DIAGNOSIS — Z79899 Other long term (current) drug therapy: Secondary | ICD-10-CM | POA: Insufficient documentation

## 2022-02-12 DIAGNOSIS — E785 Hyperlipidemia, unspecified: Secondary | ICD-10-CM | POA: Diagnosis present

## 2022-02-12 DIAGNOSIS — K259 Gastric ulcer, unspecified as acute or chronic, without hemorrhage or perforation: Secondary | ICD-10-CM | POA: Insufficient documentation

## 2022-02-12 DIAGNOSIS — F1010 Alcohol abuse, uncomplicated: Secondary | ICD-10-CM | POA: Diagnosis not present

## 2022-02-12 DIAGNOSIS — F4310 Post-traumatic stress disorder, unspecified: Secondary | ICD-10-CM | POA: Diagnosis present

## 2022-02-12 DIAGNOSIS — K5711 Diverticulosis of small intestine without perforation or abscess with bleeding: Principal | ICD-10-CM | POA: Insufficient documentation

## 2022-02-12 DIAGNOSIS — D649 Anemia, unspecified: Secondary | ICD-10-CM | POA: Insufficient documentation

## 2022-02-12 LAB — BASIC METABOLIC PANEL
Anion gap: 10 (ref 5–15)
BUN: 18 mg/dL (ref 8–23)
CO2: 19 mmol/L — ABNORMAL LOW (ref 22–32)
Calcium: 8.5 mg/dL — ABNORMAL LOW (ref 8.9–10.3)
Chloride: 106 mmol/L (ref 98–111)
Creatinine, Ser: 1.14 mg/dL (ref 0.61–1.24)
GFR, Estimated: >60 mL/min (ref >60)
Glucose, Bld: 103 mg/dL — ABNORMAL HIGH (ref 70–99)
Potassium: 3.5 mmol/L (ref 3.5–5.1)
Sodium: 135 mmol/L (ref 135–145)

## 2022-02-12 LAB — PROTIME-INR
INR: 1.1 (ref 0.8–1.2)
Prothrombin Time: 14 seconds (ref 11.4–15.2)

## 2022-02-12 LAB — HEPATIC FUNCTION PANEL
ALT: 13 U/L (ref 0–44)
AST: 26 U/L (ref 15–41)
Albumin: 3.3 g/dL — ABNORMAL LOW (ref 3.5–5.0)
Alkaline Phosphatase: 66 U/L (ref 38–126)
Bilirubin, Direct: <0.1 mg/dL (ref 0.0–0.2)
Total Bilirubin: 0.3 mg/dL (ref 0.3–1.2)
Total Protein: 6.7 g/dL (ref 6.5–8.1)

## 2022-02-12 LAB — CBC
HCT: 25.7 % — ABNORMAL LOW (ref 39.0–52.0)
Hemoglobin: 8.2 g/dL — ABNORMAL LOW (ref 13.0–17.0)
MCH: 30.9 pg (ref 26.0–34.0)
MCHC: 31.9 g/dL (ref 30.0–36.0)
MCV: 97 fL (ref 80.0–100.0)
Platelets: 461 10*3/uL — ABNORMAL HIGH (ref 150–400)
RBC: 2.65 MIL/uL — ABNORMAL LOW (ref 4.22–5.81)
RDW: 19.7 % — ABNORMAL HIGH (ref 11.5–15.5)
WBC: 10.8 10*3/uL — ABNORMAL HIGH (ref 4.0–10.5)
nRBC: 0 % (ref 0.0–0.2)

## 2022-02-12 LAB — TROPONIN I (HIGH SENSITIVITY)
Troponin I (High Sensitivity): 3 ng/L (ref <18)
Troponin I (High Sensitivity): 3 ng/L (ref <18)

## 2022-02-12 LAB — LIPASE, BLOOD: Lipase: 34 U/L (ref 11–51)

## 2022-02-12 LAB — HEMOGLOBIN AND HEMATOCRIT, BLOOD
HCT: 24.7 % — ABNORMAL LOW (ref 39.0–52.0)
HCT: 23.4 % — ABNORMAL LOW (ref 39.0–52.0)
Hemoglobin: 7.7 g/dL — ABNORMAL LOW (ref 13.0–17.0)
Hemoglobin: 7.3 g/dL — ABNORMAL LOW (ref 13.0–17.0)

## 2022-02-12 LAB — OCCULT BLOOD X 1 CARD TO LAB, STOOL: Fecal Occult Bld: POSITIVE — AB

## 2022-02-12 MED ORDER — PANTOPRAZOLE SODIUM 40 MG IV SOLR
40.0000 mg | Freq: Once | INTRAVENOUS | Status: AC
  Administered 2022-02-12: 40 mg via INTRAVENOUS
  Filled 2022-02-12: qty 10

## 2022-02-12 MED ORDER — ADULT MULTIVITAMIN W/MINERALS CH
1.0000 | ORAL_TABLET | Freq: Every day | ORAL | Status: DC
  Administered 2022-02-12 – 2022-02-14 (×3): 1 via ORAL
  Filled 2022-02-12 (×3): qty 1

## 2022-02-12 MED ORDER — ACETAMINOPHEN 325 MG PO TABS: 650.0000 mg | ORAL_TABLET | Freq: Four times a day (QID) | ORAL | Status: DC | PRN

## 2022-02-12 MED ORDER — VENLAFAXINE HCL ER 75 MG PO CP24
225.0000 mg | ORAL_CAPSULE | Freq: Every day | ORAL | Status: DC
  Administered 2022-02-13 – 2022-02-14 (×2): 225 mg via ORAL
  Filled 2022-02-12 (×2): qty 1

## 2022-02-12 MED ORDER — SODIUM CHLORIDE 0.9 % IV SOLN: INTRAVENOUS | Status: DC

## 2022-02-12 MED ORDER — ROSUVASTATIN CALCIUM 10 MG PO TABS
10.0000 mg | ORAL_TABLET | Freq: Every day | ORAL | Status: DC
  Administered 2022-02-12 – 2022-02-14 (×3): 10 mg via ORAL
  Filled 2022-02-12 (×3): qty 1

## 2022-02-12 MED ORDER — LORAZEPAM 1 MG PO TABS: 1.0000 mg | ORAL_TABLET | ORAL | Status: DC | PRN

## 2022-02-12 MED ORDER — ORAL CARE MOUTH RINSE: 15.0000 mL | OROMUCOSAL | Status: DC | PRN

## 2022-02-12 MED ORDER — LORAZEPAM 2 MG/ML IJ SOLN: 1.0000 mg | INTRAMUSCULAR | Status: DC | PRN

## 2022-02-12 MED ORDER — THIAMINE MONONITRATE 100 MG PO TABS
100.0000 mg | ORAL_TABLET | Freq: Every day | ORAL | Status: DC
  Administered 2022-02-12 – 2022-02-14 (×3): 100 mg via ORAL
  Filled 2022-02-12 (×3): qty 1

## 2022-02-12 MED ORDER — PANTOPRAZOLE 80MG IVPB - SIMPLE MED
80.0000 mg | Freq: Two times a day (BID) | INTRAVENOUS | Status: DC
  Administered 2022-02-12: 80 mg via INTRAVENOUS
  Filled 2022-02-12: qty 100
  Filled 2022-02-12: qty 80

## 2022-02-12 MED ORDER — OXYCODONE HCL 5 MG PO TABS
5.0000 mg | ORAL_TABLET | ORAL | Status: DC | PRN
  Administered 2022-02-12 – 2022-02-14 (×6): 5 mg via ORAL
  Filled 2022-02-12 (×7): qty 1

## 2022-02-12 MED ORDER — FOLIC ACID 1 MG PO TABS
1.0000 mg | ORAL_TABLET | Freq: Every day | ORAL | Status: DC
  Administered 2022-02-12 – 2022-02-14 (×3): 1 mg via ORAL
  Filled 2022-02-12 (×3): qty 1

## 2022-02-12 MED ORDER — THIAMINE HCL 100 MG/ML IJ SOLN: 100.0000 mg | Freq: Every day | INTRAMUSCULAR | Status: DC

## 2022-02-12 MED ORDER — ACETAMINOPHEN 650 MG RE SUPP: 650.0000 mg | Freq: Four times a day (QID) | RECTAL | Status: DC | PRN

## 2022-02-12 MED ORDER — CALCIUM CARBONATE ANTACID 500 MG PO CHEW
1.0000 | CHEWABLE_TABLET | Freq: Three times a day (TID) | ORAL | Status: DC | PRN
  Administered 2022-02-12: 200 mg via ORAL
  Filled 2022-02-12: qty 1

## 2022-02-12 NOTE — H&P (Signed)
History and Physical    Patient: Nicholas Yates ELF:[id number] DOB: June 03, 1955 DOA: 02/12/2022 DOS: the patient was seen and examined on 02/12/2022 PCP: [name], NP  Patient coming from: Home  Chief Complaint:  Chief Complaint  Patient presents with   Chest Pain   HPI: Nicholas Yates is a 66 y.o. male with medical history significant of PTSD, anxiety/depression, GERD, PUD, HLD. Presenting with chest pain. He reports that he's had about 3 weeks of fatigue. He generally seemed weaker and was having shortness of breath when trying to get around. Then about 10 days ago, he started having severe burning chest pain. He became concerned and went to urgent care. They gave him carafate and prevacid. He felt much better after those medications were started. However, a few days later, he noticed he was having dark, tarry stools. He began feeling weak again. After nearly passing out in the shower this morning, he decided to come to the ED for evaluation. He reports that he has chronic back pain. He has been taking goody powder daily. He also chronically takes diclofenac; however, that medicine does not work as well as goody powder for him. So he relies more on the goody powder. He denies any other aggravating or alleviating factors.    Review of Systems: As mentioned in the history of present illness. All other systems reviewed and are negative. Past Medical History:  Diagnosis Date   Depression    GERD (gastroesophageal reflux disease)    had duodenal ulcer 5 months   Post traumatic stress disorder    Past Surgical History:  Procedure Laterality Date   HERNIA REPAIR     TOTAL HIP ARTHROPLASTY     bilateral   Social History:  reports that he has never smoked. He has never used smokeless tobacco. He reports current alcohol use of about 42.0 standard drinks of alcohol per week. He reports that he does not use drugs.  No Known Allergies  History reviewed. No pertinent family history.  Prior  to Admission medications   Medication Sig Start Date End Date Taking? Authorizing Provider  acetaminophen (TYLENOL) 500 MG tablet Take 1,000-1,500 mg by mouth in the morning and at bedtime.   Yes [provider]  diclofenac Sodium (VOLTAREN) 1 % GEL Apply 4 g topically 4 (four) times daily. Patient taking differently: Apply 1 Application topically 2 (two) times daily as needed (pain). 02/07/22  Yes [name], NP  lansoprazole (PREVACID) 30 MG capsule Take 1 capsule (30 mg total) by mouth 2 (two) times daily before a meal. 30 minutes prior to breakfast meal Patient taking differently: Take 30 mg by mouth daily. 02/05/22 05/06/22 Yes Theadora Rama Scales, PA-C  OVER THE COUNTER MEDICATION Take 2-3 packets by mouth 3 (three) times daily as needed (back pain). Goody PO   Yes [provider]  predniSONE (DELTASONE) 20 MG tablet Take 2 tablets (40 mg total) by mouth daily with breakfast for 5 days. 02/07/22 02/12/22 Yes [name], NP  sucralfate (CARAFATE) 1 GM/10ML suspension Take 10 mLs (1 g total) by mouth 4 (four) times daily as needed. Patient taking differently: Take 1 g by mouth in the morning, at noon, and at bedtime. 02/05/22  Yes Theadora Rama Scales, PA-C  tadalafil (CIALIS) 10 MG tablet Take 1 tablet (10 mg total) by mouth daily as needed for erectile dysfunction. Patient taking differently: Take 20-30 mg by mouth daily as needed for erectile dysfunction. 02/07/22  Yes [name], NP  venlafaxine XR (EFFEXOR-XR) 75 MG 24 hr capsule Take 3 capsules (225 mg total) by mouth daily with breakfast. 02/07/22  Yes [name], NP  cyclobenzaprine (FLEXERIL) 5 MG tablet Take 1 tablet (5 mg total) by mouth 3 (three) times daily as needed for muscle spasms. Patient not taking: Reported on 02/12/2022 02/07/22   [name], NP  diclofenac (VOLTAREN) 75 MG EC tablet Take 1 tablet (75 mg total) by mouth 2 (two) times daily. Patient not taking: Reported on 02/12/2022  11/01/21   Dettinger, Elige Radon, MD  rosuvastatin (CRESTOR) 10 MG tablet Take 1 tablet (10 mg total) by mouth daily. Patient not taking: Reported on 02/12/2022 02/07/22   [name], NP    Physical Exam: Vitals:   02/12/22 1200 02/12/22 1259 02/12/22 1325 02/12/22 1426  BP: 134/87 134/87 138/70 (!) 141/82  Pulse: 85 85 84 (!) 102  Resp: 17  16 20   Temp: 97.8 F (36.6 C)  97.9 F (36.6 C) 98.5 F (36.9 C)  TempSrc:   Oral Oral  SpO2: 100%  98% 99%  Weight:      Height:       General: 66 y.o. male resting in bed in NAD Eyes: PERRL, normal sclera ENMT: Nares patent w/o discharge, orophaynx clear, dentition normal, ears w/o discharge/lesions/ulcers Neck: Supple, trachea midline Cardiovascular: RRR, +S1, S2, no m/g/r, equal pulses throughout Respiratory: CTABL, no w/r/r, normal WOB GI: BS+, NDNT, no masses noted, no organomegaly noted MSK: No e/c/c Neuro: A&O x 3, no focal deficits Psyc: Appropriate interaction and affect, calm/cooperative  Data Reviewed:  Results for orders placed or performed during the hospital encounter of 02/12/22 (from the past 24 hour(s))  Basic metabolic panel     Status: Abnormal   Collection Time: 02/12/22 10:31 AM  Result Value Ref Range   Sodium 135 135 - 145 mmol/L   Potassium 3.5 3.5 - 5.1 mmol/L   Chloride 106 98 - 111 mmol/L   CO2 19 (L) 22 - 32 mmol/L   Glucose, Bld 103 (H) 70 - 99 mg/dL   BUN 18 8 - 23 mg/dL   Creatinine, Ser 02/14/22 0.61 - 1.24 mg/dL   Calcium 8.5 (L) 8.9 - 10.3 mg/dL   GFR, Estimated 9.56 >38 mL/min   Anion gap 10 5 - 15  CBC     Status: Abnormal   Collection Time: 02/12/22 10:31 AM  Result Value Ref Range   WBC 10.8 (H) 4.0 - 10.5 K/uL   RBC 2.65 (L) 4.22 - 5.81 MIL/uL   Hemoglobin 8.2 (L) 13.0 - 17.0 g/dL   HCT 02/14/22 (L) 64.3 - 32.9 %   MCV 97.0 80.0 - 100.0 fL   MCH 30.9 26.0 - 34.0 pg   MCHC 31.9 30.0 - 36.0 g/dL   RDW 51.8 (H) 84.1 - 66.0 %   Platelets 461 (H) 150 - 400 K/uL   nRBC 0.0 0.0 - 0.2 %  Troponin  I (High Sensitivity)     Status: None   Collection Time: 02/12/22 10:31 AM  Result Value Ref Range   Troponin I (High Sensitivity) 3 <18 ng/L  Hepatic function panel     Status: Abnormal   Collection Time: 02/12/22 10:31 AM  Result Value Ref Range   Total Protein 6.7 6.5 - 8.1 g/dL   Albumin 3.3 (L) 3.5 - 5.0 g/dL   AST 26 15 - 41 U/L   ALT 13 0 - 44 U/L   Alkaline Phosphatase 66 38 - 126 U/L  Total Bilirubin 0.3 0.3 - 1.2 mg/dL   Bilirubin, Direct <9.3 0.0 - 0.2 mg/dL   Indirect Bilirubin NOT CALCULATED 0.3 - 0.9 mg/dL  Lipase, blood     Status: None   Collection Time: 02/12/22 10:31 AM  Result Value Ref Range   Lipase 34 11 - 51 U/L  Protime-INR     Status: None   Collection Time: 02/12/22 10:31 AM  Result Value Ref Range   Prothrombin Time 14.0 11.4 - 15.2 seconds   INR 1.1 0.8 - 1.2  Occult blood card to lab, stool     Status: Abnormal   Collection Time: 02/12/22 10:47 AM  Result Value Ref Range   Fecal Occult Bld POSITIVE (A) NEGATIVE  Troponin I (High Sensitivity)     Status: None   Collection Time: 02/12/22 12:19 PM  Result Value Ref Range   Troponin I (High Sensitivity) 3 <18 ng/L   Assessment and Plan: GIB Symptomatic anemia     - admit to obs, tele     - q6h H&H, transfuse for Hgb < 8     - protonix IV     - fluids     - check orthostatics     - [facility] onboard, appreciate assistance     - likely etiology: NSAID use; counseled against further goody powder/NSAID use     - CLD tonight, NPOpMN  EtOH abuse     - CIWA, MVI, thiamine, folate  Anxiety Depression PTSD     - continue home regimen  Chronic pain     - PRN pain control  HLD     - continue home regimen   Advance Care Planning:   Code Status: FULL  Consults: [facility]  Family Communication: w/ SO at bedside  Severity of Illness: The appropriate patient status for this patient is OBSERVATION. Observation status is judged to be reasonable and necessary in order to provide the required intensity of  service to ensure the patient's safety. The patient's presenting symptoms, physical exam findings, and initial radiographic and laboratory data in the context of their medical condition is felt to place them at decreased risk for further clinical deterioration. Furthermore, it is anticipated that the patient will be medically stable for discharge from the hospital within 2 midnights of admission.   Author: Teddy Spike, DO 02/12/2022 3:52 PM  For on call review [email or web address].

## 2022-02-12 NOTE — ED Provider Notes (Signed)
[facility] EMERGENCY DEPARTMENT Provider Note   CSN: [id number] Arrival date & time: 02/12/22  3212     History  Chief Complaint  Patient presents with   Chest Pain    Nicholas Yates is a 66 y.o. male.  Patient presents to the emergency department for evaluation of weakness and shortness of breath.  Patient was seen at urgent care on 02/05/2022 for GERD like symptoms.  He describes this as burning in his chest.  He had several days of this and was concerned that he could have had a heart attack.  Patient was placed on Carafate and Prevacid after EKG was normal.  He then followed up with [facility] 2 days later for routine care.  Patient states that the burning in his chest has improved, however he continues to feel very weak.  He gets very short of breath after walking about 10 feet and also gets very lightheaded with standing.  States that he nearly passed out in the shower this morning.  He has had black tarry stools recently.  He does take a lot of Goody powder and has been taking diclofenac in the recent past.  No vomiting.        Home Medications Prior to Admission medications   Medication Sig Start Date End Date Taking? Authorizing Provider  cyclobenzaprine (FLEXERIL) 5 MG tablet Take 1 tablet (5 mg total) by mouth 3 (three) times daily as needed for muscle spasms. 02/07/22   [name], NP  diclofenac (VOLTAREN) 75 MG EC tablet Take 1 tablet (75 mg total) by mouth 2 (two) times daily. 11/01/21   Dettinger, Elige Radon, MD  diclofenac Sodium (VOLTAREN) 1 % GEL Apply 4 g topically 4 (four) times daily. 02/07/22   [name], NP  lansoprazole (PREVACID) 30 MG capsule Take 1 capsule (30 mg total) by mouth 2 (two) times daily before a meal. 30 minutes prior to breakfast meal 02/05/22 05/06/22  Theadora Rama Scales, PA-C  predniSONE (DELTASONE) 20 MG tablet Take 2 tablets (40 mg total) by mouth daily with breakfast for 5 days. 02/07/22 02/12/22  [name], NP   rosuvastatin (CRESTOR) 10 MG tablet Take 1 tablet (10 mg total) by mouth daily. 02/07/22   [name], NP  sucralfate (CARAFATE) 1 GM/10ML suspension Take 10 mLs (1 g total) by mouth 4 (four) times daily as needed. 02/05/22   Theadora Rama Scales, PA-C  tadalafil (CIALIS) 10 MG tablet Take 1 tablet (10 mg total) by mouth daily as needed for erectile dysfunction. 02/07/22   [name], NP  venlafaxine XR (EFFEXOR-XR) 75 MG 24 hr capsule Take 3 capsules (225 mg total) by mouth daily with breakfast. 02/07/22   [name], NP      Allergies    Patient has no known allergies.    Review of Systems   Review of Systems  Physical Exam Updated Vital Signs BP 135/72 (BP Location: Left Arm)   Pulse 88   Temp 97.8 F (36.6 C) (Oral)   Resp (!) 24   Ht 6' (1.829 m)   Wt 82.5 kg   SpO2 100%   BMI 24.66 kg/m   Physical Exam Vitals and nursing note reviewed. Exam conducted with a chaperone present.  Constitutional:      General: He is not in acute distress.    Appearance: He is well-developed.  HENT:     Head: Normocephalic and atraumatic.     Nose: Nose normal.  Mouth/Throat:     Mouth: Mucous membranes are moist.  Eyes:     General:        Right eye: No discharge.        Left eye: No discharge.     Comments: Pale conjunctiva  Cardiovascular:     Rate and Rhythm: Normal rate and regular rhythm.     Heart sounds: Normal heart sounds.  Pulmonary:     Effort: Pulmonary effort is normal.     Breath sounds: Normal breath sounds.  Abdominal:     Palpations: Abdomen is soft.     Tenderness: There is no abdominal tenderness.  Genitourinary:    Rectum: No tenderness or external hemorrhoid.     Comments: Grossly melanotic stool, small amount on DRE Musculoskeletal:     Cervical back: Normal range of motion and neck supple.  Skin:    General: Skin is warm and dry.     Coloration: Skin is pale.  Neurological:     Mental Status: He is alert.     ED Results /  Procedures / Treatments   Labs (all labs ordered are listed, but only abnormal results are displayed) Labs Reviewed  BASIC METABOLIC PANEL - Abnormal; Notable for the following components:      Result Value   CO2 19 (*)    Glucose, Bld 103 (*)    Calcium 8.5 (*)    All other components within normal limits  CBC - Abnormal; Notable for the following components:   WBC 10.8 (*)    RBC 2.65 (*)    Hemoglobin 8.2 (*)    HCT 25.7 (*)    RDW 19.7 (*)    Platelets 461 (*)    All other components within normal limits  HEPATIC FUNCTION PANEL - Abnormal; Notable for the following components:   Albumin 3.3 (*)    All other components within normal limits  OCCULT BLOOD X 1 CARD TO LAB, STOOL - Abnormal; Notable for the following components:   Fecal Occult Bld POSITIVE (*)    All other components within normal limits  LIPASE, BLOOD  POC OCCULT BLOOD, ED  TROPONIN I (HIGH SENSITIVITY)  TROPONIN I (HIGH SENSITIVITY)    EKG EKG Interpretation  Date/Time:  Saturday February 12 2022 09:57:08 EST Ventricular Rate:  86 PR Interval:  160 QRS Duration: 86 QT Interval:  378 QTC Calculation: 452 R Axis:   -37 Text Interpretation: Sinus rhythm with marked sinus arrhythmia Left axis deviation Abnormal ECG When compared with ECG of 05-Feb-2022 16:09, Premature atrial contractions now present Confirmed by Vonita Moss [phone]) on 02/12/2022 10:36:18 AM  Radiology No results found.  Procedures Procedures    Medications Ordered in ED Medications  pantoprazole (PROTONIX) injection 40 mg (has no administration in time range)  pantoprazole (PROTONIX) injection 40 mg (40 mg Intravenous Given 02/12/22 1052)    ED Course/ Medical Decision Making/ A&P    Patient seen and examined. History obtained directly from patient.  Rectal exam performed with RN chaperone.  Labs/EKG: Ordered CBC, BMP, troponin per triage protocol.  Added hepatic function panel, lipase, Hemoccult.  Personally reviewed  and interpreted EKG as above.  Imaging: Chest x-ray personally reviewed and interpreted.  Agree no acute cardio or pulmonary abnormality.  Medications/Fluids: Ordered: Protonix  Most recent vital signs reviewed and are as follows: BP 134/88   Pulse 81   Temp 97.8 F (36.6 C)   Resp 14   Ht 6' (1.829 m)   Wt 82.5 kg  SpO2 100%   BMI 24.66 kg/m   Initial impression: Suspect upper GI bleed given patient history.  Will evaluate for cardiac cause of chest pain, however this was very likely related to PUD or gastritis from alcohol and NSAID use.  Orthostatic VS for the past 24 hrs:  BP- Lying Pulse- Lying BP- Sitting Pulse- Sitting BP- Standing at 0 minutes Pulse- Standing at 0 minutes  02/12/22 1053 129/81 75 133/87 82 119/68 96    12:00 PM Reassessment performed. Patient appears stable.  Labs personally reviewed and interpreted including: CBC with hemoglobin 8.2, this is down from 11's earlier in the ER and from normal prior to that, a white blood cell count 10.8, platelets 461; BMP with normal kidney function and electrolytes; hepatic function panel unremarkable; lipase normal; troponin 3; Hemoccult positive.  Reviewed pertinent lab work and imaging with patient at bedside. Questions answered.  Recommended admission, patient agrees.  Most current vital signs reviewed and are as follows: BP 134/88   Pulse 81   Temp 97.8 F (36.6 C)   Resp 14   Ht 6' (1.829 m)   Wt 82.5 kg   SpO2 100%   BMI 24.66 kg/m   Plan: Will speak with [facility] on-call, admit to hospitalist.  12:47 PM I spoke with Dr. Ronaldo Miyamoto earlier of [facility] hospitalist who will accept patient for admission.  Bed request for [employer].  I spoke with Dr. Myrtie Neither of [facility] gastroenterology, given that patient is established with [facility], who recommends 80mg  IV Protonix total, PT/INR, NPO.   CRITICAL CARE Performed by: PA-C Total critical care time: 40 minutes Critical care time was exclusive of  separately billable procedures and treating other patients. Critical care was necessary to treat or prevent imminent or life-threatening deterioration. Critical care was time spent personally by me on the following activities: development of treatment plan with patient and/or surrogate as well as nursing, discussions with consultants, evaluation of patient's response to treatment, examination of patient, obtaining history from patient or surrogate, ordering and performing treatments and interventions, ordering and review of laboratory studies, ordering and review of radiographic studies, pulse oximetry and re-evaluation of patient's condition.                           Medical Decision Making Amount and/or Complexity of Data Reviewed Labs: ordered. Radiology: ordered.   Patient with suspected upper GI bleeding in setting of heavy NSAID and alcohol use.  Patient stable at this current time.  He does have exertional dyspnea and lightheadedness with standing.  Low concern for infectious etiology or bowel perforation.  No current signs of alcohol withdrawal.         Final Clinical Impression(s) / ED Diagnoses Final diagnoses:  Acute upper GI bleeding  Alcohol use    Rx / DC Orders ED Discharge Orders     None         Renne Crigler, PA-C 02/12/22 1250    02/14/22, MD 02/20/22 1202

## 2022-02-12 NOTE — ED Triage Notes (Signed)
Patient c/o left sided chest pain that radiated to right side and down right arm. Patient also reports dizziness and shortness of breath. Patient seen at an urgent care on 11/18 with similar symptoms with no relief.

## 2022-02-12 NOTE — Anesthesia Preprocedure Evaluation (Signed)
Anesthesia Evaluation  Patient identified by MRN, date of birth, ID band Patient awake    Reviewed: Allergy & Precautions, NPO status , Patient's Chart, lab work & pertinent test results  Airway Mallampati: II  TM Distance: >3 FB Neck ROM: Full    Dental no notable dental hx. (+) Loose, Chipped,    Pulmonary shortness of breath   Pulmonary exam normal breath sounds clear to auscultation       Cardiovascular hypertension, Normal cardiovascular exam Rhythm:Regular Rate:Normal     Neuro/Psych  PSYCHIATRIC DISORDERS      PTSDnegative neurological ROS     GI/Hepatic PUD,GERD  ,,  Endo/Other    Renal/GU      Musculoskeletal   Abdominal   Peds  Hematology  (+) Blood dyscrasia, anemia Lab Results      Component                Value               Date                      WBC                      10.8 (H)            02/12/2022                HGB                      8.2 (L)             02/12/2022                HCT                      25.7 (L)            02/12/2022               PLT                      461 (H)             02/12/2022              Anesthesia Other Findings NKDA  Reproductive/Obstetrics                             Anesthesia Physical Anesthesia Plan  ASA: 4  Anesthesia Plan: MAC   Post-op Pain Management:    Induction:   PONV Risk Score and Plan: 2 and Treatment may vary due to age or medical condition  Airway Management Planned: Natural Airway and Nasal Cannula  Additional Equipment: None  Intra-op Plan:   Post-operative Plan:   Informed Consent: I have reviewed the patients History and Physical, chart, labs and discussed the procedure including the risks, benefits and alternatives for the proposed anesthesia with the patient or authorized representative who has indicated his/her understanding and acceptance.     Dental advisory given  Plan Discussed with:    Anesthesia Plan Comments: (EGD for Melena and anemia under MAC)        Anesthesia Quick Evaluation

## 2022-02-12 NOTE — Progress Notes (Signed)
Plan of Care Note for accepted transfer   Patient: Nicholas Yates MRN: [id number]   DOA: 02/12/2022  Facility requesting transfer: [facility] Requesting Provider: Dr. Eloise Harman Reason for transfer: uGIB Facility course: 66 yo M w/ PMHx of anxiety/depression, GERD, PUD, HLD. Presenting w/ dark, tarry stools and shortness of breath. W/u revealed anemia, positive FOBT. EDPA is consulting [facility]. He was started on protonix and fluids.   Plan of care: The patient is accepted for admission to Telemetry unit, at [facility]..  While holding at [facility], medical decision making responsibilities remain with the EDP. Upon arrival to [facility], [facility] will assume care. Thank you.   Author: Teddy Spike, DO 02/12/2022  Check [email or web address] for on-call coverage.  Nursing staff, Please call [facility] Admits & Consults System-Wide number on Amion as soon as patient's arrival, so appropriate admitting provider can evaluate the pt.

## 2022-02-12 NOTE — ED Notes (Signed)
[facility] at bedside 

## 2022-02-13 ENCOUNTER — Observation Stay (HOSPITAL_COMMUNITY): Payer: Medicare Other | Admitting: Anesthesiology

## 2022-02-13 ENCOUNTER — Observation Stay (HOSPITAL_BASED_OUTPATIENT_CLINIC_OR_DEPARTMENT_OTHER): Payer: Medicare Other | Admitting: Anesthesiology

## 2022-02-13 ENCOUNTER — Encounter (HOSPITAL_COMMUNITY): Payer: Self-pay | Admitting: *Deleted

## 2022-02-13 ENCOUNTER — Encounter (HOSPITAL_COMMUNITY)
Admission: EM | Disposition: A | Discharge status: Home / Self Care | Payer: Self-pay | Source: Home / Self Care | Attending: Emergency Medicine

## 2022-02-13 DIAGNOSIS — K921 Melena: Secondary | ICD-10-CM

## 2022-02-13 DIAGNOSIS — D500 Iron deficiency anemia secondary to blood loss (chronic): Secondary | ICD-10-CM | POA: Diagnosis not present

## 2022-02-13 DIAGNOSIS — D649 Anemia, unspecified: Secondary | ICD-10-CM | POA: Diagnosis not present

## 2022-02-13 DIAGNOSIS — Z8719 Personal history of other diseases of the digestive system: Secondary | ICD-10-CM | POA: Diagnosis not present

## 2022-02-13 DIAGNOSIS — K5710 Diverticulosis of small intestine without perforation or abscess without bleeding: Secondary | ICD-10-CM | POA: Diagnosis not present

## 2022-02-13 DIAGNOSIS — I10 Essential (primary) hypertension: Secondary | ICD-10-CM

## 2022-02-13 DIAGNOSIS — K449 Diaphragmatic hernia without obstruction or gangrene: Secondary | ICD-10-CM

## 2022-02-13 DIAGNOSIS — F339 Major depressive disorder, recurrent, unspecified: Secondary | ICD-10-CM | POA: Diagnosis not present

## 2022-02-13 DIAGNOSIS — G8929 Other chronic pain: Secondary | ICD-10-CM

## 2022-02-13 DIAGNOSIS — F4310 Post-traumatic stress disorder, unspecified: Secondary | ICD-10-CM

## 2022-02-13 DIAGNOSIS — D62 Acute posthemorrhagic anemia: Secondary | ICD-10-CM | POA: Diagnosis not present

## 2022-02-13 DIAGNOSIS — K259 Gastric ulcer, unspecified as acute or chronic, without hemorrhage or perforation: Secondary | ICD-10-CM

## 2022-02-13 DIAGNOSIS — E785 Hyperlipidemia, unspecified: Secondary | ICD-10-CM

## 2022-02-13 DIAGNOSIS — K269 Duodenal ulcer, unspecified as acute or chronic, without hemorrhage or perforation: Secondary | ICD-10-CM

## 2022-02-13 DIAGNOSIS — K3189 Other diseases of stomach and duodenum: Secondary | ICD-10-CM | POA: Diagnosis not present

## 2022-02-13 DIAGNOSIS — K2981 Duodenitis with bleeding: Secondary | ICD-10-CM | POA: Diagnosis not present

## 2022-02-13 HISTORY — PX: ESOPHAGOGASTRODUODENOSCOPY (EGD) WITH PROPOFOL: SHX5813

## 2022-02-13 HISTORY — PX: BIOPSY: SHX5522

## 2022-02-13 LAB — CBC WITH DIFFERENTIAL/PLATELET
Abs Immature Granulocytes: 0.02 10*3/uL (ref 0.00–0.07)
Basophils Absolute: 0 10*3/uL (ref 0.0–0.1)
Basophils Relative: 1 %
Eosinophils Absolute: 0 10*3/uL (ref 0.0–0.5)
Eosinophils Relative: 1 %
HCT: 24.2 % — ABNORMAL LOW (ref 39.0–52.0)
Hemoglobin: 7.4 g/dL — ABNORMAL LOW (ref 13.0–17.0)
Immature Granulocytes: 0 %
Lymphocytes Relative: 35 %
Lymphs Abs: 2.3 10*3/uL (ref 0.7–4.0)
MCH: 30.3 pg (ref 26.0–34.0)
MCHC: 30.6 g/dL (ref 30.0–36.0)
MCV: 99.2 fL (ref 80.0–100.0)
Monocytes Absolute: 0.6 10*3/uL (ref 0.1–1.0)
Monocytes Relative: 9 %
Neutro Abs: 3.6 10*3/uL (ref 1.7–7.7)
Neutrophils Relative %: 54 %
Platelets: 359 10*3/uL (ref 150–400)
RBC: 2.44 MIL/uL — ABNORMAL LOW (ref 4.22–5.81)
RDW: 19.8 % — ABNORMAL HIGH (ref 11.5–15.5)
WBC: 6.5 10*3/uL (ref 4.0–10.5)
nRBC: 0 % (ref 0.0–0.2)

## 2022-02-13 LAB — COMPREHENSIVE METABOLIC PANEL
ALT: 10 U/L (ref 0–44)
AST: 17 U/L (ref 15–41)
Albumin: 3.2 g/dL — ABNORMAL LOW (ref 3.5–5.0)
Alkaline Phosphatase: 54 U/L (ref 38–126)
Anion gap: 6 (ref 5–15)
BUN: 15 mg/dL (ref 8–23)
CO2: 23 mmol/L (ref 22–32)
Calcium: 8.4 mg/dL — ABNORMAL LOW (ref 8.9–10.3)
Chloride: 111 mmol/L (ref 98–111)
Creatinine, Ser: 0.95 mg/dL (ref 0.61–1.24)
GFR, Estimated: >60 mL/min (ref >60)
Glucose, Bld: 91 mg/dL (ref 70–99)
Potassium: 3.7 mmol/L (ref 3.5–5.1)
Sodium: 140 mmol/L (ref 135–145)
Total Bilirubin: 0.5 mg/dL (ref 0.3–1.2)
Total Protein: 6.1 g/dL — ABNORMAL LOW (ref 6.5–8.1)

## 2022-02-13 LAB — HEMOGLOBIN AND HEMATOCRIT, BLOOD
HCT: 25 % — ABNORMAL LOW (ref 39.0–52.0)
Hemoglobin: 7.7 g/dL — ABNORMAL LOW (ref 13.0–17.0)

## 2022-02-13 LAB — CBC
HCT: 25 % — ABNORMAL LOW (ref 39.0–52.0)
Hemoglobin: 7.5 g/dL — ABNORMAL LOW (ref 13.0–17.0)
MCH: 30.1 pg (ref 26.0–34.0)
MCHC: 30 g/dL (ref 30.0–36.0)
MCV: 100.4 fL — ABNORMAL HIGH (ref 80.0–100.0)
Platelets: 371 10*3/uL (ref 150–400)
RBC: 2.49 MIL/uL — ABNORMAL LOW (ref 4.22–5.81)
RDW: 20.1 % — ABNORMAL HIGH (ref 11.5–15.5)
WBC: 6.2 10*3/uL (ref 4.0–10.5)
nRBC: 0 % (ref 0.0–0.2)

## 2022-02-13 LAB — HIV ANTIBODY (ROUTINE TESTING W REFLEX): HIV Screen 4th Generation wRfx: NONREACTIVE

## 2022-02-13 LAB — TYPE AND SCREEN
ABO/RH(D): O POS
Antibody Screen: NEGATIVE

## 2022-02-13 LAB — ABO/RH: ABO/RH(D): O POS

## 2022-02-13 SURGERY — ESOPHAGOGASTRODUODENOSCOPY (EGD) WITH PROPOFOL
Anesthesia: Monitor Anesthesia Care

## 2022-02-13 MED ORDER — METHOCARBAMOL 500 MG PO TABS
500.0000 mg | ORAL_TABLET | Freq: Three times a day (TID) | ORAL | Status: DC | PRN
  Administered 2022-02-14: 500 mg via ORAL
  Filled 2022-02-13: qty 1

## 2022-02-13 MED ORDER — PHENYLEPHRINE HCL (PRESSORS) 10 MG/ML IV SOLN
INTRAVENOUS | Status: AC
  Filled 2022-02-13: qty 1

## 2022-02-13 MED ORDER — PROPOFOL 1000 MG/100ML IV EMUL
INTRAVENOUS | Status: AC
  Filled 2022-02-13: qty 400

## 2022-02-13 MED ORDER — PANTOPRAZOLE SODIUM 40 MG PO TBEC
40.0000 mg | DELAYED_RELEASE_TABLET | Freq: Two times a day (BID) | ORAL | Status: DC
  Administered 2022-02-13 – 2022-02-14 (×3): 40 mg via ORAL
  Filled 2022-02-13 (×4): qty 1

## 2022-02-13 MED ORDER — LIDOCAINE 2% (20 MG/ML) 5 ML SYRINGE
INTRAMUSCULAR | Status: DC | PRN
  Administered 2022-02-13: 40 mg via INTRAVENOUS

## 2022-02-13 MED ORDER — PROPOFOL 500 MG/50ML IV EMUL
INTRAVENOUS | Status: DC | PRN
  Administered 2022-02-13: 125 ug/kg/min via INTRAVENOUS

## 2022-02-13 MED ORDER — PROPOFOL 500 MG/50ML IV EMUL
INTRAVENOUS | Status: AC
  Filled 2022-02-13: qty 50

## 2022-02-13 MED ORDER — LACTATED RINGERS IV SOLN: INTRAVENOUS | Status: DC | PRN

## 2022-02-13 MED ORDER — SUCRALFATE 1 G PO TABS
1.0000 g | ORAL_TABLET | Freq: Three times a day (TID) | ORAL | Status: DC
  Administered 2022-02-13 – 2022-02-14 (×4): 1 g via ORAL
  Filled 2022-02-13 (×5): qty 1

## 2022-02-13 MED ORDER — SODIUM CHLORIDE 0.9 % IV SOLN
250.0000 mg | Freq: Every day | INTRAVENOUS | Status: AC
  Administered 2022-02-13 – 2022-02-14 (×2): 250 mg via INTRAVENOUS
  Filled 2022-02-13 (×2): qty 20

## 2022-02-13 MED ORDER — FENTANYL CITRATE PF 50 MCG/ML IJ SOSY
12.5000 ug | PREFILLED_SYRINGE | Freq: Once | INTRAMUSCULAR | Status: AC
  Administered 2022-02-13: 12.5 ug via INTRAVENOUS
  Filled 2022-02-13: qty 1

## 2022-02-13 MED ORDER — SODIUM CHLORIDE 0.9% IV SOLUTION: Freq: Once | INTRAVENOUS | Status: DC

## 2022-02-13 MED ORDER — PROPOFOL 10 MG/ML IV BOLUS
INTRAVENOUS | Status: DC | PRN
  Administered 2022-02-13: 50 mg via INTRAVENOUS

## 2022-02-13 SURGICAL SUPPLY — 15 items

## 2022-02-13 NOTE — Interval H&P Note (Signed)
History and Physical Interval Note:  02/13/2022 7:26 AM  Nicholas Yates  has presented today for surgery, with the diagnosis of Melena and anemia.  The various methods of treatment have been discussed with the patient and family. After consideration of risks, benefits and other options for treatment, the patient has consented to  Procedure(s): ESOPHAGOGASTRODUODENOSCOPY (EGD) WITH PROPOFOL (N/A) as a surgical intervention.  The patient's history has been reviewed, patient examined, no change in status, stable for surgery.  I have reviewed the patient's chart and labs.  Questions were answered to the patient's satisfaction.     [employer]

## 2022-02-13 NOTE — Hospital Course (Addendum)
Nicholas Yates is a 66 y.o. male with a history of PTSD, anxiety, depression, GERD, PUD, hyperlipidemia.  Patient presented secondary to chest/epigastric pain in addition to dyspnea on exertion/fatigue.  History complicated by the development of melanotic stools concerning for acute GI bleeding.  Fecal occult blood testing was positive.  [facility] was consulted and performed upper endoscopy on 11/26 which was significant for nonbleeding gastric and duodenal ulcers; biopsy obtained.  Recommendation for PPI and Carafate on discharge.  Recommendation for repeat EGD in 3 to 4 months in addition to screening colonoscopy. Hemoglobin stable prior to discharge.

## 2022-02-13 NOTE — Transfer of Care (Signed)
Immediate Anesthesia Transfer of Care Note  Patient: Nicholas Yates  Procedure(s) Performed: ESOPHAGOGASTRODUODENOSCOPY (EGD) WITH PROPOFOL BIOPSY  Patient Location: PACU  Anesthesia Type:MAC  Level of Consciousness: awake  Airway & Oxygen Therapy: Patient Spontanous Breathing and Patient connected to face mask oxygen  Post-op Assessment: Report given to RN and Post -op Vital signs reviewed and stable  Post vital signs: Reviewed and stable  Last Vitals:  Vitals Value Taken Time  BP    Temp    Pulse    Resp 23 02/13/22 0805  SpO2    Vitals shown include unvalidated device data.  Last Pain:  Vitals:   02/13/22 0721  TempSrc: Tympanic  PainSc: 0-No pain      Patients Stated Pain Goal: 4 (36/72/55 0016)  Complications: No notable events documented.

## 2022-02-13 NOTE — Progress Notes (Signed)
PROGRESS NOTE    Nicholas Yates  YWV:[id number] DOB: Nov 20, 1955 DOA: 02/12/2022 PCP: [name], NP   Brief Narrative: Nicholas Yates is a 66 y.o. male with a history of PTSD, anxiety, depression, GERD, PUD, hyperlipidemia.  Patient presented secondary to chest/epigastric pain in addition to dyspnea on exertion/fatigue.  History complicated by the development of melanotic stools concerning for acute GI bleeding.  Fecal occult blood testing was positive.  [facility] was consulted and performed upper endoscopy on 11/26 which was significant for nonbleeding gastric and duodenal ulcers; biopsy obtained.  Recommendation for PPI and Carafate on discharge.  Recommendation for repeat EGD in 3 to 4 months in addition to screening colonoscopy.   Assessment and Plan:  Melena Normal BUN.  Concerning for upper GI bleeding especially in setting of PUD and NSAID use history.  [facility] consulted and performed upper endoscopy which was significant for nonbleeding gastric and duodenal ulcers. -GI recommendations: No NSAID use, Protonix 40 mg twice daily, Carafate 3 times daily before every meal and nightly for 1 month followed by twice daily dosing, repeat EGD in 3 to 4 months in addition to screening colonoscopy -CBC in a.m., hemoglobin hematocrit this evening  Epigastric pain Likely secondary to ulcerative disease.  Pain currently resolved.  Peptic ulcer disease Known history.  Complicated by patient's continued use of NSAIDs.  Medication recommendations as mentioned above.  Symptomatic anemia Acute blood loss anemia Secondary to GI bleed. Hemoglobin off 8.2 on admission, down to a nadir of 7.3 and is stable. -Ambulate -If symptomatic, will transfuse 1 unit of PRBC  Chronic back pain Noted. Patient has a history of vertebral compression fracture and is s/p vertebroplasty. Patient continues to use NSAIDs as an outpatient for management of pain. Also complicated by constant lifting/bending at his  job. -Robaxin as needed -Discontinue NSAIDs -PT eval  Hyperlipidemia -Continue Crestor  Alcohol abuse Noted.  No alcohol withdrawal symptoms.  Patient started on CIWA on admission. -Continue CIWA -Continue folic acid, multivitamin, thiamine  Anxiety Depression -Continue Effexor XR  DVT prophylaxis: SCDs Code Status:   Code Status: Full Code Family Communication: Wife at bedside Disposition Plan: Discharge home likely in 24 hours pending improvement of melena, stable hemoglobin   Consultants:  [facility]  Procedures:  Upper endoscopy  Antimicrobials: None    Subjective: Patient reports an episode of melena overnight.  Objective: BP (!) 142/90 (BP Location: Left Arm)   Pulse 74   Temp 97.6 F (36.4 C) (Oral)   Resp 18   Ht 6' (1.829 m)   Wt 82.5 kg   SpO2 100%   BMI 24.67 kg/m   Examination:  General exam: Appears calm and comfortable Respiratory system: Clear to auscultation. Respiratory effort normal. Cardiovascular system: S1 & S2 heard, RRR. Gastrointestinal system: Abdomen is nondistended, soft and nontender. Normal bowel sounds heard.             Central nervous system: Alert and oriented. No focal neurological deficits. Musculoskeletal: No edema. No calf tenderness Skin: No cyanosis. No rashes Psychiatry: Judgement and insight appear normal. Mood & affect appropriate.    Data Reviewed: I have personally reviewed following labs and imaging studies  CBC Lab Results  Component Value Date   WBC 6.2 02/13/2022   RBC 2.49 (L) 02/13/2022   HGB 7.5 (L) 02/13/2022   HCT 25.0 (L) 02/13/2022   MCV 100.4 (H) 02/13/2022   MCH 30.1 02/13/2022   PLT 371 02/13/2022   MCHC 30.0 02/13/2022   RDW  20.1 (H) 02/13/2022   LYMPHSABS 2.3 02/13/2022   MONOABS 0.6 02/13/2022   EOSABS 0.0 02/13/2022   BASOSABS 0.0 [id number]     Last metabolic panel Lab Results  Component Value Date   NA 140 02/13/2022   K 3.7 02/13/2022   CL 111 02/13/2022    CO2 23 02/13/2022   BUN 15 02/13/2022   CREATININE 0.95 02/13/2022   GLUCOSE 91 02/13/2022   GFRNONAA >60 02/13/2022   GFRAA 75 11/11/2019   CALCIUM 8.4 (L) 02/13/2022   PROT 6.1 (L) 02/13/2022   ALBUMIN 3.2 (L) 02/13/2022   LABGLOB 2.2 04/29/2021   AGRATIO 2.0 04/29/2021   BILITOT 0.5 02/13/2022   ALKPHOS 54 02/13/2022   AST 17 02/13/2022   ALT 10 02/13/2022   ANIONGAP 6 02/13/2022    GFR: Estimated Creatinine Clearance: 81.7 mL/min (by C-G formula based on SCr of 0.95 mg/dL).  No results found for this or any previous visit (from the past 240 hour(s)).    Radiology Studies: No results found.    LOS: 0 days    Cordelia Poche, MD [facility] 02/13/2022, 2:09 PM   If 7PM-7AM, please contact night-coverage [email or web address]

## 2022-02-13 NOTE — Anesthesia Postprocedure Evaluation (Signed)
Anesthesia Post Note  Patient: Nicholas Yates  Procedure(s) Performed: ESOPHAGOGASTRODUODENOSCOPY (EGD) WITH PROPOFOL BIOPSY     Patient location during evaluation: Endoscopy Anesthesia Type: MAC Level of consciousness: awake and alert Pain management: pain level controlled Vital Signs Assessment: post-procedure vital signs reviewed and stable Respiratory status: spontaneous breathing, nonlabored ventilation, respiratory function stable and patient connected to nasal cannula oxygen Cardiovascular status: blood pressure returned to baseline and stable Postop Assessment: no apparent nausea or vomiting Anesthetic complications: no  No notable events documented.  Last Vitals:  Vitals:   02/13/22 0821 02/13/22 0827  BP: 105/74 120/70  Pulse: 64 61  Resp: 15 18  Temp: 36.4 C   SpO2: 99% 100%    Last Pain:  Vitals:   02/13/22 0827  TempSrc:   PainSc: 0-No pain                 Barnet Glasgow

## 2022-02-13 NOTE — Consult Note (Signed)
[facility] Consult Note   History Nicholas Yates MRN # [id number]  Date of Admission: 02/12/2022 Date of Consultation: 02/13/2022 Referring physician: Dr. Teddy Spike, DO Primary Care Provider: [name], NP Primary Gastroenterologist: [facility] (none local)   Reason for Consultation/Chief Complaint: Melena with acute blood loss anemia  Subjective  HPI:  66 year old man came to [facility] ED yesterday for progressive dyspnea with epigastric pain and heartburn.  He had been seen at an urgent care for this several days before and established with a new primary care provider on 02/07/2022.  He was treated with Carafate and PPIs with some relief.  He continued to have melena and progressive weakness upon presentation to ED yesterday.  He was found to have a hemoglobin of 8.2 and melena on exam.  He has remained hemodynamically stable since admission and transfer to [facility] overnight. He denies exertional chest pain, but he has become progressively dyspneic and fatigued lately.  He still has some nonspecific epigastric pain and about a week or 2 ago had some nausea and nonbloody vomitus.  Last passage of melena about 8 PM last evening according to the patient's nurse. He takes diclofenac and daily Goody powder for years for chronic back pain.  Records from [facility] clinic found in care everywhere October 2017 indicate patient was anemic at that time with an upper endoscopy showing antral ulcers and a duodenal stricture requiring endoscopic dilation. On 01/08/2016 EGD at that practice revealed active bleeding duodenal ulcer that was injected and clipped.  He had normal colonoscopy the same visit.  Gastric biopsies negative for H. pylori.  ROS:  Chronic back pain  All other systems are negative except as noted above in the HPI  Past Medical History Past Medical History:  Diagnosis Date   Depression    GERD (gastroesophageal reflux disease)    had duodenal  ulcer 5 months   Post traumatic stress disorder     Past Surgical History Past Surgical History:  Procedure Laterality Date   HERNIA REPAIR     TOTAL HIP ARTHROPLASTY     bilateral    Family History History reviewed. No pertinent family history.  Social History Social History   Socioeconomic History   Marital status: Single    Spouse name: Not on file   Number of children: Not on file   Years of education: Not on file   Highest education level: Not on file  Occupational History   Not on file  Tobacco Use   Smoking status: Never   Smokeless tobacco: Never  Vaping Use   Vaping Use: Never used  Substance and Sexual Activity   Alcohol use: Yes    Alcohol/week: 42.0 standard drinks of alcohol    Types: 42 Cans of beer per week   Drug use: No   Sexual activity: Yes  Other Topics Concern   Not on file  Social History Narrative   Not on file   Social Determinants of Health   Financial Resource Strain: Not on file  Food Insecurity: Not on file  Transportation Needs: Not on file  Physical Activity: Not on file  Stress: Not on file  Social Connections: Not on file    Allergies No Known Allergies  Outpatient Meds Home medications from the H+P and/or nursing med reconciliation reviewed.  Inpatient med list reviewed  _____________________________________________________________________ Objective   Exam:  Current vital signs  Patient Vitals for the past 8 hrs:  BP Temp Temp src Pulse Resp SpO2  02/13/22 0504 115/75 97.9 F (36.6 C) Oral 71 20 98 %  02/13/22 0221 107/65 98 F (36.7 C) Oral 72 16 98 %  02/12/22 2213 123/64 98.1 F (36.7 C) Oral 74 20 98 %    Intake/Output Summary (Last 24 hours) at 02/13/2022 0545 Last data filed at 02/13/2022 0314 Gross per 24 hour  Intake 1600.63 ml  Output 450 ml  Net 1150.63 ml    Physical Exam:   General: this is a pleasant male patient in no acute distress.  His significant other is present for the entire  visit.  His nurse was present for the latter half of the visit. Eyes: sclera anicteric, no redness.  Conjunctiva pale ENT: oral mucosa moist without lesions, no cervical or supraclavicular lymphadenopathy, poor dentition CV: RRR without murmur, S1/S2, no JVD,, no peripheral edema Resp: clear to auscultation bilaterally, normal RR and effort noted GI: soft, no tenderness, with active bowel sounds. No guarding or palpable organomegaly noted Skin; warm and dry, no rash or jaundice noted.  Pale Neuro: awake, alert and oriented x 3. Normal gross motor function and fluent speech. 2 peripheral IVs Labs:     Latest Ref Rng & Units 02/13/2022    3:25 AM 02/12/2022   10:18 PM 02/12/2022    5:53 PM  CBC  WBC 4.0 - 10.5 K/uL 6.5     Hemoglobin 13.0 - 17.0 g/dL 7.4  7.3  7.7   Hematocrit 39.0 - 52.0 % 24.2  23.4  24.7   Platelets 150 - 400 K/uL 359          Latest Ref Rng & Units 02/12/2022   10:31 AM 04/29/2021    3:45 PM 10/21/2020    4:00 PM  CMP  Glucose 70 - 99 mg/dL 103  66  118   BUN 8 - 23 mg/dL 18  19  21   Creatinine 0.61 - 1.24 mg/dL 1.14  1.21  1.04   Sodium 135 - 145 mmol/L 135  141  138   Potassium 3.5 - 5.1 mmol/L 3.5  5.1  3.9   Chloride 98 - 111 mmol/L 106  104  102   CO2 22 - 32 mmol/L 19  21  19   Calcium 8.9 - 10.3 mg/dL 8.5  9.0  9.0   Total Protein 6.5 - 8.1 g/dL 6.7  6.7    Total Bilirubin 0.3 - 1.2 mg/dL 0.3  0.2    Alkaline Phos 38 - 126 U/L 66  148    AST 15 - 41 U/L 26  28    ALT 0 - 44 U/L 13  18      Recent Labs  Lab 02/12/22 1031  INR 1.1    Note type and screen _________________________________________________________ Radiologic studies:   ______________________________________________________ Other studies:   _______________________________________________________ Assessment & Plan  Impression:  Melena Epigastric pain Heartburn Acute blood loss anemia  He may still have ongoing slow GI bleeding, fortunately he is hemodynamically  stable and looks well at the moment.  Hemoglobin dropped since admission, last passage of melena 9 to 10 hours ago.  Unfortunately, this patient continues to take NSAIDs against likely previous advice not to do so given his history of bleeding peptic ulcer disease.  He most likely has bleeding from peptic ulcer disease and perhaps to a lesser degree reflux esophagitis  Plan:  CBC and type and screen now  Upper endoscopy this morning with Dr. Gabriel Mansouraty.  Procedure described along with risks and benefits and   he was agreeable.  The benefits and risks of the planned procedure were described in detail with the patient or (when appropriate) their health care proxy.  Risks were outlined as including, but not limited to, bleeding, infection, perforation, adverse medication reaction leading to cardiac or pulmonary decompensation, pancreatitis (if ERCP).  The limitation of incomplete mucosal visualization was also discussed.  No guarantees or warranties were given. (Patient's nurse was present for entire procedure discussion as well as coordination of care regarding blood work to be drawn now)  Patient at increased risk for cardiopulmonary complications of procedure due to medical comorbidities.  He has adequate peripheral IV access and is currently on a Protonix drip, so if hemoglobin at least 7.0, he is ready for an endoscopy this morning.  He will be evaluated by anesthesia preprocedure (at which time they will also note his poor dentition).  He needs an non-NSAID solution to his chronic back pain.  Thank you for the courtesy of this consult.  Please contact me with any questions or concerns.  Marrisa Kimber L Danis III Office: [phone]  

## 2022-02-13 NOTE — Op Note (Signed)
[facility] Patient Name: Nicholas Yates Procedure Date: 02/13/2022 MRN: [id number] Attending MD: Justice Britain , MD, [id number] Date of Birth: 12-Aug-1955 CSN: [id number] Age: 66 Admit Type: Inpatient Procedure:                Upper GI endoscopy Indications:              Acute post hemorrhagic anemia, Iron deficiency                            anemia secondary to chronic blood loss, Iron                            deficiency anemia, Melena, Occult blood in stool Providers:                Justice Britain, MD, Elmer Ramp. Tilden Dome, RN, Cletis Athens, Merchant [employer] officer Referring MD:             Inpatient medical service, Purcell Nails. Beck Medicines:                Monitored Anesthesia Care Complications:            No immediate complications. Estimated Blood Loss:     Estimated blood loss was minimal. Procedure:                Pre-Anesthesia Assessment:                           - Prior to the procedure, a History and Physical                            was performed, and patient medications and                            allergies were reviewed. The patient's tolerance of                            previous anesthesia was also reviewed. The risks                            and benefits of the procedure and the sedation                            options and risks were discussed with the patient.                            All questions were answered, and informed consent                            was obtained. Prior Anticoagulants: The patient has                            taken no anticoagulant or antiplatelet agents  except for aspirin and has taken no anticoagulant                            or antiplatelet agents except for NSAID medication.                            ASA Grade Assessment: III - A patient with severe                            systemic disease. After reviewing the risks and                             benefits, the patient was deemed in satisfactory                            condition to undergo the procedure.                           After obtaining informed consent, the endoscope was                            passed under direct vision. Throughout the                            procedure, the patient's blood pressure, pulse, and                            oxygen saturations were monitored continuously. The                            [id number]-H190 ([id number]) Olympus endoscope was introduced                            through the mouth, and advanced to the second part                            of duodenum. The upper GI endoscopy was                            accomplished without difficulty. The patient                            tolerated the procedure. Scope In: Scope Out: Findings:      No gross lesions were noted in the entire esophagus.      The Z-line was regular and was found 38 cm from the incisors.      A 1 cm hiatal hernia was present.      One non-bleeding linear gastric ulcer with a clean ulcer base (Forrest       Class III) was found in the gastric antrum. The lesion was 10 mm in       largest dimension.      Patchy mildly erythematous mucosa without bleeding was found in the       entire examined stomach. Biopsies were taken with a cold forceps  for       histology and Helicobacter pylori testing.      One non-bleeding cratered duodenal ulcer with a clean ulcer base       (Forrest Class III) was found in the duodenal sweep. The lesion was 20       mm in largest dimension and encompassed 50% of the wall circumference.       Biopsies were taken with a cold forceps for histology.      A moderate deformity/narrowing was found in the duodenal sweep as a       result of the ulcer (previous EGD had reported duodenal stenosis that       could not be traversed from years ago).      Patchy mildly erythematous mucosa without active bleeding and with no       stigmata of bleeding was  found in the duodenal bulb, in the first       portion of the duodenum and in the second portion of the duodenum.       Biopsies were taken with a cold forceps for histology and HP evaluation.      A medium non-bleeding diverticulum was found in the third portion of the       duodenum. Impression:               - No gross lesions in the entire esophagus. Z-line                            regular, 38 cm from the incisors.                           - 1 cm hiatal hernia.                           - Non-bleeding gastric ulcer with a clean ulcer                            base (Forrest Class III) in the antrum.                           - Erythematous mucosa in the stomach throughout.                            Biopsied.                           - Non-bleeding duodenal ulcer with a clean ulcer                            base (Forrest Class III) in the duodenal sweep.                            This led to a duodenal deformity/narrowing that was                            able to be traversed. The ulcer was biopsied.                           -  Erythematous duodenopathy in the D1/D2 regions.                            Biopsied.                           - Non-bleeding duodenal diverticulum and D3. Moderate Sedation:      Not Applicable - Patient had care per Anesthesia. Recommendation:           - The patient will be observed post-procedure,                            until all discharge criteria are met.                           - Return patient to hospital ward for ongoing care.                           - Advance diet as tolerated.                           - May transition to p.o. PPI 40 mg twice daily.                           - Carafate before every meal plus nightly x1 month                            then twice daily thereafter.                           - Trend hemoglobin/hematocrit every 12 hours.                           - Recommend IV iron x1 dose in-house (will place                             pharmacy consult).                           - May benefit from single unit of packed RBCs but                            will allow medicine service to consider.                           - Unless the patient has transfusion dependent                            anemia develop while hospitalized, would not plan                            inpatient colonoscopy.                           - Most important will be to work with patient and  patient PCP to minimize any nonsteroidal use in                            future as much as possible or at least ensure he                            remains on PPI therapy if low-dose NSAIDs end up                            needing to be a part of his treatment algorithm for                            his chronic back pain.                           - Observe patient's clinical course.                           - Await pathology results.                           - Recommend repeat EGD in 3 to 4 months to ensure                            healing and ensure that the ulcer deformity has not                            caused a restenosis (noted on prior EGD years ago                            at outside facility). Would also recommend patient                            undergo screening colonoscopy because the last time                            he had colonoscopy years ago he had a poor                            preparation. These could be done in conjunction                            together.                           - The findings and recommendations were discussed                            with the patient.                           - The findings and recommendations were discussed  with the referring physician. Procedure Code(s):        --- Professional ---                           [phone], Esophagogastroduodenoscopy, flexible,                            transoral; with  biopsy, single or multiple Diagnosis Code(s):        --- Professional ---                           K44.9, Diaphragmatic hernia without obstruction or                            gangrene                           K25.9, Gastric ulcer, unspecified as acute or                            chronic, without hemorrhage or perforation                           K31.89, Other diseases of stomach and duodenum                           K26.9, Duodenal ulcer, unspecified as acute or                            chronic, without hemorrhage or perforation                           D62, Acute posthemorrhagic anemia                           D50.0, Iron deficiency anemia secondary to blood                            loss (chronic)                           D50.9, Iron deficiency anemia, unspecified                           K92.1, Melena (includes Hematochezia)                           R19.5, Other fecal abnormalities                           K57.10, Diverticulosis of small intestine without                            perforation or abscess without bleeding CPT copyright 2022 [employer]. All rights reserved. The codes documented in this report are preliminary and upon coder review may  be revised to meet current compliance requirements. Justice Britain, MD 02/13/2022 8:11:42 AM Number of Addenda:  0

## 2022-02-13 NOTE — H&P (View-Only) (Signed)
[facility] Consult Note   History Nicholas Yates MRN # [id number]  Date of Admission: 02/12/2022 Date of Consultation: 02/13/2022 Referring physician: Dr. Teddy Spike, DO Primary Care Provider: [name], NP Primary Gastroenterologist: [facility] (none local)   Reason for Consultation/Chief Complaint: Melena with acute blood loss anemia  Subjective  HPI:  66 year old man came to [facility] ED yesterday for progressive dyspnea with epigastric pain and heartburn.  He had been seen at an urgent care for this several days before and established with a new primary care provider on 02/07/2022.  He was treated with Carafate and PPIs with some relief.  He continued to have melena and progressive weakness upon presentation to ED yesterday.  He was found to have a hemoglobin of 8.2 and melena on exam.  He has remained hemodynamically stable since admission and transfer to [facility] overnight. He denies exertional chest pain, but he has become progressively dyspneic and fatigued lately.  He still has some nonspecific epigastric pain and about a week or 2 ago had some nausea and nonbloody vomitus.  Last passage of melena about 8 PM last evening according to the patient's nurse. He takes diclofenac and daily Goody powder for years for chronic back pain.  Records from [facility] clinic found in care everywhere October 2017 indicate patient was anemic at that time with an upper endoscopy showing antral ulcers and a duodenal stricture requiring endoscopic dilation. On 01/08/2016 EGD at that practice revealed active bleeding duodenal ulcer that was injected and clipped.  He had normal colonoscopy the same visit.  Gastric biopsies negative for H. pylori.  ROS:  Chronic back pain  All other systems are negative except as noted above in the HPI  Past Medical History Past Medical History:  Diagnosis Date   Depression    GERD (gastroesophageal reflux disease)    had duodenal  ulcer 5 months   Post traumatic stress disorder     Past Surgical History Past Surgical History:  Procedure Laterality Date   HERNIA REPAIR     TOTAL HIP ARTHROPLASTY     bilateral    Family History History reviewed. No pertinent family history.  Social History Social History   Socioeconomic History   Marital status: Single    Spouse name: Not on file   Number of children: Not on file   Years of education: Not on file   Highest education level: Not on file  Occupational History   Not on file  Tobacco Use   Smoking status: Never   Smokeless tobacco: Never  Vaping Use   Vaping Use: Never used  Substance and Sexual Activity   Alcohol use: Yes    Alcohol/week: 42.0 standard drinks of alcohol    Types: 42 Cans of beer per week   Drug use: No   Sexual activity: Yes  Other Topics Concern   Not on file  Social History Narrative   Not on file   Social Determinants of Health   Financial Resource Strain: Not on file  Food Insecurity: Not on file  Transportation Needs: Not on file  Physical Activity: Not on file  Stress: Not on file  Social Connections: Not on file    Allergies No Known Allergies  Outpatient Meds Home medications from the H+P and/or nursing med reconciliation reviewed.  Inpatient med list reviewed  _____________________________________________________________________ Objective   Exam:  Current vital signs  Patient Vitals for the past 8 hrs:  BP Temp Temp src Pulse Resp SpO2  02/13/22 0504 115/75 97.9 F (36.6 C) Oral 71 20 98 %  02/13/22 0221 107/65 98 F (36.7 C) Oral 72 16 98 %  02/12/22 2213 123/64 98.1 F (36.7 C) Oral 74 20 98 %    Intake/Output Summary (Last 24 hours) at 02/13/2022 0545 Last data filed at 02/13/2022 0314 Gross per 24 hour  Intake 1600.63 ml  Output 450 ml  Net 1150.63 ml    Physical Exam:   General: this is a pleasant male patient in no acute distress.  His significant other is present for the entire  visit.  His nurse was present for the latter half of the visit. Eyes: sclera anicteric, no redness.  Conjunctiva pale ENT: oral mucosa moist without lesions, no cervical or supraclavicular lymphadenopathy, poor dentition CV: RRR without murmur, S1/S2, no JVD,, no peripheral edema Resp: clear to auscultation bilaterally, normal RR and effort noted GI: soft, no tenderness, with active bowel sounds. No guarding or palpable organomegaly noted Skin; warm and dry, no rash or jaundice noted.  Pale Neuro: awake, alert and oriented x 3. Normal gross motor function and fluent speech. 2 peripheral IVs Labs:     Latest Ref Rng & Units 02/13/2022    3:25 AM 02/12/2022   10:18 PM 02/12/2022    5:53 PM  CBC  WBC 4.0 - 10.5 K/uL 6.5     Hemoglobin 13.0 - 17.0 g/dL 7.4  7.3  7.7   Hematocrit 39.0 - 52.0 % 24.2  23.4  24.7   Platelets 150 - 400 K/uL 359          Latest Ref Rng & Units 02/12/2022   10:31 AM 04/29/2021    3:45 PM 10/21/2020    4:00 PM  CMP  Glucose 70 - 99 mg/dL 387  66  564   BUN 8 - 23 mg/dL 18  19  21    Creatinine 0.61 - 1.24 mg/dL  3.32  9.51   Sodium 135 - 145 mmol/L 135  141  138   Potassium 3.5 - 5.1 mmol/L 3.5  5.1  3.9   Chloride 98 - 111 mmol/L 106  104  102   CO2 22 - 32 mmol/L 19  21  19    Calcium 8.9 - 10.3 mg/dL 8.5  9.0  9.0   Total Protein 6.5 - 8.1 g/dL 6.7  6.7    Total Bilirubin 0.3 - 1.2 mg/dL 0.3  0.2    Alkaline Phos 38 - 126 U/L 66  148    AST 15 - 41 U/L 26  28    ALT 0 - 44 U/L 13  18      Recent Labs  Lab 02/12/22 1031  INR 1.1    Note type and screen _________________________________________________________ Radiologic studies:   ______________________________________________________ Other studies:   _______________________________________________________ Assessment & Plan  Impression:  Melena Epigastric pain Heartburn Acute blood loss anemia  He may still have ongoing slow GI bleeding, fortunately he is hemodynamically  stable and looks well at the moment.  Hemoglobin dropped since admission, last passage of melena 9 to 10 hours ago.  Unfortunately, this patient continues to take NSAIDs against likely previous advice not to do so given his history of bleeding peptic ulcer disease.  He most likely has bleeding from peptic ulcer disease and perhaps to a lesser degree reflux esophagitis  Plan:  CBC and type and screen now  Upper endoscopy this morning with Dr. .  Procedure described along with risks and benefits and  he was agreeable.  The benefits and risks of the planned procedure were described in detail with the patient or (when appropriate) their health care proxy.  Risks were outlined as including, but not limited to, bleeding, infection, perforation, adverse medication reaction leading to cardiac or pulmonary decompensation, pancreatitis (if ERCP).  The limitation of incomplete mucosal visualization was also discussed.  No guarantees or warranties were given. (Patient's nurse was present for entire procedure discussion as well as coordination of care regarding blood work to be drawn now)  Patient at increased risk for cardiopulmonary complications of procedure due to medical comorbidities.  He has adequate peripheral IV access and is currently on a Protonix drip, so if hemoglobin at least 7.0, he is ready for an endoscopy this morning.  He will be evaluated by anesthesia preprocedure (at which time they will also note his poor dentition).  He needs an non-NSAID solution to his chronic back pain.  Thank you for the courtesy of this consult.  Please contact me with any questions or concerns.  Charlie Pitter III Office: [phone]

## 2022-02-13 NOTE — Anesthesia Procedure Notes (Signed)
Procedure Name: MAC Date/Time: 02/13/2022 7:34 AM  Performed by: Cynda Familia, CRNAPre-anesthesia Checklist: Patient identified, Emergency Drugs available, Suction available, Patient being monitored and Timeout performed Oxygen Delivery Method: Simple face mask Placement Confirmation: positive ETCO2 and breath sounds checked- equal and bilateral Dental Injury: Teeth and Oropharynx as per pre-operative assessment  Comments: Bite block by Endo tech

## 2022-02-13 NOTE — Progress Notes (Signed)
Pharmacy Consult - IV iron  Assessment: Pharmacy consulted to dose IV iron in this 37 yoM with Hx PUD, presenting with symptomatic anemia and melena. No known allergies or intolerances to IV iron products.  CBC    Component Value Date/Time   WBC 6.2 02/13/2022 0640   RBC 2.49 (L) 02/13/2022 0640   HGB 7.5 (L) 02/13/2022 0640   HGB 11.6 (L) 04/29/2021 1545   HCT 25.0 (L) 02/13/2022 0640   HCT 34.0 (L) 04/29/2021 1545   PLT 371 02/13/2022 0640   PLT 459 (H) 04/29/2021 1545   MCV 100.4 (H) 02/13/2022 0640   MCV 92 04/29/2021 1545   MCH 30.1 02/13/2022 0640   MCHC 30.0 02/13/2022 0640   RDW 20.1 (H) 02/13/2022 0640   RDW 13.0 04/29/2021 1545   LYMPHSABS 2.3 02/13/2022 0325   LYMPHSABS 1.6 04/29/2021 1545   MONOABS 0.6 02/13/2022 0325   EOSABS 0.0 02/13/2022 0325   EOSABS 0.2 04/29/2021 1545   BASOSABS 0.0 02/13/2022 0325   BASOSABS 0.1 04/29/2021 1545   Plan: Although acute blood loss requires less iron replenishment compared to iron deficiency, suspect there may be a chronic component to GIB which makes calculation of iron deficit challenging. Will dose empirically. Ferric gluconate 250 mg IV daily x 2 Recheck CBC in 4 wks; redosing iron as appropriate Pharmacy will sign off  Bernadene Person, PharmD, BCPS [phone] 02/13/2022, 9:10 AM

## 2022-02-14 ENCOUNTER — Encounter (HOSPITAL_COMMUNITY): Payer: Self-pay | Admitting: Gastroenterology

## 2022-02-14 DIAGNOSIS — F411 Generalized anxiety disorder: Secondary | ICD-10-CM

## 2022-02-14 DIAGNOSIS — K259 Gastric ulcer, unspecified as acute or chronic, without hemorrhage or perforation: Secondary | ICD-10-CM | POA: Diagnosis not present

## 2022-02-14 DIAGNOSIS — G8929 Other chronic pain: Secondary | ICD-10-CM | POA: Diagnosis not present

## 2022-02-14 DIAGNOSIS — K921 Melena: Secondary | ICD-10-CM | POA: Diagnosis not present

## 2022-02-14 DIAGNOSIS — D649 Anemia, unspecified: Secondary | ICD-10-CM | POA: Diagnosis not present

## 2022-02-14 DIAGNOSIS — K269 Duodenal ulcer, unspecified as acute or chronic, without hemorrhage or perforation: Secondary | ICD-10-CM | POA: Diagnosis not present

## 2022-02-14 DIAGNOSIS — E785 Hyperlipidemia, unspecified: Secondary | ICD-10-CM | POA: Diagnosis not present

## 2022-02-14 LAB — CBC
HCT: 25.5 % — ABNORMAL LOW (ref 39.0–52.0)
Hemoglobin: 7.7 g/dL — ABNORMAL LOW (ref 13.0–17.0)
MCH: 30.3 pg (ref 26.0–34.0)
MCHC: 30.2 g/dL (ref 30.0–36.0)
MCV: 100.4 fL — ABNORMAL HIGH (ref 80.0–100.0)
Platelets: 402 10*3/uL — ABNORMAL HIGH (ref 150–400)
RBC: 2.54 MIL/uL — ABNORMAL LOW (ref 4.22–5.81)
RDW: 19.4 % — ABNORMAL HIGH (ref 11.5–15.5)
WBC: 7.4 10*3/uL (ref 4.0–10.5)
nRBC: 0 % (ref 0.0–0.2)

## 2022-02-14 MED ORDER — ACETAMINOPHEN 500 MG PO TABS: 500.0000 mg | ORAL_TABLET | Freq: Three times a day (TID) | ORAL | Status: DC | PRN

## 2022-02-14 MED ORDER — SUCRALFATE 1 G PO TABS: ORAL_TABLET | ORAL | 0 refills | Status: DC

## 2022-02-14 MED ORDER — OXYCODONE HCL 5 MG PO TABS: 5.0000 mg | ORAL_TABLET | Freq: Four times a day (QID) | ORAL | 0 refills | Status: DC | PRN

## 2022-02-14 MED ORDER — PANTOPRAZOLE SODIUM 40 MG PO TBEC: 40.0000 mg | DELAYED_RELEASE_TABLET | Freq: Two times a day (BID) | ORAL | 2 refills | Status: DC

## 2022-02-14 NOTE — TOC Transition Note (Signed)
Transition of Care [facility]) - CM/SW Discharge Note   Patient Details  Name: Nicholas Yates MRN: [id number] Date of Birth: 08-Aug-1955  Transition of [facility]) CM/SW Contact:  Golda Acre, RN Phone Number: 02/14/2022, 11:20 AM   Clinical Narrative:    [id number]/patient discharged to return home.  Chart reviewed for TOC needs.  None found.  Patient self care.   Final next level of care: Home/Self Care Barriers to Discharge: Barriers Resolved   Patient Goals and CMS Choice Patient states their goals for this hospitalization and ongoing recovery are:: to return home CMS Medicare.gov Compare Post Acute Care list provided to:: Patient    Discharge Placement                       Discharge Plan and Services   Discharge Planning Services: CM Consult                                 Social Determinants of Health (SDOH) Interventions     Readmission Risk Interventions   No data to display

## 2022-02-14 NOTE — Evaluation (Signed)
Physical Therapy One Time Evaluation Patient Details Name: Nicholas Yates MRN: [id number] DOB: Sep 02, 1955 Today's Date: 02/14/2022  History of Present Illness  66 y.o. male with a history of PTSD, anxiety, depression, GERD, PUD, hyperlipidemia, vertebral compression fracture s/p vertebroplasty.  Patient presented on 02/12/22 secondary to chest/epigastric pain in addition to dyspnea on exertion/fatigue.  History complicated by the development of melanotic stools concerning for acute GI bleeding.  Clinical Impression  Patient evaluated by Physical Therapy with no further acute PT needs identified. All education has been completed and the patient has no further questions.   Pt has been mobilizing independently.  PT ordered to address back pain.  Discussed back history and current issues.   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  Pt also has history of vertebroplasty this past February.  Pt has been taking pain meds due to back pain from surgical hx and repetitive stress.  Observed pt's lifting technique and he is unable to flex knees well to protect back posture.  Pt encouraged to f/u with outpatient PT however pt states he is always working.  Pt also encouraged to discuss pain, limitations, medical needs/accommodations with his work.  See below for any follow-up Physical Therapy or equipment needs. PT is signing off. Thank you for this referral.        Recommendations for follow up therapy are one component of a multi-disciplinary discharge planning process, led by the attending physician.  Recommendations may be updated based on patient status, additional functional criteria and insurance authorization.  Follow Up Recommendations Outpatient PT      Assistance Recommended at Discharge    Patient can return home with the following       Equipment Recommendations None recommended by PT   Recommendations for Other Services       Functional Status Assessment Patient has not had a recent decline in their functional status     Precautions / Restrictions Precautions Precautions: None      Mobility  Bed Mobility Overal bed mobility: Modified Independent                  Transfers Overall transfer level: Modified independent                      Ambulation/Gait Ambulation/Gait assistance: Modified independent (Device/Increase time)             General Gait Details: just near bed  Stairs            Wheelchair Mobility    Modified Rankin (Stroke Patients Only)       Balance                                             Pertinent Vitals/Pain Pain Assessment Pain Assessment: No/denies pain    Home Living Family/patient expects to be discharged to:: Private residence Living Arrangements: Spouse/significant other                      Prior Function Prior Level of Function : Independent/Modified Independent                     Hand Dominance        Extremity/Trunk Assessment   Upper Extremity Assessment  Upper Extremity Assessment: Overall WFL for tasks assessed    Lower Extremity Assessment Lower Extremity Assessment: Overall WFL for tasks assessed    Cervical / Trunk Assessment Cervical / Trunk Assessment: Back Surgery  Communication   Communication: No difficulties  Cognition Arousal/Alertness: Awake/alert Behavior During Therapy: WFL for tasks assessed/performed Overall Cognitive Status: Within Functional Limits for tasks assessed                                          General Comments      Exercises     Assessment/Plan    PT Assessment All further PT needs can be met in the next venue of care  PT Problem List Decreased knowledge of precautions;Pain       PT Treatment Interventions      PT Goals (Current goals can be found in the Care Plan  section)  Acute Rehab PT Goals PT Goal Formulation: All assessment and education complete, DC therapy    Frequency       Co-evaluation               AM-PAC PT "6 Clicks" Mobility  Outcome Measure Help needed turning from your back to your side while in a flat bed without using bedrails?: None Help needed moving from lying on your back to sitting on the side of a flat bed without using bedrails?: None Help needed moving to and from a bed to a chair (including a wheelchair)?: None Help needed standing up from a chair using your arms (e.g., wheelchair or bedside chair)?: None Help needed to walk in hospital room?: None Help needed climbing 3-5 steps with a railing? : None 6 Click Score: 24    End of Session   Activity Tolerance: Patient tolerated treatment well Patient left: in bed;with call bell/phone within reach;with family/visitor present   PT Visit Diagnosis: Pain Pain - part of body:  (back)    Time: 1048-1100 PT Time Calculation (min) (ACUTE ONLY): 12 min   Charges:   PT Evaluation $PT Eval Low Complexity: 1 Low         Kati PT, DPT Physical Therapist Acute Rehabilitation Services Preferred contact method: Secure Chat Weekend Pager Only: [phone] Office: [place] 02/14/2022, 12:56 PM

## 2022-02-14 NOTE — TOC Initial Note (Signed)
Transition of Care [facility]) - Initial/Assessment Note    Patient Details  Name: Nicholas Yates MRN: [id number] Date of Birth: 1955/12/09  [facility]) CM/SW Contact:    Golda Acre, RN Phone Number: 02/14/2022, 9:07 AM  Clinical Narrative:                  Transition of Care [facility]) Screening Note   Patient Details  Name: Nicholas Yates Date of Birth: 11/11/1955   Transition of Care [facility]) CM/SW Contact:    Golda Acre, RN Phone Number: 02/14/2022, 9:07 AM    Transition of Care Department [facility]) has reviewed patient and no TOC needs have been identified at this time. We will continue to monitor patient advancement through interdisciplinary progression rounds. If new patient transition needs arise, please place a TOC consult.    Expected Discharge Plan: Home/Self Care Barriers to Discharge: Continued Medical Work up   Patient Goals and CMS Choice Patient states their goals for this hospitalization and ongoing recovery are:: to return home CMS Medicare.gov Compare Post Acute Care list provided to:: Patient    Expected Discharge Plan and Services Expected Discharge Plan: Home/Self Care   Discharge Planning Services: CM Consult   Living arrangements for the past 2 months: Single Family Home                                      Prior Living Arrangements/Services Living arrangements for the past 2 months: Single Family Home Lives with:: Self Patient language and need for interpreter reviewed:: Yes Do you feel safe going back to the place where you live?: Yes            Criminal Activity/Legal Involvement Pertinent to Current Situation/Hospitalization: No - Comment as needed  Activities of Daily Living      Permission Sought/Granted                  Emotional Assessment Appearance:: Appears stated age Attitude/Demeanor/Rapport: Engaged Affect (typically observed): Calm Orientation: : Oriented to Self, Oriented to Place,  Oriented to  Time, Oriented to Situation Alcohol / Substance Use: Not Applicable Psych Involvement: No (comment)  Admission diagnosis:  Acute upper GI bleeding [K92.2] GIB (gastrointestinal bleeding) [K92.2] Alcohol use [Z78.9] Patient Active Problem List   Diagnosis Date Noted   GIB (gastrointestinal bleeding) 02/12/2022   ETOH abuse 02/12/2022   Symptomatic anemia 02/12/2022   Chronic pain 02/12/2022   Dyslipidemia 04/29/2021   Extrapyramidal symptom 10/04/2018   Bipolar 2 disorder (HCC) 08/31/2018   ED (erectile dysfunction) 08/31/2018   Generalized anxiety disorder 01/20/2017   Depression, recurrent (HCC) 07/07/2016   PTSD (post-traumatic stress disorder) 07/07/2016   Duodenal ulcer 07/07/2016   PCP:  [name], NP Pharmacy:   [id number] - [place], [place] - [address] [place] [address] Phone: [phone] Fax: [phone]     Social Determinants of Health (SDOH) Interventions    Readmission Risk Interventions   No data to display

## 2022-02-14 NOTE — Discharge Instructions (Signed)
Nicholas Yates,  You were in the hospital with bleeding from your GI bleeding. This appears to be related to ulcers likely caused by NSAID use. Please stop use of all NSAIDs (Goody powder, ibuprofen, Advil, Naproxen, Aleve, etc). Please follow-up with your PCP and the GI physician. Your GI physician will follow-up with you regarding biopsy and repeat endoscopy.

## 2022-02-14 NOTE — Progress Notes (Incomplete)
Patient ID: Nicholas Yates, male   DOB: 03/07/1956, 66 y.o.   MRN: [id number]    Progress Note   Subjective   Day # 3  CC; GI bleed, anemia melena  EGD yesterday-1 cm hiatal hernia, 1 nonbleeding linear gastric ulcer with clean base 10 mm in dimension mild patchy gastritis in the entire stomach biopsies pending, 1 nonbleeding cratered duodenal ulcer with clean base measuring 20 mm, moderate deformity/narrowing in the duodenal sweep as a result of the ulcer  Biopsies pending Labs today-hemoglobin 7.7/hematocrit 25.5 stable    Objective   Vital signs in last 24 hours: Temp:  [97.6 F (36.4 C)-98.7 F (37.1 C)] 97.8 F (36.6 C) (11/27 0400) Pulse Rate:  [74-89] 77 (11/27 0956) Resp:  [18-20] 18 (11/27 0956) BP: (112-142)/(61-90) 132/72 (11/27 0956) SpO2:  [97 %-100 %] 98 % (11/27 0956) Weight:  [82.5 kg] 82.5 kg (11/27 0400) Last BM Date : 02/12/22 General:    white male in NAD Heart:  Regular rate and rhythm; no murmurs Lungs: Respirations even and unlabored, lungs CTA bilaterally Abdomen:  Soft, nontender and nondistended. Normal bowel sounds. Extremities:  Without edema. Neurologic:  Alert and oriented,  grossly normal neurologically. Psych:  Cooperative. Normal mood and affect.  Intake/Output from previous day: 11/26 0701 - 11/27 0700 In: 300 [I.V.:300] Out: 2 [Urine:2] Intake/Output this shift: No intake/output data recorded.  Lab Results: Recent Labs    02/13/22 0325 02/13/22 0640 02/13/22 1639 02/14/22 0321  WBC 6.5 6.2  --  7.4  HGB 7.4* 7.5* 7.7* 7.7*  HCT 24.2* 25.0* 25.0* 25.5*  PLT 359 371  --  402*   BMET Recent Labs    02/12/22 1031 02/13/22 0325  NA 135 140  K 3.5 3.7  CL 106 111  CO2 19* 23  GLUCOSE 103* 91  BUN 18 15  CREATININE 1.14 0.95  CALCIUM 8.5* 8.4*   LFT Recent Labs    02/12/22 1031 02/13/22 0325  PROT 6.7 6.1*  ALBUMIN 3.3* 3.2*  AST 26 17  ALT 13 10  ALKPHOS 66 54  BILITOT 0.3 0.5  BILIDIR <0.1  --   IBILI NOT  CALCULATED  --    PT/INR Recent Labs    02/12/22 1031  LABPROT 14.0  INR 1.1        Assessment / Plan:         Principal Problem:   GIB (gastrointestinal bleeding) Active Problems:   Depression, recurrent (HCC)   PTSD (post-traumatic stress disorder)   Generalized anxiety disorder   Dyslipidemia   ETOH abuse   Symptomatic anemia   Chronic pain     LOS: 0 days   Kimra Kantor PA-C 02/14/2022, 11:04 AM

## 2022-02-14 NOTE — Progress Notes (Signed)
Discharge instructions were reviewed. The patient is stable, no change from am assessment.Patient is alert, oriented x4, ambulatory without assistance.  Questions concerns addressed concerning pain management. Pt encouraged to follow up with PCP to continue plan of care.

## 2022-02-14 NOTE — Discharge Summary (Signed)
Physician Discharge Summary   Patient: Nicholas Yates MRN: [id number] DOB: 1955/11/10  Admit date:     02/12/2022  Discharge date: 02/14/22  Discharge Physician: Jacquelin Hawking, MD   PCP: [name], NP   Recommendations at discharge:  Stop Eliquis No NSAIDs GI follow-up Follow-up biopsy results  Discharge Diagnoses: Principal Problem:   GIB (gastrointestinal bleeding) Active Problems:   Depression, recurrent (HCC)   PTSD (post-traumatic stress disorder)   Duodenal ulcer   Generalized anxiety disorder   Dyslipidemia   ETOH abuse   Symptomatic anemia   Chronic pain   Gastric ulcer  Resolved Problems:   * No resolved hospital problems. *  Hospital Course: Nicholas Yates is a 66 y.o. male with a history of PTSD, anxiety, depression, GERD, PUD, hyperlipidemia.  Patient presented secondary to chest/epigastric pain in addition to dyspnea on exertion/fatigue.  History complicated by the development of melanotic stools concerning for acute GI bleeding.  Fecal occult blood testing was positive.  [facility] was consulted and performed upper endoscopy on 11/26 which was significant for nonbleeding gastric and duodenal ulcers; biopsy obtained.  Recommendation for PPI and Carafate on discharge.  Recommendation for repeat EGD in 3 to 4 months in addition to screening colonoscopy.  Assessment and Plan:  Melena Normal BUN.  Concerning for upper GI bleeding especially in setting of PUD and NSAID use history.  [facility] consulted and performed upper endoscopy which was significant for nonbleeding gastric and duodenal ulcers. GI recommendations included no NSAID use, Protonix 40 mg twice daily, Carafate 3 times daily before every meal and nightly for 1 month followed by twice daily dosing, repeat EGD in 3 to 4 months in addition to screening colonoscopy.   Epigastric pain Likely secondary to ulcerative disease.  Pain currently resolved.   Peptic ulcer disease Known history.  Complicated by  patient's continued use of NSAIDs.  Medication recommendations as mentioned above.   Symptomatic anemia Acute blood loss anemia Secondary to GI bleed. Hemoglobin off 8.2 on admission, down to a nadir of 7.3 and is stable at 7.7. Patient received IV iron prior to discharge.   Chronic back pain Noted. Patient has a history of vertebral compression fracture and is s/p vertebroplasty. Patient continues to use NSAIDs as an outpatient for management of pain. Also complicated by constant lifting/bending at his job. Patient will need long term analgesic regimen that does not include NSAIDs. Patient to follow-up with PCP.   Hyperlipidemia Continue Crestor   Alcohol abuse Noted.  No alcohol withdrawal symptoms.  Patient started on CIWA on admission without issue.   Anxiety Depression Continue Effexor XR   Consultants: [facility] Procedures performed: Upper endoscopy  Disposition: Home Diet recommendation: Regular diet   DISCHARGE MEDICATION: Allergies as of 02/14/2022   No Known Allergies      Medication List     STOP taking these medications    diclofenac 75 MG EC tablet Commonly known as: VOLTAREN   lansoprazole 30 MG capsule Commonly known as: Prevacid   OVER THE COUNTER MEDICATION   predniSONE 20 MG tablet Commonly known as: DELTASONE   sucralfate 1 GM/10ML suspension Commonly known as: CARAFATE Replaced by: sucralfate 1 g tablet       TAKE these medications    acetaminophen 500 MG tablet Commonly known as: TYLENOL Take 1-2 tablets (500-1,000 mg total) by mouth every 8 (eight) hours as needed. What changed:  how much to take when to take this reasons to take this   cyclobenzaprine 5  MG tablet Commonly known as: FLEXERIL Take 1 tablet (5 mg total) by mouth 3 (three) times daily as needed for muscle spasms.   diclofenac Sodium 1 % Gel Commonly known as: VOLTAREN Apply 4 g topically 4 (four) times daily. What changed:  how much to  take when to take this reasons to take this   oxyCODONE 5 MG immediate release tablet Commonly known as: Oxy IR/ROXICODONE Take 1 tablet (5 mg total) by mouth every 6 (six) hours as needed for up to 3 days for severe pain.   pantoprazole 40 MG tablet Commonly known as: PROTONIX Take 1 tablet (40 mg total) by mouth 2 (two) times daily before a meal.   rosuvastatin 10 MG tablet Commonly known as: Crestor Take 1 tablet (10 mg total) by mouth daily.   sucralfate 1 g tablet Commonly known as: CARAFATE Take 1 tablet (1 g total) by mouth 4 (four) times daily -  with meals and at bedtime for 30 days, THEN 1 tablet (1 g total) 2 (two) times daily. Start taking on: February 14, 2022 Replaces: sucralfate 1 GM/10ML suspension   tadalafil 10 MG tablet Commonly known as: CIALIS Take 1 tablet (10 mg total) by mouth daily as needed for erectile dysfunction. What changed: how much to take   venlafaxine XR 75 MG 24 hr capsule Commonly known as: EFFEXOR-XR Take 3 capsules (225 mg total) by mouth daily with breakfast.        Follow-up Information     [name], NP. Schedule an appointment as soon as possible for a visit in 1 week(s).   Specialties: Family Medicine, Emergency Medicine Why: For hospital follow-up Contact information: [address] [place] [address] [phone]         Sherrilyn Rist, MD. Schedule an appointment as soon as possible for a visit in 4 week(s).   Specialty: Gastroenterology Why: For hospital follow-up Contact information: [address] [place] [address] [phone]                Discharge Exam: BP 128/75 (BP Location: Right Arm)   Pulse 69   Temp 98.7 F (37.1 C) (Oral)   Resp 18   Ht 6' (1.829 m)   Wt 82.5 kg   SpO2 100%   BMI 24.67 kg/m   General exam: Appears calm and comfortable Respiratory system: Clear to auscultation. Respiratory effort normal. Cardiovascular system: S1 & S2  heard, RRR. Gastrointestinal system: Abdomen is nondistended, soft and nontender. Normal bowel sounds heard. Central nervous system: Alert and oriented. No focal neurological deficits. Musculoskeletal: No edema. No calf tenderness Skin: No cyanosis. No rashes Psychiatry: Judgement and insight appear normal. Mood & affect appropriate.    Condition at discharge: stable  The results of significant diagnostics from this hospitalization (including imaging, microbiology, ancillary and laboratory) are listed below for reference.   Imaging Studies: DG Chest 2 View  Result Date: 02/12/2022 CLINICAL DATA:  Chest pain EXAM: CHEST - 2 VIEW COMPARISON:  04/27/2021 FINDINGS: The heart size and mediastinal contours are within normal limits. Medial consolidation or volume loss left base is stable finding. This could also represent scarring. Lungs are otherwise clear. Normal pulmonary vasculature. No pneumothorax or pleural effusion. IMPRESSION: Stable scarring or consolidation medial left base. Otherwise no acute cardiopulmonary process. Electronically Signed   By: Layla Maw M.D.   On: 02/12/2022 10:20    Microbiology: Results for orders placed or performed in visit on  09/25/20  Novel Coronavirus, NAA ([facility])     Status: Abnormal   Collection Time: 09/25/20  3:19 PM   Specimen: Nasopharyngeal(NP) swabs in vial transport medium  Result Value Ref Range Status   SARS-CoV-2, NAA Detected (A) Not Detected Final    Comment: Patients who have a positive COVID-19 test result may now have treatment options. Treatment options are available for patients with mild to moderate symptoms and for hospitalized patients. Visit our website at [email or web address] for resources and information. This nucleic acid amplification test was developed and its performance characteristics determined by [employer]. Nucleic acid amplification tests include RT-PCR and TMA. This test has not been FDA  cleared or approved. This test has been authorized by FDA under an Emergency Use Authorization (EUA). This test is only authorized for the duration of time the declaration that circumstances exist justifying the authorization of the emergency use of in vitro diagnostic tests for detection of SARS-CoV-2 virus and/or diagnosis of COVID-19 infection under section 564(b)(1) of the Act, 21 U.S.C. 846NGE-9(B) (1), unless the authorization is terminated or revoked sooner. When diagnostic testing is negativ e, the possibility of a false negative result should be considered in the context of a patient's recent exposures and the presence of clinical signs and symptoms consistent with COVID-19. An individual without symptoms of COVID-19 and who is not shedding SARS-CoV-2 virus would expect to have a negative (not detected) result in this assay.   SARS-COV-2, NAA 2 DAY TAT     Status: None   Collection Time: 09/25/20  3:19 PM  Result Value Ref Range Status   SARS-CoV-2, NAA 2 DAY TAT Performed  Final    Labs: CBC: Recent Labs  Lab 02/12/22 1031 02/12/22 1753 02/12/22 2218 02/13/22 0325 02/13/22 0640 02/13/22 1639 02/14/22 0321  WBC 10.8*  --   --  6.5 6.2  --  7.4  NEUTROABS  --   --   --  3.6  --   --   --   HGB 8.2*   < > 7.3* 7.4* 7.5* 7.7* 7.7*  HCT 25.7*   < > 23.4* 24.2* 25.0* 25.0* 25.5*  MCV 97.0  --   --  99.2 100.4*  --  100.4*  PLT 461*  --   --  359 371  --  402*   < > = values in this interval not displayed.   Basic Metabolic Panel: Recent Labs  Lab 02/12/22 1031 02/13/22 0325  NA 135 140  K 3.5 3.7  CL 106 111  CO2 19* 23  GLUCOSE 103* 91  BUN 18 15  CREATININE 1.14 0.95  CALCIUM 8.5* 8.4*   Liver Function Tests: Recent Labs  Lab 02/12/22 1031 02/13/22 0325  AST 26 17  ALT 13 10  ALKPHOS 66 54  BILITOT 0.3 0.5  PROT 6.7 6.1*  ALBUMIN 3.3* 3.2*    Discharge time spent: 35 minutes.  Signed: Jacquelin Hawking, MD [facility] 02/14/2022

## 2022-02-15 ENCOUNTER — Telehealth: Payer: Self-pay

## 2022-02-15 LAB — SURGICAL PATHOLOGY

## 2022-02-15 NOTE — Telephone Encounter (Signed)
-----   Message from Nicholas Cooper, PA-C sent at 02/14/2022  1:46 PM EST ----- Regarding: labs, and office folow up Nicholas Yates, this patient was discharged from hospital today, had been admitted with GI bleed, duodenal ulcer  Please place orders for him to come on Monday, 02/21/2022 for CBC follow-up anemia-labs can be under my name Also please send a long-term prescription to his pharmacy for Protonix 40 mg p.o. twice daily #60 and 5 refills He needs office follow-up with Dr. Meridee Score  if possible, 5 to 6 weeks is okay thanks-he was already discharged so perhaps if you could call him tomorrow with the follow-up appointment of the great thank you

## 2022-02-16 ENCOUNTER — Other Ambulatory Visit: Payer: Self-pay

## 2022-02-16 ENCOUNTER — Encounter: Payer: Self-pay | Admitting: Gastroenterology

## 2022-02-16 ENCOUNTER — Encounter: Payer: Self-pay | Admitting: Family Medicine

## 2022-02-16 ENCOUNTER — Telehealth: Payer: Self-pay | Admitting: Family Medicine

## 2022-02-16 ENCOUNTER — Ambulatory Visit (INDEPENDENT_AMBULATORY_CARE_PROVIDER_SITE_OTHER): Payer: Medicare Other | Admitting: Family Medicine

## 2022-02-16 VITALS — BP 114/68 | HR 88 | Temp 98.3°F | Resp 16 | Ht 72.0 in | Wt 184.2 lb

## 2022-02-16 DIAGNOSIS — M545 Low back pain, unspecified: Secondary | ICD-10-CM | POA: Diagnosis not present

## 2022-02-16 DIAGNOSIS — D649 Anemia, unspecified: Secondary | ICD-10-CM | POA: Diagnosis not present

## 2022-02-16 DIAGNOSIS — G8929 Other chronic pain: Secondary | ICD-10-CM

## 2022-02-16 DIAGNOSIS — K2971 Gastritis, unspecified, with bleeding: Secondary | ICD-10-CM

## 2022-02-16 LAB — CBC
HCT: 25.6 % — ABNORMAL LOW (ref 39.0–52.0)
Hemoglobin: 8.4 g/dL — ABNORMAL LOW (ref 13.0–17.0)
MCHC: 32.8 g/dL (ref 30.0–36.0)
MCV: 95.7 fl (ref 78.0–100.0)
Platelets: 536 10*3/uL — ABNORMAL HIGH (ref 150.0–400.0)
RBC: 2.68 Mil/uL — ABNORMAL LOW (ref 4.22–5.81)
RDW: 20.5 % — ABNORMAL HIGH (ref 11.5–15.5)
WBC: 7.5 10*3/uL (ref 4.0–10.5)

## 2022-02-16 LAB — IBC PANEL
Iron: 50 ug/dL (ref 42–165)
Saturation Ratios: 12.6 % — ABNORMAL LOW (ref 20.0–50.0)
TIBC: 397.6 ug/dL (ref 250.0–450.0)
Transferrin: 284 mg/dL (ref 212.0–360.0)

## 2022-02-16 LAB — FERRITIN: Ferritin: 105 ng/mL (ref 22.0–322.0)

## 2022-02-16 MED ORDER — OXYCODONE-ACETAMINOPHEN 5-325 MG PO TABS: 1.0000 | ORAL_TABLET | Freq: Three times a day (TID) | ORAL | 0 refills | Status: AC | PRN

## 2022-02-16 NOTE — Telephone Encounter (Signed)
Spoke with pt and wanted to know why the pharmacy would not fill his prescription, he started he had one oxy left. Called pharmacy and spoke with pharmacist he made me aware that pt told him that pt told them that he didn't have any medication left. Also told me that they are able to fill it tomorrow because he should have enough for 3 days.  I called pt to make him aware.

## 2022-02-16 NOTE — Progress Notes (Signed)
Established Patient Office Visit  Subjective   Patient ID: Nicholas Yates, male    DOB: 1955-07-28  Age: 66 y.o. MRN: [id number]  Chief Complaint  Patient presents with   Hospitalization Follow-up    Pt is here today for hospital f/u    HPI  Follow up Hospitalization  Patient was admitted to [facility] on 02/12/22 and discharged on 02/14/22. He was treated for GI bleed (Hgb 7.3). Treatment for this included: IV iron, Stop Eliquis; avoid NSAIDs; take Protonix BID and Carafate ACHS x1 month then BID He reports good compliance with treatment. He reports this condition is improved.  EGD during admission revealed non-bleeding gastric and duodenal ulcers (biopsies obtained). He is scheduled to repeat labs at GI office next week and follow-up with GI MD in 5 weeks. They have started him on Protonix 40 mg BID and Carafate TID. Recommending repeat EGD and screening colonoscopy in 3-4 months.   Today he reports he is feeling a little better. He had a mostly normal BM today, no signs of bleeding. He has been taking his medications as prescribed. He would like his Hgb rechecked today. His biggest concern today is his back pain still bothering him now that he cannot take NSAIDs. At discharge he was given 3 days of oxy which has helped, but he is almost out and wants more. Reports he has tried tramadol in the past with no relief. He is planning to schedule with PT. No other new symptoms.       ROS All review of systems negative except what is listed in the HPI    Objective:     BP 114/68   Pulse 88   Temp 98.3 F (36.8 C)   Resp 16   Ht 6' (1.829 m)   Wt 184 lb 3.2 oz (83.6 kg)   SpO2 100%   BMI 24.98 kg/m    Physical Exam Vitals reviewed.  Constitutional:      General: He is not in acute distress.    Appearance: Normal appearance. He is not ill-appearing.  Cardiovascular:     Rate and Rhythm: Normal rate and regular rhythm.  Pulmonary:     Effort: Pulmonary effort is  normal.     Breath sounds: Normal breath sounds.  Skin:    General: Skin is warm and dry.  Neurological:     General: No focal deficit present.     Mental Status: He is alert and oriented to person, place, and time. Mental status is at baseline.  Psychiatric:        Mood and Affect: Mood normal.        Behavior: Behavior normal.        Thought Content: Thought content normal.        Judgment: Judgment normal.      No results found for any visits on 02/16/22.    The 10-year ASCVD risk score (Arnett DK, et al., 2019) is: 9.3%    Assessment & Plan:   Problem List Items Addressed This Visit   None Visit Diagnoses     Chronic bilateral low back pain without sciatica    -  Primary Adding a few more days of pain medication, but I cannot prescribe this long-term.  Please reschedule with physical therapy Placing ortho referral to further address your back pain management.    Relevant Medications   oxyCODONE-acetaminophen (PERCOCET/ROXICET) 5-325 MG tablet   Other Relevant Orders   Ambulatory referral to Orthopedic Surgery   Anemia, unspecified  type     Rechecking your anemia labs today to ensure improvement.  Continue your discharge medications as prescribed Monitor for any signs of bleeding Keep upcoming GI follow-up appointments    Relevant Orders   CBC   Ferritin   IBC panel       Return in about 6 weeks (around 03/30/2022) for follow-up, anemia.    [name], NP

## 2022-02-16 NOTE — Telephone Encounter (Signed)
Pharmacist at [facility] called to advise that patient was given oxycodone and still has some left and he wants to discuss the prescription sent in for percocet by [name]. Patient was waiting at pharmacy. Please call him to discuss medication concern. #[phone].

## 2022-02-16 NOTE — Telephone Encounter (Signed)
Patient was prescribed Protonix at discharge with refills.  Follow up scheduled. Letter mailed to the patient.

## 2022-02-16 NOTE — Patient Instructions (Addendum)
Rechecking your anemia labs today to ensure improvement.  Continue your discharge medications as prescribed Monitor for any signs of bleeding Keep upcoming GI follow-up appointments   Adding a few more days of pain medication, but I cannot prescribe this long-term.  Please reschedule with physical therapy Placing ortho referral to further address your back pain management.

## 2022-02-25 ENCOUNTER — Telehealth: Payer: Self-pay | Admitting: Family Medicine

## 2022-02-25 NOTE — Telephone Encounter (Signed)
Herbert Seta stated she has been in our office with the pt a couple times and wanted to know if he was needing labs or an OV. She is worried because she thinks his GI bleed is coming back. Advised she is not on DPR but if he has bleeding again he may need to got to the hospital again. Transferred to triage.

## 2022-03-16 ENCOUNTER — Other Ambulatory Visit: Payer: Self-pay | Admitting: Family Medicine

## 2022-03-16 DIAGNOSIS — N529 Male erectile dysfunction, unspecified: Secondary | ICD-10-CM

## 2022-03-17 ENCOUNTER — Encounter: Payer: Self-pay | Admitting: Family Medicine

## 2022-03-17 ENCOUNTER — Ambulatory Visit (INDEPENDENT_AMBULATORY_CARE_PROVIDER_SITE_OTHER): Payer: Medicare Other | Admitting: Family Medicine

## 2022-03-17 VITALS — BP 134/71 | HR 75 | Temp 97.8°F | Resp 16 | Ht 72.0 in | Wt 184.8 lb

## 2022-03-17 DIAGNOSIS — M545 Low back pain, unspecified: Secondary | ICD-10-CM | POA: Diagnosis not present

## 2022-03-17 DIAGNOSIS — D649 Anemia, unspecified: Secondary | ICD-10-CM

## 2022-03-17 DIAGNOSIS — G8929 Other chronic pain: Secondary | ICD-10-CM

## 2022-03-17 MED ORDER — LIDOCAINE 4 % EX PTCH: 1.0000 | MEDICATED_PATCH | CUTANEOUS | 1 refills | Status: DC

## 2022-03-17 NOTE — Progress Notes (Signed)
Acute Office Visit  Subjective:     Patient ID: Nicholas Yates, male    DOB: 1955-09-10, 66 y.o.   MRN: [id number]  Chief Complaint  Patient presents with   Back Pain     Patient is in today for chronic back pain.   Patient reports that he continues to have issues with his low back pain. States that the pain meds he got last time helped, but he is out and needs more. At last visit I advised him that I do not start chronic pain management. He failed to follow-up with ortho and PT referrals I placed at last visit. States he has had to miss a few days of work because of the severe pain. He reports he has tried topical creams without any improvement and he didn't get any relief from the Flexeril previously prescribed. He denies any numbness, tingling, radiation of pain, incontinence.   PDMP review: 06/14/21 Norco (56 tabs) 07/02/21 Norco (56 tabs) 02/14/22 Oxy 5 mg (12 tabs) 02/17/22 Percocet (15 tabs)   Fatigue/Hx of GI bleed: Patient reports that he has started feeling a little more fatigued lately and would like his blood counts checked. He has not noticed any recent blood in stool. States he has not tried following up with GI because of the busy holiday schedules, but he will see them if symptoms worsen.      All review of systems negative except what is listed in the HPI      Objective:    BP 134/71   Pulse 75   Temp 97.8 F (36.6 C)   Resp 16   Ht 6' (1.829 m)   Wt 184 lb 12.8 oz (83.8 kg)   SpO2 100%   BMI 25.06 kg/m    Physical Exam Vitals reviewed.  Constitutional:      General: He is not in acute distress.    Appearance: Normal appearance. He is not ill-appearing.  Cardiovascular:     Rate and Rhythm: Normal rate and regular rhythm.  Pulmonary:     Effort: Pulmonary effort is normal.     Breath sounds: Normal breath sounds.  Musculoskeletal:        General: No swelling or tenderness. Normal range of motion.     Cervical back: Normal range of motion and  neck supple.  Skin:    General: Skin is warm and dry.  Neurological:     General: No focal deficit present.     Mental Status: He is alert and oriented to person, place, and time. Mental status is at baseline.  Psychiatric:        Mood and Affect: Mood normal.        Behavior: Behavior normal.        Thought Content: Thought content normal.        Judgment: Judgment normal.        No results found for any visits on 03/17/22.      Assessment & Plan:   Problem List Items Addressed This Visit   None Visit Diagnoses     Anemia, unspecified type    -  Primary No signs of bleeding  Adding labs today    Relevant Orders   CBC   Iron, TIBC and Ferritin Panel   Chronic bilateral low back pain without sciatica     Reminded patient that I do not prescribe narcotics for chronic pain. I had previously referred him to ortho and PT, but he did not schedule appointments with them -  gave him their phone numbers to call to schedule. He will let me know if he needs new referrals.  Sending in lidocaine patches. Discussed supportive measures - heat, rest, massage, home exercises, proper body mechanics Offered to write him a work note to avoid heavy lifting, but he declined    Relevant Medications   lidocaine (HM LIDOCAINE PATCH) 4 %       Meds ordered this encounter  Medications   lidocaine (HM LIDOCAINE PATCH) 4 %    Sig: Place 1 patch onto the skin daily.    Dispense:  5 patch    Refill:  1    Order Specific Question:   Supervising Provider    Answer:   Danise Edge A [[id number]]    Return if symptoms worsen or fail to improve, for (schedule lab appointment).  [name], NP

## 2022-03-17 NOTE — Patient Instructions (Addendum)
Please call ortho back to get scheduled to see them regarding your chronic back pain.  [place]: [phone]  [facility]: [phone]

## 2022-03-30 ENCOUNTER — Ambulatory Visit: Payer: Medicare Other | Admitting: Gastroenterology

## 2022-03-30 ENCOUNTER — Other Ambulatory Visit (INDEPENDENT_AMBULATORY_CARE_PROVIDER_SITE_OTHER): Payer: Medicare Other

## 2022-03-30 ENCOUNTER — Encounter: Payer: Self-pay | Admitting: Gastroenterology

## 2022-03-30 VITALS — HR 82 | Ht 72.0 in | Wt 179.1 lb

## 2022-03-30 DIAGNOSIS — Z862 Personal history of diseases of the blood and blood-forming organs and certain disorders involving the immune mechanism: Secondary | ICD-10-CM

## 2022-03-30 DIAGNOSIS — G8929 Other chronic pain: Secondary | ICD-10-CM

## 2022-03-30 DIAGNOSIS — K269 Duodenal ulcer, unspecified as acute or chronic, without hemorrhage or perforation: Secondary | ICD-10-CM | POA: Diagnosis not present

## 2022-03-30 DIAGNOSIS — R195 Other fecal abnormalities: Secondary | ICD-10-CM | POA: Diagnosis not present

## 2022-03-30 DIAGNOSIS — F1990 Other psychoactive substance use, unspecified, uncomplicated: Secondary | ICD-10-CM

## 2022-03-30 DIAGNOSIS — K259 Gastric ulcer, unspecified as acute or chronic, without hemorrhage or perforation: Secondary | ICD-10-CM

## 2022-03-30 DIAGNOSIS — R1114 Bilious vomiting: Secondary | ICD-10-CM

## 2022-03-30 DIAGNOSIS — M549 Dorsalgia, unspecified: Secondary | ICD-10-CM

## 2022-03-30 LAB — LIPASE: Lipase: 2 U/L — ABNORMAL LOW (ref 11.0–59.0)

## 2022-03-30 LAB — COMPREHENSIVE METABOLIC PANEL
ALT: 10 U/L (ref 0–53)
AST: 19 U/L (ref 0–37)
Albumin: 3.7 g/dL (ref 3.5–5.2)
Alkaline Phosphatase: 98 U/L (ref 39–117)
BUN: 9 mg/dL (ref 6–23)
CO2: 28 mEq/L (ref 19–32)
Calcium: 8.5 mg/dL (ref 8.4–10.5)
Chloride: 103 mEq/L (ref 96–112)
Creatinine, Ser: 0.74 mg/dL (ref 0.40–1.50)
GFR: 93.23 mL/min (ref >60.00)
Glucose, Bld: 63 mg/dL — ABNORMAL LOW (ref 70–99)
Potassium: 4.2 mEq/L (ref 3.5–5.1)
Sodium: 139 mEq/L (ref 135–145)
Total Bilirubin: 0.4 mg/dL (ref 0.2–1.2)
Total Protein: 6.9 g/dL (ref 6.0–8.3)

## 2022-03-30 LAB — CBC
HCT: 38.7 % — ABNORMAL LOW (ref 39.0–52.0)
Hemoglobin: 12.9 g/dL — ABNORMAL LOW (ref 13.0–17.0)
MCHC: 33.3 g/dL (ref 30.0–36.0)
MCV: 103 fl — ABNORMAL HIGH (ref 78.0–100.0)
Platelets: 412 10*3/uL — ABNORMAL HIGH (ref 150.0–400.0)
RBC: 3.75 Mil/uL — ABNORMAL LOW (ref 4.22–5.81)
RDW: 17.2 % — ABNORMAL HIGH (ref 11.5–15.5)
WBC: 5.6 10*3/uL (ref 4.0–10.5)

## 2022-03-30 LAB — FOLATE: Folate: 13.5 ng/mL (ref >5.9)

## 2022-03-30 LAB — VITAMIN B12: Vitamin B-12: 345 pg/mL (ref 211–911)

## 2022-03-30 LAB — IBC + FERRITIN
Ferritin: 58 ng/mL (ref 22.0–322.0)
Iron: 49 ug/dL (ref 42–165)
Saturation Ratios: 18 % — ABNORMAL LOW (ref 20.0–50.0)
TIBC: 271.6 ug/dL (ref 250.0–450.0)
Transferrin: 194 mg/dL — ABNORMAL LOW (ref 212.0–360.0)

## 2022-03-30 MED ORDER — SUCRALFATE 1 G PO TABS: 1.0000 g | ORAL_TABLET | Freq: Four times a day (QID) | ORAL | 0 refills | Status: DC

## 2022-03-30 MED ORDER — PANTOPRAZOLE SODIUM 40 MG PO TBEC: 40.0000 mg | DELAYED_RELEASE_TABLET | Freq: Two times a day (BID) | ORAL | 2 refills | Status: DC

## 2022-03-30 NOTE — Progress Notes (Signed)
GASTROENTEROLOGY OUTPATIENT CLINIC VISIT   Primary Care Provider [name], NP [address] [place] [address] [phone]  Patient Profile: Nicholas Yates is a 67 y.o. male with a pmh significant for MDD, chronic back pain, PUD (manifested as GUs and DUs).  The patient presents to the [facility] for an evaluation and management of problem(s) noted below:  Problem List 1. Duodenal ulcer   2. Gastric ulcer without hemorrhage or perforation, unspecified chronicity   3. Dark stools   4. History of anemia   5. Bilious vomiting with nausea   6. Excessive use of nonsteroidal anti-inflammatory drug (NSAID)   7. Chronic back pain, unspecified back location, unspecified back pain laterality     History of Present Illness This is a patient that I met in the hospital in November who presented with melena and anemia.  He was found to have a significant amount of peptic ulcer disease and a large duodenal ulcer.  This was all in the setting of chronic back pain issues and the need for nonsteroidals.  He was initiated on PPI therapy twice daily and eventually was able to be discharged.  Patient returns for follow-up today and is accompanied by his significant other.  He states that he is concerned that he is still having active bleeding.  Unfortunately, the patient is still using Goody powders at least for a day because his back pain issues persist.  He has been offered a physical therapy evaluation but has not followed up with them because of scheduling issues.  Patient states that he is having persistent fatigue, just as much as he was having as before he went into the hospital.  He is having darker stools that are black and sometimes liquid.  He is taking his iron once to twice daily.  He has these tremors or shaking episodes that occur mostly in the morning when he wakes up which she is concerned as a result of persistent anemia.  He did have vomiting  last night but this was just bilious.  He has not had any hematemesis or coffee-ground emesis.  His bowel movements are between 3 and 4 a day.  He is using Tylenol 3000 to 4000 mg daily with inadequate pain control for his chronic back pain.  That is the reason why he is using NSAIDs still.  GI Review of Systems Positive as above Negative for pyrosis, dysphagia, abdominal pain, hematochezia  Review of Systems General: Denies fevers/chills/weight loss unintentionally Cardiovascular: Denies chest pain Pulmonary: Denies shortness of breath Gastroenterological: See HPI Genitourinary: Denies darkened urine Hematological: Denies easy bruising/bleeding Dermatological: Denies jaundice Psychological: Mood is anxious to get better   Medications Current Outpatient Medications  Medication Sig Dispense Refill   acetaminophen (TYLENOL) 500 MG tablet Take 1-2 tablets (500-1,000 mg total) by mouth every 8 (eight) hours as needed.     lidocaine (HM LIDOCAINE PATCH) 4 % Place 1 patch onto the skin daily. 5 patch 1   rosuvastatin (CRESTOR) 10 MG tablet Take 1 tablet (10 mg total) by mouth daily. 90 tablet 1   sucralfate (CARAFATE) 1 g tablet Take 1 tablet (1 g total) by mouth 4 (four) times daily. 90 tablet 0   tadalafil (CIALIS) 10 MG tablet TAKE 1 TABLET BY MOUTH ONCE DAILY AS NEEDED FOR ERECTILE DYSFUNCTION 30 tablet 0   venlafaxine XR (EFFEXOR-XR) 75 MG 24 hr capsule Take 3 capsules (225 mg total) by mouth daily with breakfast. 90 capsule 3  cyclobenzaprine (FLEXERIL) 5 MG tablet Take 1 tablet (5 mg total) by mouth 3 (three) times daily as needed for muscle spasms. (Patient not taking: Reported on 03/30/2022) 30 tablet 1   pantoprazole (PROTONIX) 40 MG tablet Take 1 tablet (40 mg total) by mouth 2 (two) times daily before a meal. 60 tablet 2   No current facility-administered medications for this visit.    Allergies No Known Allergies  Histories Past Medical History:  Diagnosis Date    Depression    GERD (gastroesophageal reflux disease)    had duodenal ulcer 5 months   Post traumatic stress disorder    Past Surgical History:  Procedure Laterality Date   BIOPSY  02/13/2022   Procedure: BIOPSY;  Surgeon: Rush Landmark Telford Nab., MD;  Location: [facility];  Service: Gastroenterology;;   ESOPHAGOGASTRODUODENOSCOPY (EGD) WITH PROPOFOL N/A 02/13/2022   Procedure: ESOPHAGOGASTRODUODENOSCOPY (EGD) WITH PROPOFOL;  Surgeon: Irving Copas., MD;  Location: [facility];  Service: Gastroenterology;  Laterality: N/A;   HERNIA REPAIR     TOTAL HIP ARTHROPLASTY     bilateral   Social History   Socioeconomic History   Marital status: Single    Spouse name: Not on file   Number of children: Not on file   Years of education: Not on file   Highest education level: Not on file  Occupational History   Not on file  Tobacco Use   Smoking status: Never   Smokeless tobacco: Never  Vaping Use   Vaping Use: Never used  Substance and Sexual Activity   Alcohol use: Yes    Alcohol/week: 42.0 standard drinks of alcohol    Types: 42 Cans of beer per week   Drug use: No   Sexual activity: Yes  Other Topics Concern   Not on file  Social History Narrative   Not on file   Social Determinants of Health   Financial Resource Strain: Not on file  Food Insecurity: No Food Insecurity (02/14/2022)   Hunger Vital Sign    Worried About Running Out of Food in the Last Year: Never true    Ran Out of Food in the Last Year: Never true  Transportation Needs: No Transportation Needs (02/14/2022)   PRAPARE - Hydrologist (Medical): No    Lack of Transportation (Non-Medical): No  Physical Activity: Not on file  Stress: Not on file  Social Connections: Not on file  Intimate Partner Violence: Not At Risk (02/14/2022)   Humiliation, Afraid, Rape, and Kick questionnaire    Fear of Current or Ex-Partner: No    Emotionally Abused: No    Physically Abused: No     Sexually Abused: No   Family History  Problem Relation Age of Onset   Colon cancer Neg Hx    Esophageal cancer Neg Hx    Inflammatory bowel disease Neg Hx    Liver disease Neg Hx    Pancreatic cancer Neg Hx    Rectal cancer Neg Hx    Stomach cancer Neg Hx    I have reviewed his medical, social, and family history in detail and updated the electronic medical record as necessary.    PHYSICAL EXAMINATION  Pulse 82   Ht 6' (1.829 m)   Wt 179 lb 2 oz (81.3 kg)   BMI 24.29 kg/m  Wt Readings from Last 3 Encounters:  03/30/22 179 lb 2 oz (81.3 kg)  03/17/22 184 lb 12.8 oz (83.8 kg)  02/16/22 184 lb 3.2 oz (83.6 kg)  GEN: NAD, appears stated age, doesn't appear chronically ill, accompanied by significant other PSYCH: Cooperative, without pressured speech EYE: Conjunctivae pink, sclerae anicteric ENT: MMM CV: Nontachycardic RESP: No audible wheezing GI: NABS, soft, NT/ND, without rebound or guarding MSK/EXT: No significant lower extremity edema SKIN: No jaundice NEURO:  Alert & Oriented x 3, no focal deficits   REVIEW OF DATA  I reviewed the following data at the time of this encounter:  GI Procedures and Studies  November 2023 upper endoscopy - No gross lesions in the entire esophagus. Z-line regular, 38 cm from the incisors. - 1 cm hiatal hernia. - Non-bleeding gastric ulcer with a clean ulcer base (Forrest Class III) in the antrum. - Erythematous mucosa in the stomach throughout. Biopsied. - Non-bleeding duodenal ulcer with a clean ulcer base (Forrest Class III) in the duodenal sweep. This led to a duodenal deformity/narrowing that was able to be traversed. The ulcer was biopsied. - Erythematous duodenopathy in the D1/D2 regions. Biopsied. - Non-bleeding duodenal diverticulum and D3.  2016 colonoscopy Incomplete colonoscopy due to poor preparation  2016 EGD Extensive gastritis with antral ulcerations and duodenitis Lots of green food in stomach and duodenal  bulb GE junction 40 cm with a small hiatal hernia Esophagus normal Inflammatory stricture at just past the duodenal bulb that could not be traversed  Laboratory Studies  Reviewed those in epic  Imaging Studies  No relevant studies to review   ASSESSMENT  Nicholas Yates is a 67 y.o. male with a pmh significant for MDD, chronic back pain, PUD (manifested as GUs and DUs).  The patient is seen today for evaluation and management of:  1. Duodenal ulcer   2. Gastric ulcer without hemorrhage or perforation, unspecified chronicity   3. Dark stools   4. History of anemia   5. Bilious vomiting with nausea   6. Excessive use of nonsteroidal anti-inflammatory drug (NSAID)   7. Chronic back pain, unspecified back location, unspecified back pain laterality    The patient is hemodynamically stable.  Clinically, the patient from a GI standpoint seems to be experiencing continued dark stools in the setting of what could be active ongoing GI bleeding or potentially iron supplementation issues.  He has not had a repeat hemoglobin since his hospitalization.  We need to recheck that.  He certainly has a risk of recurrent bleeding with the significant ulceration that was found in his duodenum.  He has not had true colon cancer screening as he had an incomplete colonoscopy years ago at [facility] clinic and never had follow-up.  I think an upper and lower endoscopy makes sense and as long as his hemoglobin is reasonable greater than 8, I think we can perform these procedures upstairs.  In the setting that if her hemoglobin is found to be less than 8 or especially less than 7, he will need to be directed to the hospital/emergency department for potential blood transfusion and endoscopic evaluation in the hospital-based setting.  I am going to optimize his upper GI tract healing by starting him on Carafate for now.  Based on what his labs are, we are getting stat labs today, we will decide neck steps in his evaluation but  we will go ahead and get him plan for procedures in the [facility].  The patient's continued nonsteroidal use is very concerning and I certainly think that it makes sense for him to be evaluated by chronic pain management services, and I will place that referral today.  In the interim, I will  forward this note to his PCP, in case any additional therapies can be offered.  I did encourage the patient to follow-up with the referrals that his PCP placed at the time of their last clinic visit.  The risks and benefits of endoscopic evaluation were discussed with the patient; these include but are not limited to the risk of perforation, infection, bleeding, missed lesions, lack of diagnosis, severe illness requiring hospitalization, as well as anesthesia and sedation related illnesses.  The patient and/or family is agreeable to proceed.  All patient questions were answered to the best of my ability, and the patient agrees to the aforementioned plan of action with follow-up as indicated.   PLAN  Laboratories as outlined below Proceed with scheduling EGD/colonoscopy Should patient be found to have significant persisting anemia or progressive anemia then he will be called today in case he needs to go to the hospital for blood transfusion or hospital-based procedures Continue PPI 40 mg twice daily Initiate Carafate 1 g 4 times daily Chronic pain management services referral Follow-up with previous referrals placed by PCP for management of back pain Minimize all NSAID use as much as possible   Orders Placed This Encounter  Procedures   CBC   Comp Met (CMET)   Lipase   IBC + Ferritin   B12   Folate   Ambulatory referral to Gastroenterology   Ambulatory referral to Pain Clinic    New Prescriptions   SUCRALFATE (CARAFATE) 1 G TABLET    Take 1 tablet (1 g total) by mouth 4 (four) times daily.   Modified Medications   Modified Medication Previous Medication   PANTOPRAZOLE (PROTONIX) 40 MG TABLET pantoprazole  (PROTONIX) 40 MG tablet      Take 1 tablet (40 mg total) by mouth 2 (two) times daily before a meal.    Take 1 tablet (40 mg total) by mouth 2 (two) times daily before a meal.    Planned Follow Up No follow-ups on file.   Total Time in Face-to-Face and in Coordination of Care for patient including independent/personal interpretation/review of prior testing, medical history, examination, medication adjustment, communicating results with the patient directly, and documentation within the EHR is 30 minutes.   Justice Britain, MD [facility] Office # [phone]

## 2022-03-30 NOTE — Patient Instructions (Signed)
Your provider has requested that you go to the basement level for lab work before leaving today. Press "B" on the elevator. The lab is located at the first door on the left as you exit the elevator.  You have been referred to pain management for Chronic Back Pain. Their office will contact you with an appointment. If you have not heard from their office in 1-2 weeks please let us know.   We have sent the following medications to your pharmacy for you to pick up at your convenience: Suprep, Carafate, Pantoprazole   You have been scheduled for an endoscopy and colonoscopy. Please follow the written instructions given to you at your visit today. Please pick up your prep supplies at the pharmacy within the next 1-3 days. If you use inhalers (even only as needed), please bring them with you on the day of your procedure.  Due to recent changes in healthcare laws, you may see the results of your imaging and laboratory studies on MyChart before your provider has had a chance to review them.  We understand that in some cases there may be results that are confusing or concerning to you. Not all laboratory results come back in the same time frame and the provider may be waiting for multiple results in order to interpret others.  Please give [place] 48 hours in order for your provider to thoroughly review all the results before contacting the office for clarification of your results.   Thank you for choosing me and [facility].  Dr. Rush Landmark

## 2022-03-31 ENCOUNTER — Encounter: Payer: Self-pay | Admitting: Gastroenterology

## 2022-03-31 DIAGNOSIS — Z862 Personal history of diseases of the blood and blood-forming organs and certain disorders involving the immune mechanism: Secondary | ICD-10-CM | POA: Insufficient documentation

## 2022-03-31 DIAGNOSIS — R1114 Bilious vomiting: Secondary | ICD-10-CM | POA: Insufficient documentation

## 2022-03-31 DIAGNOSIS — R195 Other fecal abnormalities: Secondary | ICD-10-CM | POA: Insufficient documentation

## 2022-03-31 DIAGNOSIS — F1990 Other psychoactive substance use, unspecified, uncomplicated: Secondary | ICD-10-CM | POA: Insufficient documentation

## 2022-04-06 ENCOUNTER — Other Ambulatory Visit: Payer: Self-pay | Admitting: Family Medicine

## 2022-04-06 DIAGNOSIS — N529 Male erectile dysfunction, unspecified: Secondary | ICD-10-CM

## 2022-04-11 ENCOUNTER — Encounter: Payer: Self-pay | Admitting: Physical Medicine and Rehabilitation

## 2022-04-17 ENCOUNTER — Encounter (HOSPITAL_COMMUNITY): Payer: Self-pay

## 2022-04-17 ENCOUNTER — Emergency Department (HOSPITAL_COMMUNITY): Payer: Medicare Other

## 2022-04-17 ENCOUNTER — Other Ambulatory Visit: Payer: Self-pay

## 2022-04-17 ENCOUNTER — Emergency Department (HOSPITAL_COMMUNITY)
Admission: EM | Admit: 2022-04-17 | Discharge: 2022-04-17 | Disposition: A | Discharge status: Home / Self Care | Payer: Medicare Other | Attending: Emergency Medicine | Admitting: Emergency Medicine

## 2022-04-17 DIAGNOSIS — Z96643 Presence of artificial hip joint, bilateral: Secondary | ICD-10-CM | POA: Insufficient documentation

## 2022-04-17 DIAGNOSIS — K253 Acute gastric ulcer without hemorrhage or perforation: Secondary | ICD-10-CM | POA: Diagnosis not present

## 2022-04-17 DIAGNOSIS — F1090 Alcohol use, unspecified, uncomplicated: Secondary | ICD-10-CM | POA: Diagnosis not present

## 2022-04-17 DIAGNOSIS — R0789 Other chest pain: Secondary | ICD-10-CM | POA: Diagnosis not present

## 2022-04-17 DIAGNOSIS — W19XXXA Unspecified fall, initial encounter: Secondary | ICD-10-CM | POA: Diagnosis not present

## 2022-04-17 DIAGNOSIS — S7000XA Contusion of unspecified hip, initial encounter: Secondary | ICD-10-CM | POA: Diagnosis not present

## 2022-04-17 DIAGNOSIS — R296 Repeated falls: Secondary | ICD-10-CM | POA: Insufficient documentation

## 2022-04-17 DIAGNOSIS — M25551 Pain in right hip: Secondary | ICD-10-CM | POA: Diagnosis not present

## 2022-04-17 DIAGNOSIS — I2089 Other forms of angina pectoris: Secondary | ICD-10-CM | POA: Insufficient documentation

## 2022-04-17 DIAGNOSIS — R079 Chest pain, unspecified: Secondary | ICD-10-CM

## 2022-04-17 DIAGNOSIS — M25552 Pain in left hip: Secondary | ICD-10-CM | POA: Diagnosis not present

## 2022-04-17 LAB — COMPREHENSIVE METABOLIC PANEL
ALT: 15 U/L (ref 0–44)
AST: 26 U/L (ref 15–41)
Albumin: 3.2 g/dL — ABNORMAL LOW (ref 3.5–5.0)
Alkaline Phosphatase: 97 U/L (ref 38–126)
Anion gap: 8 (ref 5–15)
BUN: 14 mg/dL (ref 8–23)
CO2: 22 mmol/L (ref 22–32)
Calcium: 8.3 mg/dL — ABNORMAL LOW (ref 8.9–10.3)
Chloride: 105 mmol/L (ref 98–111)
Creatinine, Ser: 0.84 mg/dL (ref 0.61–1.24)
GFR, Estimated: >60 mL/min (ref >60)
Glucose, Bld: 73 mg/dL (ref 70–99)
Potassium: 4.5 mmol/L (ref 3.5–5.1)
Sodium: 135 mmol/L (ref 135–145)
Total Bilirubin: 0.2 mg/dL — ABNORMAL LOW (ref 0.3–1.2)
Total Protein: 6.4 g/dL — ABNORMAL LOW (ref 6.5–8.1)

## 2022-04-17 LAB — CBC WITH DIFFERENTIAL/PLATELET
Abs Immature Granulocytes: 0.02 10*3/uL (ref 0.00–0.07)
Basophils Absolute: 0.1 10*3/uL (ref 0.0–0.1)
Basophils Relative: 1 %
Eosinophils Absolute: 0.1 10*3/uL (ref 0.0–0.5)
Eosinophils Relative: 2 %
HCT: 37.5 % — ABNORMAL LOW (ref 39.0–52.0)
Hemoglobin: 12 g/dL — ABNORMAL LOW (ref 13.0–17.0)
Immature Granulocytes: 0 %
Lymphocytes Relative: 27 %
Lymphs Abs: 1.8 10*3/uL (ref 0.7–4.0)
MCH: 33.8 pg (ref 26.0–34.0)
MCHC: 32 g/dL (ref 30.0–36.0)
MCV: 105.6 fL — ABNORMAL HIGH (ref 80.0–100.0)
Monocytes Absolute: 0.6 10*3/uL (ref 0.1–1.0)
Monocytes Relative: 9 %
Neutro Abs: 4.1 10*3/uL (ref 1.7–7.7)
Neutrophils Relative %: 61 %
Platelets: 398 10*3/uL (ref 150–400)
RBC: 3.55 MIL/uL — ABNORMAL LOW (ref 4.22–5.81)
RDW: 14.6 % (ref 11.5–15.5)
WBC: 6.8 10*3/uL (ref 4.0–10.5)
nRBC: 0 % (ref 0.0–0.2)

## 2022-04-17 LAB — TROPONIN I (HIGH SENSITIVITY): Troponin I (High Sensitivity): 4 ng/L (ref <18)

## 2022-04-17 LAB — ETHANOL: Alcohol, Ethyl (B): 42 mg/dL — ABNORMAL HIGH (ref <10)

## 2022-04-17 LAB — ACETAMINOPHEN LEVEL: Acetaminophen (Tylenol), Serum: <10 ug/mL — ABNORMAL LOW (ref 10–30)

## 2022-04-17 LAB — SALICYLATE LEVEL: Salicylate Lvl: <7 mg/dL — ABNORMAL LOW (ref 7.0–30.0)

## 2022-04-17 NOTE — Discharge Instructions (Addendum)
Your testing today was reassuring, you do have a stomach ulcer which will continue to get worse as long as you drink alcohol and take Goody powders which contain aspirin.  Please stop using these medications immediately, reduce the amount of alcohol you drink, do not go cold [place] but gradually reduce the amount over the next couple of weeks.  Do this in conjunction with talking with your family doctor.  You have not lost a significant amount of blood from your ulcer, if you continue to take your medications for your stomach ulcer and will gradually get better.  Most importantly you have had some chest pain with exertion.  Your heart test have been okay today but you will need to see the heart doctor.  I have given you their phone number above.  Please call the office Monday morning to make an appointment.  You should be seen within the next 3 days  Return to the emergency department immediately for severe worsening symptoms

## 2022-04-17 NOTE — ED Triage Notes (Signed)
Pt c/o a fall on the 22nd and now has bilateral hip pain. Pt also c/o chest pain started yesterday on left side. Pt also has blood in his stool that his gastro dr is following.

## 2022-04-17 NOTE — ED Notes (Signed)
Patient complains of bilateral hip pain from a fall and chest pain that has been present for awhile

## 2022-04-17 NOTE — ED Provider Notes (Signed)
[place] EMERGENCY DEPARTMENT AT [facility] Provider Note   CSN: [id number] Arrival date & time: 04/17/22  1300     History  Chief Complaint  Patient presents with   Fall   Chest Pain    Nicholas Yates is a 67 y.o. male.   Fall Associated symptoms include chest pain.  Chest Pain  This patient is a 67 year old male, he has a history of chronic pain in his back status post kyphoplasty, he has had pain in his hips after hip replacement, he has had a history of stomach ulcers status post endoscopy in November with proton pump inhibitor therapy afterwards secondary to GI bleeding.  He had actually seen his gastrointestinal doctor in follow-up on January 10 during which time he was encouraged to stop using anti-inflammatories as he continues to use aspirin containing products.  He also continues to use large amounts of Tylenol.  He was scheduled for another endoscopy but it is not until March.  He reports that he still has intermittent black and tarry stools, today he had a brown stool.  He does not have any significant amount of abdominal pain but is having increasing hip pain, generalized weakness and has had a couple of falls over the week.  He also endorses some chest pain in the left side of his chest with exertion, he is not having that chest pain now.  He has never seen a cardiologist and after review of the medical record he does not have any history of stress tests or echocardiograms.  He does endorse drinking large amounts of alcohol daily including 6 or 7 tall beers every day to help with the "pain".     Home Medications Prior to Admission medications   Medication Sig Start Date End Date Taking? Authorizing Provider  acetaminophen (TYLENOL) 500 MG tablet Take 1-2 tablets (500-1,000 mg total) by mouth every 8 (eight) hours as needed. 02/14/22   Narda Bonds, MD  cyclobenzaprine (FLEXERIL) 5 MG tablet Take 1 tablet (5 mg total) by mouth 3 (three) times daily as needed  for muscle spasms. Patient not taking: Reported on 03/30/2022 02/07/22   [name] B, NP  lidocaine (HM LIDOCAINE PATCH) 4 % Place 1 patch onto the skin daily. 03/17/22   [name], NP  pantoprazole (PROTONIX) 40 MG tablet Take 1 tablet (40 mg total) by mouth 2 (two) times daily before a meal. 03/30/22 06/28/22  Mansouraty, Netty Starring., MD  rosuvastatin (CRESTOR) 10 MG tablet Take 1 tablet (10 mg total) by mouth daily. 02/07/22   [name], NP  sucralfate (CARAFATE) 1 g tablet Take 1 tablet (1 g total) by mouth 4 (four) times daily. 03/30/22   Mansouraty, Netty Starring., MD  tadalafil (CIALIS) 10 MG tablet TAKE 1 TABLET BY MOUTH ONCE DAILY AS NEEDED FOR ERECTILE DYSFUNCTION 04/06/22   [name], NP  venlafaxine XR (EFFEXOR-XR) 75 MG 24 hr capsule Take 3 capsules (225 mg total) by mouth daily with breakfast. 02/07/22   [name], NP      Allergies    Patient has no known allergies.    Review of Systems   Review of Systems  Cardiovascular:  Positive for chest pain.  All other systems reviewed and are negative.   Physical Exam Updated Vital Signs BP (!) 155/66 (BP Location: Left Arm)   Pulse 72   Temp 98.3 F (36.8 C) (Oral)   Resp 17   Ht 1.829 m (6')   Wt 83.6  kg   SpO2 99%   BMI 24.98 kg/m  Physical Exam Vitals and nursing note reviewed.  Constitutional:      General: He is not in acute distress.    Appearance: He is well-developed.  HENT:     Head: Normocephalic and atraumatic.     Mouth/Throat:     Mouth: Mucous membranes are moist.     Pharynx: No oropharyngeal exudate.  Eyes:     General: No scleral icterus.       Right eye: No discharge.        Left eye: No discharge.     Conjunctiva/sclera: Conjunctivae normal.     Pupils: Pupils are equal, round, and reactive to light.  Neck:     Thyroid: No thyromegaly.     Vascular: No JVD.  Cardiovascular:     Rate and Rhythm: Normal rate and regular rhythm.     Heart sounds: Normal heart sounds. No murmur  heard.    No friction rub. No gallop.  Pulmonary:     Effort: Pulmonary effort is normal. No respiratory distress.     Breath sounds: Normal breath sounds. No wheezing or rales.  Abdominal:     General: Bowel sounds are normal. There is no distension.     Palpations: Abdomen is soft. There is no mass.     Tenderness: There is no abdominal tenderness.  Musculoskeletal:        General: No tenderness. Normal range of motion.     Cervical back: Normal range of motion and neck supple.     Right lower leg: No edema.     Left lower leg: No edema.     Comments: Patient actually has fairly good range of motion of his bilateral hips without crepitance or subcutaneous emphysema, he has no leg length discrepancies.  Good range of motion of the knees and hips  Lymphadenopathy:     Cervical: No cervical adenopathy.  Skin:    General: Skin is warm and dry.     Findings: No erythema or rash.  Neurological:     Mental Status: He is alert.     Coordination: Coordination normal.  Psychiatric:        Behavior: Behavior normal.     ED Results / Procedures / Treatments   Labs (all labs ordered are listed, but only abnormal results are displayed) Labs Reviewed  CBC WITH DIFFERENTIAL/PLATELET - Abnormal; Notable for the following components:      Result Value   RBC 3.55 (*)    Hemoglobin 12.0 (*)    HCT 37.5 (*)    MCV 105.6 (*)    All other components within normal limits  ACETAMINOPHEN LEVEL - Abnormal; Notable for the following components:   Acetaminophen (Tylenol), Serum <10 (*)    All other components within normal limits  ETHANOL - Abnormal; Notable for the following components:   Alcohol, Ethyl (B) 42 (*)    All other components within normal limits  COMPREHENSIVE METABOLIC PANEL - Abnormal; Notable for the following components:   Calcium 8.3 (*)    Total Protein 6.4 (*)    Albumin 3.2 (*)    Total Bilirubin 0.2 (*)    All other components within normal limits  SALICYLATE LEVEL -  Abnormal; Notable for the following components:   Salicylate Lvl [id number] (*)    All other components within normal limits  TROPONIN I (HIGH SENSITIVITY)    EKG EKG Interpretation  Date/Time:  Sunday April 17 2022 13:12:30 EST  Ventricular Rate:  68 PR Interval:  166 QRS Duration: 95 QT Interval:  417 QTC Calculation: 444 R Axis:   -32 Text Interpretation: Sinus rhythm Atrial premature complexes in couplets Left axis deviation Borderline low voltage, extremity leads Abnormal R-wave progression, early transition Confirmed by Noemi Chapel [phone]) on 04/17/2022 1:29:22 PM  Radiology DG Hips Bilat W or Wo Pelvis 3-4 Views  Result Date: 04/17/2022 CLINICAL DATA:  Bilateral hip pain following multiple falls. EXAM: DG HIP (WITH OR WITHOUT PELVIS) 3-4V BILAT COMPARISON:  06/09/2021 FINDINGS: Again demonstrated are bilateral total hip prostheses in satisfactory position and alignment. Interval L5 vertebral compression deformity with kyphoplasty material. Diffuse osteopenia. No fracture or dislocation seen. IMPRESSION: 1. No fracture or dislocation. 2. Bilateral total hip prostheses. Electronically Signed   By: Claudie Revering M.D.   On: 04/17/2022 14:19    Procedures Procedures    Medications Ordered in ED Medications - No data to display  ED Course/ Medical Decision Making/ A&P                             Medical Decision Making Amount and/or Complexity of Data Reviewed Labs: ordered. Radiology: ordered.   This patient presents to the ED for concern of hip pain after falls, generalized weakness in the presence of using excessive amounts of Tylenol aspirin and alcohol, exertional chest pain with radiation of the shoulder and some he has no history of known coronary disease, this involves an extensive number of treatment options, and is a complaint that carries with it a high risk of complications and morbidity.  The differential diagnosis includes ongoing GI bleeding, liver toxicity, renal  toxicity, medication overdose albeit accidental, the patient is not weak on exam but does have some generalized weakness which could be related to multiple different things including potential anemia or GI bleed or electrolyte disturbance.   Co morbidities that complicate the patient evaluation  Chronic alcohol use, NSAID use, known GI bleed and stomach ulcers   Additional history obtained:  Additional history obtained from electronic medical record External records from outside source obtained and reviewed including hospitalizations and endoscopy reports from November, GI follow-up in January   Lab Tests:  I Ordered, and personally interpreted labs.  The pertinent results include: Troponin unremarkable and undetectable, alcohol slightly elevated, acetaminophen undetectable, metabolic panel unremarkable, CBC with a hemoglobin of 12.0 which is essentially unchanged from earlier in the month.  Although it is slightly lower it is of no significant concern.   Imaging Studies ordered:  I ordered imaging studies including x-rays of the hips without dislocation or fractures, pelvis okay I independently visualized and interpreted imaging which showed no fractures I agree with the radiologist interpretation   Cardiac Monitoring: / EKG:  The patient was maintained on a cardiac monitor.  I personally viewed and interpreted the cardiac monitored which showed an underlying rhythm of: Sinus rhythm   Consultations Obtained:  Referred to cardiology as an outpatient secondary to the weight that he describes as exertional angina.  Again he has not had chest pain the entire time he has been here  Problem List / ED Course / Critical interventions / Medication management  Exertional angina-referred to cardiology Pain in the hips, cautioned and educated the patient extensively on use of NSAIDs and alcohol and how this is going to cause him more bleeding Generalized weakness, likely unrelated to pain  medicine alcohol use etc.  No acute findings of weakness here,  patient stable for discharge to follow-up in the outpatient settingI have reviewed the patients home medicines and have made adjustments as needed   Social Determinants of Health:  Heavy alcohol use   Test / Admission - Considered:  Considered admission but no acute findings to suggest the need for inpatient care.  Ambulatory referral to cardiology given, patient stable for discharge, expresses understanding         Final Clinical Impression(s) / ED Diagnoses Final diagnoses:  Angina of effort  Chest pain, unspecified type  Contusion of hip, unspecified laterality, initial encounter  Acute gastric ulcer without hemorrhage or perforation    Rx / DC Orders ED Discharge Orders          Ordered    Ambulatory referral to Cardiology        04/17/22 1522              Noemi Chapel, MD 04/17/22 1526

## 2022-04-17 NOTE — ED Notes (Signed)
EDP at bedside for triage

## 2022-04-21 ENCOUNTER — Telehealth: Payer: Self-pay

## 2022-04-21 NOTE — Telephone Encounter (Signed)
        Patient  visited [facility] on 04/17/2022  for Fall, chest pain.   Telephone encounter attempt :  1st  A HIPAA compliant voice message was left requesting a return call.  Instructed patient to call back at [phone].   [place] Resource Care Guide   [email or web address]  ?? [phone]   Website: [email or web address]  [email or web address]

## 2022-04-25 ENCOUNTER — Telehealth: Payer: Self-pay

## 2022-04-25 NOTE — Telephone Encounter (Signed)
        Patient  visited [facility] on 04/17/2022  for Fall, chest pain.   Telephone encounter attempt :  2nd  A HIPAA compliant voice message was left requesting a return call.  Instructed patient to call back at [phone].   [place] Resource Care Guide   [email or web address]  ?? [phone]   Website: [email or web address]  [email or web address]

## 2022-04-28 ENCOUNTER — Telehealth: Payer: Self-pay

## 2022-04-28 ENCOUNTER — Other Ambulatory Visit: Payer: Self-pay | Admitting: Family Medicine

## 2022-04-28 DIAGNOSIS — N529 Male erectile dysfunction, unspecified: Secondary | ICD-10-CM

## 2022-04-28 NOTE — Telephone Encounter (Signed)
        Patient  visited [facility] on 04/17/2022  for Fall, chest pain.   Telephone encounter attempt : 3rd   A HIPAA compliant voice message was left requesting a return call.  Instructed patient to call back at [phone].   [place] Resource Care Guide   [email or web address]  ?? [phone]   Website: [email or web address]  [email or web address]

## 2022-05-01 NOTE — Progress Notes (Unsigned)
HPI Pain Inventory Average Pain 9 Pain Right Now 8 My pain is sharp, burning, stabbing, and aching  In the last 24 hours, has pain interfered with the following? General activity 6 Relation with others 6 Enjoyment of life 10 What TIME of day is your pain at its worst? morning  and daytime Sleep (in general) Fair  Pain is worse with: walking, bending, and standing Pain improves with: rest Relief from Meds:  .  walk with assistance ability to climb steps?  no do you drive?  no  employed # of hrs/week 40 I need assistance with the following:  meal prep and household duties  weakness trouble walking  Any changes since last visit?  no  Any changes since last visit?  no    Family History  Problem Relation Age of Onset   Colon cancer Neg Hx    Esophageal cancer Neg Hx    Inflammatory bowel disease Neg Hx    Liver disease Neg Hx    Pancreatic cancer Neg Hx    Rectal cancer Neg Hx    Stomach cancer Neg Hx    Social History   Socioeconomic History   Marital status: Single    Spouse name: Not on file   Number of children: Not on file   Years of education: Not on file   Highest education level: Not on file  Occupational History   Not on file  Tobacco Use   Smoking status: Never   Smokeless tobacco: Never  Vaping Use   Vaping Use: Never used  Substance and Sexual Activity   Alcohol use: Yes    Alcohol/week: 42.0 standard drinks of alcohol    Types: 42 Cans of beer per week    Comment: daily   Drug use: Not on file   Sexual activity: Yes  Other Topics Concern   Not on file  Social History Narrative   Not on file   Social Determinants of Health   Financial Resource Strain: Not on file  Food Insecurity: No Food Insecurity (02/14/2022)   Hunger Vital Sign    Worried About Running Out of Food in the Last Year: Never true    Ran Out of Food in the Last Year: Never true  Transportation Needs: No Transportation Needs (02/14/2022)   PRAPARE - Armed forces logistics/support/administrative officer (Medical): No    Lack of Transportation (Non-Medical): No  Physical Activity: Not on file  Stress: Not on file  Social Connections: Not on file   Past Surgical History:  Procedure Laterality Date   BIOPSY  02/13/2022   Procedure: BIOPSY;  Surgeon: Rush Landmark Telford Nab., MD;  Location: [facility];  Service: Gastroenterology;;   ESOPHAGOGASTRODUODENOSCOPY (EGD) WITH PROPOFOL N/A 02/13/2022   Procedure: ESOPHAGOGASTRODUODENOSCOPY (EGD) WITH PROPOFOL;  Surgeon: Irving Copas., MD;  Location: [facility];  Service: Gastroenterology;  Laterality: N/A;   HERNIA REPAIR     TOTAL HIP ARTHROPLASTY     bilateral   Past Medical History:  Diagnosis Date   Depression    GERD (gastroesophageal reflux disease)    had duodenal ulcer 5 months   Post traumatic stress disorder    Pulse 79   Ht 6' (1.829 m)   Wt 179 lb (81.2 kg)   SpO2 99%   BMI 24.28 kg/m   Opioid Risk Score:   Fall Risk Score:  `1  Depression screen [facility] 2/9     05/02/2022   10:30 AM 02/16/2022   11:32 AM 11/01/2021  3:14 PM 04/29/2021    2:55 PM 04/26/2021    4:53 PM 04/02/2021   11:20 AM 12/07/2020   10:08 AM  Depression screen PHQ 2/9  Decreased Interest 0 0 2 2 0 0 1  Down, Depressed, Hopeless 0 0 1 1 0 0 1  PHQ - 2 Score 0 0 3 3 0 0 2  Altered sleeping 0 3 3 1   1  $ Tired, decreased energy 0 3 2 3   1  $ Change in appetite 0 2 2 2   1  $ Feeling bad or failure about yourself  0 0 1 0   0  Trouble concentrating 0 0 1 1   0  Moving slowly or fidgety/restless 0 0 0 1   0  Suicidal thoughts 0 0 0 0   0  PHQ-9 Score 0 8 12 11   5  $ Difficult doing work/chores Not difficult at all Not difficult at all Very difficult Very difficult       Nicholas Yates is a 67 y.o. year old male  who  has a past medical history of Depression, GERD (gastroesophageal reflux disease), and Post traumatic stress disorder.   They are presenting to PM&R clinic as a new patient for pain management evaluation. They  were referred by  Mansouraty, Telford Nab., MD   for treatment of chronic low back pain.   Per their last note: "the patient is still using Goody powders at least for a day because his back pain issues persist. He has been offered a physical therapy evaluation but has not followed up with them because of scheduling issues... He is using Tylenol 3000 to 4000 mg daily with inadequate pain control for his chronic back pain. That is the reason why he is using NSAIDs still."   MRI lumbar spine 2/23:  Alignment:  Slight retrolisthesis at L5-S1, L2-3, and L1-2.   Vertebrae: Marrow edema in the lower half of the L5 body where there is a hypointense fracture plane best seen right parasagittal following the inferior endplate, with mild inferior endplate depression. No visible bone lesion. Remote superior endplate fracture of L1 and inferior endplate fracture of [id number].   Disc levels: T12- L1: Probable intervertebral ankylosis related to remote L1 superior endplate fracture [id number]: Disc narrowing and endplate degeneration with disc bulging. Mild facet spurring L2-L3: Disc narrowing and endplate degeneration with bulging into the inferior foramina. Asymmetric left facet spurring. Mild left foraminal narrowing L3-L4: Disc narrowing and bulging. Facet spurring and ligamentum flavum thickening. Patent canal and foramina L4-L5: Disc narrowing and bulging. Degenerative facet spurring and ligamentous thickening. Noncompressive thecal sac narrowing. Patent foramina L5-S1:Disc narrowing and bulging with degenerative facet spurring. Mild bilateral foraminal narrowing   Source: Low back, "right across my back just above my rear end"; bilateral hips Inciting incident: Fell 2 weeks prior, slipping on stairs and then later trying to get to the bed. Fell on both sides; went to ER with no fracture, but both still hurt.   Broke his back twice; forst one was repaired with kyphoplasty, second was a fork lift accident  that wasn't repaired.   Description of pain: constant, stabbing, and aching but better with sustained activity; worse when first moving Severity: On average 7 /10. At worst 10 /10. At best 6 /10. Exacerbating factors:  changing position, laying flat, sitting, standing, and activity Remitting factors: pain medication and movement Red flag symptoms: No red flags for back pain endorsed in Hx or ROS  Medications tried: Topical  medications (mild effect) : "everything"; salon pas, gels; temporary effect, has to use every hour for effect Nsaids (mild effect) : Goody powder, 5-6 packs per day (250 mg Tylenol, 520 mg aspirin, caffiene per packet)  Has been on meloxicam, without benefit.   Tylenol  (mild effect) : He is using Tylenol 3000 to 4000 mg daily; 3 tylenol 4x daily   Opiates  (moderate effect) : Kept pain below 4 when PCP prescribed Oxy-acetaminophen 15 tabs 02/17/22; took 2 tabs per day. Didn't have to use goody powder or tylenol when on this. No s/e.   Has been on tramadol in the past; did not help.   Gabapentin / Lyrica  (never tried) :  TCAs  (never tried) :  SNRIs  (never tried) :  Other  (no effect) : Muscle relaxants.   Other treatments: PT/OT  (unsure of effect) : Eval today Accupuncture/chiropractor/massage  (never tried) :  TENs unit (mild effect) : Did with PT in the past; felt good at the time but did not last.  Injections (mild effect) : Steroid shots into low back (esi); lasted for 3-4 days Surgery (unsure of effect) : Bilateral hip replacements; Kyphoplasty 1 year prior which did not help.   Other  (never tried) : Puts items in his left shoe d/t leg length discrepancy post-op hip replacement.   Goals for pain control: has been out of work at the [employer] in [place]; wants to continue working for the income.   Prior UDS results: No results found for: "LABOPIA", "COCAINSCRNUR", "LABBENZ", "AMPHETMU", "THCU", "LABBARB"   Drinks 5-6 beers per day - working on  cutting back per GI, trying to get down to one per day. No recreational drug use, no smoking.  ROS: Review of Systems  Musculoskeletal:  Positive for back pain.       B/L hip pain  All other systems reviewed and are negative.  PE: Constitution: Appropriate appearance for age. No apparent distress  HEENT: PERRL, EOMI grossly intact.  Resp: CTAB. No rales, rhonchi, or wheezing. Cardio: RRR. No mumurs, rubs, or gallops.  No peripheral edema. Abdomen: Nondistended. Nontender. +bowel sounds. Psych: Appropriate mood and affect. Neuro: AAOx4. No apparent deficits   Neurologic Exam:   DTRs: Reflexes were 2+ in bilateral achilles, patella, biceps, BR and triceps. Babinsky: flexor responses b/l.   Hoffmans: negative b/l Sensory exam: revealed normal sensation in all dermatomal regions in bilateral lower extremities Motor exam: strength 5/5 throughout bilateral lower extremities Coordination: Fine motor coordination was normal.   Gait: +antalgic gait, favors R leg, L hip hike with R leg swing through.   Back Exam:   Inspection: Pelvis was even; GT and medial malleoli approx 1/2 inch superior on the left compared to the right.  Lumbar lordotic curvature was  reduced.  There was no evidence of scoliosis.  Palpation: Palpatory exam revealed ttp at the bilateral lumbar paraspinals and PSIS . There was evidence of spasm in bilateral pasaspinals and quad muscles with ROM/gaurding.   ROM revealed restricted ROM in bilateral hip internal rotation (0) and external rotation (5 degrees) . Special/provocative testing:    SLR: + Left, no shooting pains   Slump test: -    Facet loading: very + bilaterally (very non-specific)   TTP at paraspinals: very + bilaterally (sensitive for facet pain...if no ttp then likely not facet pain)   Corky Sox test: low back pain b/l   FAIR test: -   Gaenslen test: -   Yeoman's test: -  Thomas Test: -   Information in () parenthesis is normals/details of specific  exam.   Assessment and Plan: Daryen Teaney is a 67 y.o. year old male  who  has a past medical history of Depression, GERD (gastroesophageal reflux disease), and Post traumatic stress disorder.   They are presenting to PM&R clinic as a new patient for treatment of chronic low back pain.   There are no diagnoses linked to this encounter.

## 2022-05-02 ENCOUNTER — Ambulatory Visit: Payer: Medicare Other | Attending: Family Medicine

## 2022-05-02 ENCOUNTER — Encounter
Payer: Medicare Other | Attending: Physical Medicine and Rehabilitation | Admitting: Physical Medicine and Rehabilitation

## 2022-05-02 VITALS — BP 138/87 | HR 79 | Ht 72.0 in | Wt 179.0 lb

## 2022-05-02 DIAGNOSIS — M479 Spondylosis, unspecified: Secondary | ICD-10-CM | POA: Diagnosis not present

## 2022-05-02 DIAGNOSIS — M2170 Unequal limb length (acquired), unspecified site: Secondary | ICD-10-CM | POA: Diagnosis not present

## 2022-05-02 DIAGNOSIS — M5459 Other low back pain: Secondary | ICD-10-CM | POA: Diagnosis not present

## 2022-05-02 DIAGNOSIS — R293 Abnormal posture: Secondary | ICD-10-CM | POA: Insufficient documentation

## 2022-05-02 DIAGNOSIS — M6281 Muscle weakness (generalized): Secondary | ICD-10-CM | POA: Insufficient documentation

## 2022-05-02 DIAGNOSIS — M47816 Spondylosis without myelopathy or radiculopathy, lumbar region: Secondary | ICD-10-CM | POA: Diagnosis not present

## 2022-05-02 DIAGNOSIS — F1010 Alcohol abuse, uncomplicated: Secondary | ICD-10-CM | POA: Diagnosis present

## 2022-05-02 DIAGNOSIS — G8929 Other chronic pain: Secondary | ICD-10-CM | POA: Diagnosis not present

## 2022-05-02 DIAGNOSIS — M545 Low back pain, unspecified: Secondary | ICD-10-CM | POA: Diagnosis not present

## 2022-05-02 DIAGNOSIS — G894 Chronic pain syndrome: Secondary | ICD-10-CM | POA: Diagnosis not present

## 2022-05-02 DIAGNOSIS — M7918 Myalgia, other site: Secondary | ICD-10-CM | POA: Insufficient documentation

## 2022-05-02 DIAGNOSIS — F1990 Other psychoactive substance use, unspecified, uncomplicated: Secondary | ICD-10-CM | POA: Diagnosis present

## 2022-05-02 MED ORDER — HYDROCODONE-ACETAMINOPHEN 5-325 MG PO TABS: 0.5000 | ORAL_TABLET | Freq: Three times a day (TID) | ORAL | 0 refills | Status: AC | PRN

## 2022-05-02 NOTE — Therapy (Signed)
OUTPATIENT PHYSICAL THERAPY THORACOLUMBAR EVALUATION   Patient Name: Nicholas Yates MRN: [id number] DOB:05-Sep-1955, 67 y.o., male Today's Date: 05/02/2022  END OF SESSION:  PT End of Session - 05/02/22 1407     Visit Number 1    Date for PT Re-Evaluation 07/11/22    Authorization Type [employer] Medicare    PT Start Time [id number]    PT Stop Time 1445    PT Time Calculation (min) 38 min    Activity Tolerance Patient tolerated treatment well    Behavior During Therapy WFL for tasks assessed/performed             Past Medical History:  Diagnosis Date   Depression    GERD (gastroesophageal reflux disease)    had duodenal ulcer 5 months   Post traumatic stress disorder    Past Surgical History:  Procedure Laterality Date   BIOPSY  02/13/2022   Procedure: BIOPSY;  Surgeon: Irving Copas., MD;  Location: [facility];  Service: Gastroenterology;;   ESOPHAGOGASTRODUODENOSCOPY (EGD) WITH PROPOFOL N/A 02/13/2022   Procedure: ESOPHAGOGASTRODUODENOSCOPY (EGD) WITH PROPOFOL;  Surgeon: Irving Copas., MD;  Location: [facility];  Service: Gastroenterology;  Laterality: N/A;   HERNIA REPAIR     TOTAL HIP ARTHROPLASTY     bilateral   Patient Active Problem List   Diagnosis Date Noted   Chronic pain syndrome 05/02/2022   Acquired leg length discrepancy 05/02/2022   Bilateral low back pain without sciatica 05/02/2022   Facet arthritis of lumbar region 05/02/2022   Myofascial pain 05/02/2022   History of anemia 03/31/2022   Dark stools 03/31/2022   Excessive use of nonsteroidal anti-inflammatory drug (NSAID) 03/31/2022   Bilious vomiting with nausea 03/31/2022   Gastric ulcer 02/14/2022   GIB (gastrointestinal bleeding) 02/12/2022   ETOH abuse 02/12/2022   Symptomatic anemia 02/12/2022   Chronic back pain 02/12/2022   Dyslipidemia 04/29/2021   Extrapyramidal symptom 10/04/2018   Bipolar 2 disorder ([place]) 08/31/2018   ED (erectile dysfunction) 08/31/2018   Generalized  anxiety disorder 01/20/2017   Depression, recurrent ([place]) 07/07/2016   PTSD (post-traumatic stress disorder) 07/07/2016   Duodenal ulcer 07/07/2016    PCP: Caleen Jobs  REFERRING PROVIDER: Caleen Jobs  REFERRING DIAG: M47.9 OA of lower back  Rationale for Evaluation and Treatment: Rehabilitation  THERAPY DIAG:  Chronic midline low back pain without sciatica  Other low back pain  Abnormal posture  Muscle weakness (generalized)  ONSET DATE: 02/07/22  SUBJECTIVE:  SUBJECTIVE STATEMENT: I got 2 fractures in my back last February, one of them is healed. I have fallen because my left leg is shorter than my right and when I don't wear shoes I fall. In 2 weeks they will do shots in my back.   PERTINENT HISTORY:  THA Bilaterally  PAIN:  Are you having pain? Yes: NPRS scale: 7/10 Pain location: all the way across my back below my waist Pain description: sharp, shooting, constant  Aggravating factors: lifting boxes at work, bending Relieving factors: drink, take a lot of "goodies", Tylenol  PRECAUTIONS: None  WEIGHT BEARING RESTRICTIONS: No  FALLS:  Has patient fallen in last 6 months? Yes. Number of falls 4 or 5  LIVING ENVIRONMENT: Lives with: lives with an adult companion Lives in: House/apartment Stairs: Yes: Internal: 14 steps; can reach both Has following equipment at home: None  OCCUPATION: work at [employer] in [place]. Lifting anywhere between 50-80lbs from overhead  PLOF: Independent  PATIENT GOALS: "I am following suggestions that with PT and pain management, so my goal is to appreciate the suggestions and give it shot"    OBJECTIVE:   DIAGNOSTIC FINDINGS:  Segmentation:  5 lumbar type vertebrae   Alignment:  Slight retrolisthesis at L5-S1, L2-3, and L1-2.    Vertebrae: Marrow edema in the lower half of the L5 body where there is a hypointense fracture plane best seen right parasagittal following the inferior endplate, with mild inferior endplate depression. No visible bone lesion. Remote superior endplate fracture of L1 and inferior endplate fracture of 624THL.   Conus medullaris and cauda equina: Conus extends to the L1 level. Conus and cauda equina appear normal.   Paraspinal and other soft tissues: Negative for perispinal mass or inflammation   Disc levels:   T12- L1: Probable intervertebral ankylosis related to remote L1 superior endplate fracture   075-GRM: Disc narrowing and endplate degeneration with disc bulging. Mild facet spurring   L2-L3: Disc narrowing and endplate degeneration with bulging into the inferior foramina. Asymmetric left facet spurring. Mild left foraminal narrowing   L3-L4: Disc narrowing and bulging. Facet spurring and ligamentum flavum thickening. Patent canal and foramina   L4-L5: Disc narrowing and bulging. Degenerative facet spurring and ligamentous thickening. Noncompressive thecal sac narrowing. Patent foramina   L5-S1:Disc narrowing and bulging with degenerative facet spurring. Mild bilateral foraminal narrowing   IMPRESSION: 1. Acute or subacute L5 inferior endplate fracture with mild depression. 2. Lumbar spine degeneration as described.   SCREENING FOR RED FLAGS: Bowel or bladder incontinence: No Spinal tumors: No Cauda equina syndrome: No Compression fracture: No Abdominal aneurysm: No  COGNITION: Overall cognitive status: Within functional limits for tasks assessed     SENSATION: WFL  MUSCLE LENGTH: Hamstrings:very tight bilateral HS  POSTURE: rounded shoulders and forward head  PALPATION: TTP lumbar spine L1-L5  LUMBAR ROM:   AROM eval  Flexion Mid shin with pain  Extension 20% very limited and painful  Right lateral flexion Mod tightness  Left lateral flexion Mod  tightness  Right rotation WNL  Left rotation WNL   (Blank rows = not tested)  LOWER EXTREMITY ROM:  grossly WFL limited in hip flexion and IR/ER- he is very tight   LOWER EXTREMITY MMT:    MMT Right eval Left eval  Hip flexion 3+ 3+  Hip extension    Hip abduction    Hip adduction    Hip internal rotation 4+ 4+  Hip external rotation 4+ 4+  Knee flexion 5  5  Knee extension 4+ 4+  Ankle dorsiflexion    Ankle plantarflexion    Ankle inversion    Ankle eversion     (Blank rows = not tested)  LUMBAR SPECIAL TESTS:  Straight leg raise test: Positive and FABER test: Positive  FUNCTIONAL TESTS:  5 times sit to stand: 11.85s   TODAY'S TREATMENT:                                                                                                                              DATE: 05/02/22- EVAL    PATIENT EDUCATION:  Education details: HEP and POC Person educated: Patient Education method: Explanation Education comprehension: verbalized understanding  HOME EXERCISE PROGRAM: Access Code: [email or web address] URL: [email or web address] Date: 05/02/2022 Prepared by: Andris Baumann  Exercises - Supine Bridge  - 1 x daily - 7 x weekly - 6 sets - 10 reps - Seated Piriformis Stretch with Trunk Bend  - 2 x daily - 7 x weekly - 15-30 hold - Seated Hamstring Stretch  - 2 x daily - 7 x weekly - 2 sets - 10 reps - Supine Lower Trunk Rotation  - 1 x daily - 7 x weekly - 2 sets - 10 reps  ASSESSMENT:  CLINICAL IMPRESSION: Patient is a 67 y.o. male who was seen today for physical therapy evaluation and treatment for chronic LBP. He suffered a compression fracture last year and still has pain from it. Patient presents with very tight hips especially with internal and external rotation. He also has tightness in bilateral hamstrings. He works at [employer] in [place] and has to lift anywhere between 50-80lbs from overhead boxes down to the floor. He reports this increases his back  pain. Patient will benefit from skilled PT to address his LBP, and work on his mobility and flexibility. He lives in [place], [place] and would like to be transferred there as it is a lot closer to his home than [place].  OBJECTIVE IMPAIRMENTS: decreased mobility, decreased ROM, decreased strength, and pain.   REHAB POTENTIAL: Good  CLINICAL DECISION MAKING: Stable/uncomplicated  EVALUATION COMPLEXITY: Low  GOALS: Goals reviewed with patient? Yes  SHORT TERM GOALS: Target date: 06/06/22  Patient will be independent with initial HEP.  Goal status: INITIAL  LONG TERM GOALS: Target date: 07/11/22  Patient will be independent with advanced/ongoing HEP to improve outcomes and carryover.  Goal status: INITIAL  2.  Patient will report 75% improvement in low back pain to improve QOL.  Baseline: 7/10 Goal status: INITIAL  3.  Patient will demonstrate full pain free lumbar ROM to perform ADLs and work tasks.   Goal status: INITIAL  4.  Patient will tolerate 10 min of walking to be able to walk his dogs up and down his drive way without pain. Baseline: pain when walking any distance Goal status: INITIAL  PLAN:  PT FREQUENCY: 1-2x/week  PT DURATION: 10 weeks  PLANNED INTERVENTIONS: Therapeutic exercises, Therapeutic activity,  Neuromuscular re-education, Balance training, Gait training, Patient/Family education, Self Care, Joint mobilization, Stair training, Dry Needling, Electrical stimulation, Cryotherapy, Moist heat, Traction, Ionotophoresis 53m/ml Dexamethasone, and Manual therapy.  PLAN FOR NEXT SESSION: mobility and flexibility exercises for low back   MSame [facility] PT 05/02/2022, 3:30 PM

## 2022-05-02 NOTE — Patient Instructions (Signed)
I have prescribed you Norco 5 mg #30 tablets for 2 weeks. Take 0.5-1 tab every 8 hours for moderate to severe pain. For mild pain, you can take Tylenol 500 mg up to three times daily; do not exceed this dose. STOP all Goody powder/NSAIDs.  In 2 weeks, follow up for trigger point injections and a urine drug screen.  I have given you a script for a heel lift; please bring this to [facility]  Please attend PT

## 2022-05-03 NOTE — Assessment & Plan Note (Signed)
D/t prior L hip replacement  Script for custom heel lift given to be filled at [facility]; advised patient to get this so he can use it with therapies to improve his gait mechanics

## 2022-05-03 NOTE — Assessment & Plan Note (Signed)
STOP goody powder with initiation of Norco script; patient in agreement

## 2022-05-03 NOTE — Assessment & Plan Note (Signed)
Follow up in 2 weeks for trigger point injections

## 2022-05-03 NOTE — Assessment & Plan Note (Signed)
Likely etiology of back pain based on exam findings  MRI 04/2021 showing mild endplate fractures of L5, T11 and L1; L1 ankylosis;  mild retrolisthesis L5/S1, L2/3, and L1/2; and multilevel bulging discs and facet spurring with notable neuroforaminal narrowing at L L2/3 and Bilateral L5/S1.   Pain medication started as above.   Patient has current order for PT. He will go through this and, if no improvement at follow ups, we can discuss referral for MBB

## 2022-05-03 NOTE — Assessment & Plan Note (Addendum)
Indication for chronic opioid: Chronic low back pain Medication and dose: Oxycodone-acetaminophen 5 mg  # pills per month: #30 tabs for 2 weeks to start Last UDS date: none-patient recently drank alcohol, will return in 2 weeks for UDS  Opioid Treatment Agreement signed (Y/N): 05/02/22 Opioid Treatment Agreement last reviewed with patient:   NCCSRS/PDMP reviewed this encounter (include red flags): Yes   Management will include: Low Risk (<10 MME) UDS every 6-12 months NCCSR check every visit Follow up Q3M initially, Q6M once established

## 2022-05-03 NOTE — Assessment & Plan Note (Signed)
Patient in process of cutting back gradually from 5-6 beers per day to 1 beer per day. No Hx withdrawal, no current s/e.  Discussed need to abstain from alcohol use with pain contract. Patient will take 2 weeks to wean off 1 beer per day completely, then come back for UDS.

## 2022-05-03 NOTE — Assessment & Plan Note (Addendum)
Based on exam,  likely more facet arthritis etiology, no radicular symptoms on exam despite MRI showing some areas of neuroforaminal stenosis.   I have prescribed you Norco 5 mg #30 tablets for 2 weeks. Take 0.5-1 tab every 8 hours for moderate to severe pain. For mild pain, you can take Tylenol 500 mg up to three times daily; do not exceed this dose.

## 2022-05-05 ENCOUNTER — Ambulatory Visit: Payer: Medicare Other

## 2022-05-05 ENCOUNTER — Ambulatory Visit: Payer: Medicare Other | Admitting: *Deleted

## 2022-05-10 ENCOUNTER — Ambulatory Visit: Payer: Medicare Other | Admitting: *Deleted

## 2022-05-16 ENCOUNTER — Encounter: Payer: Medicare Other | Admitting: Physical Medicine and Rehabilitation

## 2022-05-16 ENCOUNTER — Encounter: Payer: Self-pay | Admitting: Gastroenterology

## 2022-05-24 ENCOUNTER — Encounter: Payer: Medicare Other | Admitting: Gastroenterology

## 2022-05-26 ENCOUNTER — Encounter: Payer: Self-pay | Admitting: Family Medicine

## 2022-05-26 DIAGNOSIS — F411 Generalized anxiety disorder: Secondary | ICD-10-CM

## 2022-05-26 DIAGNOSIS — F339 Major depressive disorder, recurrent, unspecified: Secondary | ICD-10-CM

## 2022-05-26 DIAGNOSIS — F32A Depression, unspecified: Secondary | ICD-10-CM

## 2022-05-26 MED ORDER — VENLAFAXINE HCL ER 75 MG PO CP24: 225.0000 mg | ORAL_CAPSULE | Freq: Every day | ORAL | 0 refills | Status: DC

## 2022-05-26 MED ORDER — PANTOPRAZOLE SODIUM 40 MG PO TBEC: 40.0000 mg | DELAYED_RELEASE_TABLET | Freq: Two times a day (BID) | ORAL | 0 refills | Status: DC

## 2022-05-26 MED ORDER — SUCRALFATE 1 G PO TABS: 1.0000 g | ORAL_TABLET | Freq: Four times a day (QID) | ORAL | 0 refills | Status: DC

## 2022-06-28 ENCOUNTER — Telehealth: Payer: Self-pay | Admitting: Family Medicine

## 2022-06-28 NOTE — Telephone Encounter (Signed)
Copied from CRM [id number]. Topic: Medicare AWV >> Jun 28, 2022 11:28 AM Payton Doughty wrote: Reason for CRM: Called patient to schedule Medicare Annual Wellness Visit (AWV). Left message for patient to call back and schedule Medicare Annual Wellness Visit (AWV).  Last date of AWV: NONE  Please schedule an appointment at any time with Donne Anon, CMA  .  If any questions, please contact me.  Thank you ,  Verlee Rossetti; Care Guide Ambulatory Clinical Support [place] [facility] Direct Dial: [phone]

## 2022-06-30 ENCOUNTER — Other Ambulatory Visit: Payer: Self-pay | Admitting: Family Medicine

## 2022-06-30 DIAGNOSIS — F411 Generalized anxiety disorder: Secondary | ICD-10-CM

## 2022-06-30 DIAGNOSIS — F32A Depression, unspecified: Secondary | ICD-10-CM

## 2022-06-30 DIAGNOSIS — F339 Major depressive disorder, recurrent, unspecified: Secondary | ICD-10-CM

## 2022-07-13 ENCOUNTER — Encounter: Payer: Self-pay | Admitting: Family Medicine

## 2022-07-13 ENCOUNTER — Ambulatory Visit (INDEPENDENT_AMBULATORY_CARE_PROVIDER_SITE_OTHER): Payer: Medicare Other | Admitting: Family Medicine

## 2022-07-13 VITALS — BP 141/85 | HR 82 | Ht 72.0 in | Wt 184.0 lb

## 2022-07-13 DIAGNOSIS — N529 Male erectile dysfunction, unspecified: Secondary | ICD-10-CM

## 2022-07-13 DIAGNOSIS — D649 Anemia, unspecified: Secondary | ICD-10-CM | POA: Diagnosis not present

## 2022-07-13 DIAGNOSIS — R030 Elevated blood-pressure reading, without diagnosis of hypertension: Secondary | ICD-10-CM

## 2022-07-13 DIAGNOSIS — M545 Low back pain, unspecified: Secondary | ICD-10-CM

## 2022-07-13 DIAGNOSIS — G8929 Other chronic pain: Secondary | ICD-10-CM

## 2022-07-13 MED ORDER — CYCLOBENZAPRINE HCL 7.5 MG PO TABS: 7.5000 mg | ORAL_TABLET | Freq: Three times a day (TID) | ORAL | 2 refills | Status: DC | PRN

## 2022-07-13 MED ORDER — TADALAFIL 10 MG PO TABS: 10.0000 mg | ORAL_TABLET | Freq: Every day | ORAL | 0 refills | Status: DC | PRN

## 2022-07-13 NOTE — Patient Instructions (Signed)
Refilling Flexeril - we discussed safety. Use sparingly.  Referral to PT.

## 2022-07-13 NOTE — Assessment & Plan Note (Signed)
Trial of Flexeril again - discussed safety and monitoring.  Referral to PT. Discussed supportive measures.

## 2022-07-13 NOTE — Assessment & Plan Note (Signed)
Refilled Cialis - reminded him that he should take no more than 2 tablets in 24 hours max. He does not feel like that will be helpful. Referral to urology.

## 2022-07-13 NOTE — Progress Notes (Signed)
Acute Office Visit  Subjective:     Patient ID: Nicholas Yates, male    DOB: Dec 04, 1955, 67 y.o.   MRN: [id number]  Chief Complaint  Patient presents with   Medical Management of Chronic Issues    HPI Patient is in today for chronic back pain.   Patient has had years of back pain (fractures, kyphoplasty). He established with pain management, but patient admitted that he used the Norco they gave him within 2 weeks and could tell he was going to abuse it, so he stopped cold-turkey and decided not to follow-up with pain management anymore. He would like to try the Flexeril again and see if adding physical therapy will help him with pain control. Reports his low back pain is typically 5-7/10. He gets some improvement with a topical analgesic.  Patient also reports that he had been feeling mildly weak/fatigued a few weeks ago - states it felt similar to when he was anemia and had to be admitted for GI bleed. States he had stopped taking iron supplement, but just recently resumed and has since been feeling better. He would like me to recheck his hemoglobin.     ROS All review of systems negative except what is listed in the HPI      Objective:    BP (!) 141/85   Pulse 82   Ht 6' (1.829 m)   Wt 184 lb (83.5 kg)   SpO2 99%   BMI 24.95 kg/m    Physical Exam Vitals reviewed.  Constitutional:      General: He is not in acute distress.    Appearance: Normal appearance. He is not ill-appearing.  Cardiovascular:     Rate and Rhythm: Normal rate and regular rhythm.  Pulmonary:     Effort: Pulmonary effort is normal.     Breath sounds: Normal breath sounds.  Musculoskeletal:        General: No swelling or tenderness. Normal range of motion.     Cervical back: Normal range of motion and neck supple.  Skin:    General: Skin is warm and dry.  Neurological:     General: No focal deficit present.     Mental Status: He is alert and oriented to person, place, and time. Mental status  is at baseline.  Psychiatric:        Mood and Affect: Mood normal.        Behavior: Behavior normal.        Thought Content: Thought content normal.        Judgment: Judgment normal.     No results found for any visits on 07/13/22.      Assessment & Plan:   Problem List Items Addressed This Visit     ED (erectile dysfunction)    Refilled Cialis - reminded him that he should take no more than 2 tablets in 24 hours max. He does not feel like that will be helpful. Referral to urology.       Relevant Medications   tadalafil (CIALIS) 10 MG tablet   Other Relevant Orders   Ambulatory referral to Urology   Chronic back pain - Primary    Trial of Flexeril again - discussed safety and monitoring.  Referral to PT. Discussed supportive measures.       Relevant Medications   cyclobenzaprine (FEXMID) 7.5 MG tablet   Other Relevant Orders   Ambulatory referral to Physical Therapy   Other Visit Diagnoses     Anemia, unspecified type  Labs today  No acute symptoms today    Relevant Orders   CBC with Differential/Platelet   Comprehensive metabolic panel   Elevated blood pressure reading     Advised monitoring at home.  Return in 2 weeks for recheck, nurse visit.  Discussed lifestyle measures.        Meds ordered this encounter  Medications   cyclobenzaprine (FEXMID) 7.5 MG tablet    Sig: Take 1 tablet (7.5 mg total) by mouth 3 (three) times daily as needed for muscle spasms.    Dispense:  30 tablet    Refill:  2    Order Specific Question:   Supervising Provider    Answer:   Danise Edge A [[id number]]   tadalafil (CIALIS) 10 MG tablet    Sig: Take 1 tablet (10 mg total) by mouth daily as needed for erectile dysfunction.    Dispense:  30 tablet    Refill:  0    Order Specific Question:   Supervising Provider    Answer:   Danise Edge A [[id number]]    Return in about 2 weeks (around 07/27/2022) for BP check with nurse.  [name], NP

## 2022-07-14 ENCOUNTER — Telehealth: Payer: Self-pay | Admitting: *Deleted

## 2022-07-14 LAB — CBC WITH DIFFERENTIAL/PLATELET
Basophils Absolute: 0.1 10*3/uL (ref 0.0–0.1)
Basophils Relative: 1.5 % (ref 0.0–3.0)
Eosinophils Absolute: 0.1 10*3/uL (ref 0.0–0.7)
Eosinophils Relative: 1.3 % (ref 0.0–5.0)
HCT: 39.3 % (ref 39.0–52.0)
Hemoglobin: 13.4 g/dL (ref 13.0–17.0)
Lymphocytes Relative: 28.4 % (ref 12.0–46.0)
Lymphs Abs: 1.6 10*3/uL (ref 0.7–4.0)
MCHC: 34 g/dL (ref 30.0–36.0)
MCV: 108.5 fl — ABNORMAL HIGH (ref 78.0–100.0)
Monocytes Absolute: 0.7 10*3/uL (ref 0.1–1.0)
Monocytes Relative: 12.1 % — ABNORMAL HIGH (ref 3.0–12.0)
Neutro Abs: 3.2 10*3/uL (ref 1.4–7.7)
Neutrophils Relative %: 56.7 % (ref 43.0–77.0)
Platelets: 385 10*3/uL (ref 150.0–400.0)
RBC: 3.62 Mil/uL — ABNORMAL LOW (ref 4.22–5.81)
RDW: 16.3 % — ABNORMAL HIGH (ref 11.5–15.5)
WBC: 5.6 10*3/uL (ref 4.0–10.5)

## 2022-07-14 LAB — COMPREHENSIVE METABOLIC PANEL
ALT: 9 U/L (ref 0–53)
AST: 18 U/L (ref 0–37)
Albumin: 3.9 g/dL (ref 3.5–5.2)
Alkaline Phosphatase: 97 U/L (ref 39–117)
BUN: 10 mg/dL (ref 6–23)
CO2: 26 mEq/L (ref 19–32)
Calcium: 9.3 mg/dL (ref 8.4–10.5)
Chloride: 102 mEq/L (ref 96–112)
Creatinine, Ser: 0.88 mg/dL (ref 0.40–1.50)
GFR: 88.3 mL/min (ref >60.00)
Glucose, Bld: 48 mg/dL — CL (ref 70–99)
Potassium: 4.6 mEq/L (ref 3.5–5.1)
Sodium: 137 mEq/L (ref 135–145)
Total Bilirubin: 0.4 mg/dL (ref 0.2–1.2)
Total Protein: 7 g/dL (ref 6.0–8.3)

## 2022-07-14 NOTE — Telephone Encounter (Signed)
Left message on machine for patient to call back with status.

## 2022-07-14 NOTE — Telephone Encounter (Signed)
CRITICAL VALUE STICKER  CRITICAL VALUE:  glucose 48  (drawn yesterday)  MESSENGER (representative from lab): Hope

## 2022-07-15 NOTE — Telephone Encounter (Signed)
Left another message for patient to call our office back.

## 2022-07-19 ENCOUNTER — Ambulatory Visit: Payer: Medicare Other | Admitting: Physical Therapy

## 2022-07-20 NOTE — Telephone Encounter (Signed)
Still unable to reach patient.  Letter mailed.

## 2022-07-30 DIAGNOSIS — F332 Major depressive disorder, recurrent severe without psychotic features: Secondary | ICD-10-CM | POA: Insufficient documentation

## 2022-08-05 ENCOUNTER — Inpatient Hospital Stay: Payer: Medicare Other | Admitting: Family Medicine

## 2022-08-10 ENCOUNTER — Other Ambulatory Visit: Payer: Self-pay

## 2022-08-10 ENCOUNTER — Ambulatory Visit: Payer: Medicare Other | Attending: Family Medicine | Admitting: Physical Therapy

## 2022-08-10 ENCOUNTER — Encounter: Payer: Self-pay | Admitting: Physical Therapy

## 2022-08-10 DIAGNOSIS — G8929 Other chronic pain: Secondary | ICD-10-CM | POA: Diagnosis not present

## 2022-08-10 DIAGNOSIS — M545 Low back pain, unspecified: Secondary | ICD-10-CM | POA: Diagnosis not present

## 2022-08-10 DIAGNOSIS — M6281 Muscle weakness (generalized): Secondary | ICD-10-CM | POA: Insufficient documentation

## 2022-08-10 DIAGNOSIS — M5459 Other low back pain: Secondary | ICD-10-CM | POA: Diagnosis present

## 2022-08-10 DIAGNOSIS — R293 Abnormal posture: Secondary | ICD-10-CM | POA: Insufficient documentation

## 2022-08-10 DIAGNOSIS — R2681 Unsteadiness on feet: Secondary | ICD-10-CM | POA: Insufficient documentation

## 2022-08-10 NOTE — Therapy (Signed)
OUTPATIENT PHYSICAL THERAPY THORACOLUMBAR EVALUATION   Patient Name: Nicholas Yates MRN: [id number] DOB:Nov 20, 1955, 67 y.o., male Today's Date: 08/10/2022  END OF SESSION:  PT End of Session - 08/10/22 1219     Visit Number 1    Number of Visits 13    Date for PT Re-Evaluation 09/21/22    Authorization Type [employer] Medicare    Authorization Time Period 08/10/22 to 09/21/22    Progress Note Due on Visit 10    PT Start Time 1127   pt late   PT Stop Time 1156    PT Time Calculation (min) 29 min    Activity Tolerance Patient tolerated treatment well    Behavior During Therapy [facility] for tasks assessed/performed             Past Medical History:  Diagnosis Date   Depression    GERD (gastroesophageal reflux disease)    had duodenal ulcer 5 months   Post traumatic stress disorder    Past Surgical History:  Procedure Laterality Date   BIOPSY  02/13/2022   Procedure: BIOPSY;  Surgeon: Lemar Lofty., MD;  Location: [facility];  Service: Gastroenterology;;   ESOPHAGOGASTRODUODENOSCOPY (EGD) WITH PROPOFOL N/A 02/13/2022   Procedure: ESOPHAGOGASTRODUODENOSCOPY (EGD) WITH PROPOFOL;  Surgeon: Lemar Lofty., MD;  Location: [facility];  Service: Gastroenterology;  Laterality: N/A;   HERNIA REPAIR     TOTAL HIP ARTHROPLASTY     bilateral   Patient Active Problem List   Diagnosis Date Noted   Chronic pain syndrome 05/02/2022   Acquired leg length discrepancy 05/02/2022   Bilateral low back pain without sciatica 05/02/2022   Facet arthritis of lumbar region 05/02/2022   Myofascial pain 05/02/2022   History of anemia 03/31/2022   Dark stools 03/31/2022   Excessive use of nonsteroidal anti-inflammatory drug (NSAID) 03/31/2022   Bilious vomiting with nausea 03/31/2022   Gastric ulcer 02/14/2022   GIB (gastrointestinal bleeding) 02/12/2022   ETOH abuse 02/12/2022   Symptomatic anemia 02/12/2022   Chronic back pain 02/12/2022   Dyslipidemia 04/29/2021    Extrapyramidal symptom 10/04/2018   Bipolar 2 disorder (HCC) 08/31/2018   ED (erectile dysfunction) 08/31/2018   Generalized anxiety disorder 01/20/2017   Depression, recurrent (HCC) 07/07/2016   PTSD (post-traumatic stress disorder) 07/07/2016   Duodenal ulcer 07/07/2016    PCP: [name], NP  REFERRING PROVIDER: [name], NP  REFERRING DIAG: M54.50,G89.29 (ICD-10-CM) - Chronic bilateral low back pain without sciatica  Rationale for Evaluation and Treatment: Rehabilitation  THERAPY DIAG:  Other low back pain  Muscle weakness (generalized)  Abnormal posture  Unsteadiness on feet  ONSET DATE: 8 years ago  SUBJECTIVE:  SUBJECTIVE STATEMENT: I came here one time before, they just put me on that machine that tugs on you and it didn't do anything I got discouraged. I have 2 breaks in my back. I suffer from chronic back pain and issues with flexibility and all that stuff. No numbness in my legs, no sudden wt loss, no sleep issues, no bowel/bladder incontinence. Have to stay on my side when I sleep and move back and forth bc of the discomfort.   PERTINENT HISTORY:  PTSD, hernia repair, THA B   PAIN:  Are you having pain? Yes: NPRS scale: 7/10 Pain location: B low back  Pain description: sharp, stabbing pain Aggravating factors: nothing  Relieving factors: tylenol   PRECAUTIONS: None  WEIGHT BEARING RESTRICTIONS: No  FALLS:  Has patient fallen in last 6 months? Yes. Number of falls 4 "one leg is a lot shorter than the other" , no FOF   LIVING ENVIRONMENT: Lives with: lives with their spouse Lives in: House/apartment Stairs: 1 STE, has upstairs that he goes to from time to time with rails  Has following equipment at home: None  OCCUPATION: retired   PLOF: Independent,  Independent with basic ADLs, Independent with gait, and Independent with transfers  PATIENT GOALS: improve all aspects of mobility and pain   NEXT MD VISIT: "Not seeing [name] again, seeing a new MD, forgot his name" [name] May 28th per EPIC, pt reports this will new PCP   OBJECTIVE:   DIAGNOSTIC FINDINGS:  CLINICAL DATA:  Bilateral hip pain following multiple falls.   EXAM: DG HIP (WITH OR WITHOUT PELVIS) 3-4V BILAT   COMPARISON:  06/09/2021   FINDINGS: Again demonstrated are bilateral total hip prostheses in satisfactory position and alignment. Interval L5 vertebral compression deformity with kyphoplasty material. Diffuse osteopenia. No fracture or dislocation seen.   IMPRESSION: 1. No fracture or dislocation. 2. Bilateral total hip prostheses.      PATIENT SURVEYS:  FOTO will do 2nd session  SCREENING FOR RED FLAGS: Bowel or bladder incontinence: No Spinal tumors: No Cauda equina syndrome: No Compression fracture: No Abdominal aneurysm: No  COGNITION: Overall cognitive status: Within functional limits for tasks assessed     SENSATION: Not tested  MUSCLE LENGTH:  Hip flexors moderate limitation B Quads severe limitation B HS groups moderate limitation B Piriformis severe limitation B   POSTURE: rounded shoulders, forward head, decreased lumbar lordosis, increased thoracic kyphosis, and flexed trunk     LUMBAR ROM:   AROM eval  Flexion Moderate limitation, improvement in pain   Extension Severe limitation  Right lateral flexion Moderate limitation   Left lateral flexion Moderate limitation   Right rotation Moderate limitation   Left rotation Moderate limitation    (Blank rows = not tested)    LOWER EXTREMITY MMT:    MMT Right eval Left eval  Hip flexion 4 4  Hip extension 2 2  Hip abduction 4 4  Hip adduction    Hip internal rotation    Hip external rotation    Knee flexion 4+ 4-  Knee extension 4 4  Ankle dorsiflexion 4+ 4   Ankle plantarflexion    Ankle inversion    Ankle eversion     (Blank rows = not tested)     TODAY'S TREATMENT:  DATE:   Eval  Objective measures, appropriate education, care planning   TherEx  SKTC 5x3 seconds B  Lumbar rotation stretch 5x3 seconds B Bridges x10    PATIENT EDUCATION:  Education details: exam findings, POC, HEP  Person educated: Patient Education method: Explanation, Demonstration, and Handouts Education comprehension: verbalized understanding, returned demonstration, and needs further education  HOME EXERCISE PROGRAM: Access Code: [email or web address] URL: [email or web address] Date: 08/10/2022 Prepared by: Nedra Hai  Exercises - Single Knee to Chest Stretch  - 2 x daily - 7 x weekly - 1 sets - 10 reps - 3 hold - Supine Lower Trunk Rotation  - 2 x daily - 7 x weekly - 1 sets - 10 reps - 5 hold - Supine Bridge  - 2 x daily - 7 x weekly - 1 sets - 10 reps - 2 hold  ASSESSMENT:  CLINICAL IMPRESSION: Patient is a 67 y.o. M who was seen today for physical therapy evaluation and treatment for chronic low back pain. Of note ,he was a bit late for evaluation, skipped FOTO today to focus on evaluation.  Exam reveals general postural impairments, limited mobility in hips and lumbar spine, functional muscle weakness, and limited flexibility. Did well with HEP exercises, will benefit from skilled PT services to address all impairments, reduce pain, and optimize function moving forward.   OBJECTIVE IMPAIRMENTS: Abnormal gait, decreased mobility, difficulty walking, decreased ROM, decreased strength, increased fascial restrictions, increased muscle spasms, impaired flexibility, improper body mechanics, postural dysfunction, and pain.   ACTIVITY LIMITATIONS: carrying, lifting, sitting, standing, stairs, transfers, and locomotion  level  PARTICIPATION LIMITATIONS: driving, shopping, community activity, and yard work  PERSONAL FACTORS: Age, Behavior pattern, Education, Fitness, Past/current experiences, and Time since onset of injury/illness/exacerbation are also affecting patient's functional outcome.   REHAB POTENTIAL: Good  CLINICAL DECISION MAKING: Stable/uncomplicated  EVALUATION COMPLEXITY: Low   GOALS: Goals reviewed with patient? Yes  SHORT TERM GOALS: Target date: 08/31/2022    Will be compliant with appropriate progressive HEP  Baseline: Goal status: INITIAL  2.  Lumbar ROM to have improved by 50% all planes of motion  Baseline:  Goal status: INITIAL  3.  LE muscle flexibility to have improved by 50%  Baseline:  Goal status: INITIAL  4.  Will demonstrate good biomechanics for bed mobility and floor to waist height lifting  Baseline:  Goal status: INITIAL    LONG TERM GOALS: Target date: 09/21/2022    MMT to have improved by one grade in all weak groups and will score at least 20/24 on DGI Baseline:  Goal status: INITIAL  2.  Pain to be no more than 3/10 at worst  Baseline:  Goal status: INITIAL  3.  Will be able to return to all yardwork and work around the house without increase in pain  Baseline:  Goal status: INITIAL  4.  FOTO score to have improved by at least 10 points to show subjective improvement  Baseline:  Goal status: INITIAL    PLAN:  PT FREQUENCY: 2x/week  PT DURATION: 6 weeks  PLANNED INTERVENTIONS: Therapeutic exercises, Therapeutic activity, Balance training, Gait training, Patient/Family education, Self Care, and Joint mobilization.  PLAN FOR NEXT SESSION: No traction (not appropriate clinically and per pt request); needs FOTO and DGI. Otherwise work on lumbar ROM, LE flexibility, general LE and core strenth, biomechanics   Nedra Hai PT DPT PN2

## 2022-08-16 ENCOUNTER — Ambulatory Visit: Payer: Medicare Other | Admitting: Internal Medicine

## 2022-08-17 ENCOUNTER — Ambulatory Visit: Payer: Medicare Other

## 2022-08-21 ENCOUNTER — Other Ambulatory Visit: Payer: Self-pay | Admitting: Family Medicine

## 2022-08-24 ENCOUNTER — Ambulatory Visit: Payer: Medicare Other | Attending: Family Medicine

## 2022-08-24 DIAGNOSIS — R293 Abnormal posture: Secondary | ICD-10-CM | POA: Diagnosis present

## 2022-08-24 DIAGNOSIS — M6281 Muscle weakness (generalized): Secondary | ICD-10-CM | POA: Diagnosis present

## 2022-08-24 DIAGNOSIS — G8929 Other chronic pain: Secondary | ICD-10-CM | POA: Insufficient documentation

## 2022-08-24 DIAGNOSIS — R2681 Unsteadiness on feet: Secondary | ICD-10-CM | POA: Insufficient documentation

## 2022-08-24 DIAGNOSIS — M545 Low back pain, unspecified: Secondary | ICD-10-CM | POA: Diagnosis present

## 2022-08-24 DIAGNOSIS — M5459 Other low back pain: Secondary | ICD-10-CM | POA: Insufficient documentation

## 2022-08-24 NOTE — Therapy (Signed)
OUTPATIENT PHYSICAL THERAPY THORACOLUMBAR EVALUATION   Patient Name: Nicholas Yates MRN: [id number] DOB:[birth year], 67 y.o., male Today's Date: 08/24/2022  END OF SESSION:  PT End of Session - 08/24/22 1311     Visit Number 2    Number of Visits 13    Date for PT Re-Evaluation 09/21/22    Authorization Type [employer] Medicare    Authorization Time Period 08/10/22 to 09/21/22    Progress Note Due on Visit 10    PT Start Time 1310    PT Stop Time 1343    PT Time Calculation (min) 33 min    Activity Tolerance Patient tolerated treatment well    Behavior During Therapy [facility] for tasks assessed/performed             Past Medical History:  Diagnosis Date   Depression    GERD (gastroesophageal reflux disease)    had duodenal ulcer 5 months   Post traumatic stress disorder    Past Surgical History:  Procedure Laterality Date   BIOPSY  02/13/2022   Procedure: BIOPSY;  Surgeon: Lemar Lofty., MD;  Location: [facility];  Service: Gastroenterology;;   ESOPHAGOGASTRODUODENOSCOPY (EGD) WITH PROPOFOL N/A 02/13/2022   Procedure: ESOPHAGOGASTRODUODENOSCOPY (EGD) WITH PROPOFOL;  Surgeon: Lemar Lofty., MD;  Location: [facility];  Service: Gastroenterology;  Laterality: N/A;   HERNIA REPAIR     TOTAL HIP ARTHROPLASTY     bilateral   Patient Active Problem List   Diagnosis Date Noted   Chronic pain syndrome 05/02/2022   Acquired leg length discrepancy 05/02/2022   Bilateral low back pain without sciatica 05/02/2022   Facet arthritis of lumbar region 05/02/2022   Myofascial pain 05/02/2022   History of anemia 03/31/2022   Dark stools 03/31/2022   Excessive use of nonsteroidal anti-inflammatory drug (NSAID) 03/31/2022   Bilious vomiting with nausea 03/31/2022   Gastric ulcer 02/14/2022   GIB (gastrointestinal bleeding) 02/12/2022   ETOH abuse 02/12/2022   Symptomatic anemia 02/12/2022   Chronic back pain 02/12/2022   Dyslipidemia 04/29/2021   Extrapyramidal  symptom 10/04/2018   Bipolar 2 disorder (HCC) 08/31/2018   ED (erectile dysfunction) 08/31/2018   Generalized anxiety disorder 01/20/2017   Depression, recurrent (HCC) 07/07/2016   PTSD (post-traumatic stress disorder) 07/07/2016   Duodenal ulcer 07/07/2016    PCP: [name], NP  REFERRING PROVIDER: [name], NP  REFERRING DIAG: M54.50,G89.29 (ICD-10-CM) - Chronic bilateral low back pain without sciatica  Rationale for Evaluation and Treatment: Rehabilitation  THERAPY DIAG:  Other low back pain  Muscle weakness (generalized)  Abnormal posture  Unsteadiness on feet  Chronic midline low back pain without sciatica  ONSET DATE: 8 years ago  SUBJECTIVE:  SUBJECTIVE STATEMENT: Pt reports minimal back pain today.   PERTINENT HISTORY:  PTSD, hernia repair, THA B   PAIN:  Are you having pain? Yes: NPRS scale: 2/10 Pain location: B low back  Pain description: sharp, stabbing pain Aggravating factors: nothing  Relieving factors: tylenol   PRECAUTIONS: None  WEIGHT BEARING RESTRICTIONS: No  FALLS:  Has patient fallen in last 6 months? Yes. Number of falls 4 "one leg is a lot shorter than the other" , no FOF   LIVING ENVIRONMENT: Lives with: lives with their spouse Lives in: House/apartment Stairs: 1 STE, has upstairs that he goes to from time to time with rails  Has following equipment at home: None  OCCUPATION: retired   PLOF: Independent, Independent with basic ADLs, Independent with gait, and Independent with transfers  PATIENT GOALS: improve all aspects of mobility and pain   NEXT MD VISIT: "Not seeing [name] again, seeing a new MD, forgot his name" [name] May 28th per EPIC, pt reports this will new PCP   OBJECTIVE:   DIAGNOSTIC FINDINGS:  CLINICAL  DATA:  Bilateral hip pain following multiple falls.   EXAM: DG HIP (WITH OR WITHOUT PELVIS) 3-4V BILAT   COMPARISON:  06/09/2021   FINDINGS: Again demonstrated are bilateral total hip prostheses in satisfactory position and alignment. Interval L5 vertebral compression deformity with kyphoplasty material. Diffuse osteopenia. No fracture or dislocation seen.   IMPRESSION: 1. No fracture or dislocation. 2. Bilateral total hip prostheses.      PATIENT SURVEYS:  FOTO will do 2nd session  SCREENING FOR RED FLAGS: Bowel or bladder incontinence: No Spinal tumors: No Cauda equina syndrome: No Compression fracture: No Abdominal aneurysm: No  COGNITION: Overall cognitive status: Within functional limits for tasks assessed     SENSATION: Not tested  MUSCLE LENGTH:  Hip flexors moderate limitation B Quads severe limitation B HS groups moderate limitation B Piriformis severe limitation B   POSTURE: rounded shoulders, forward head, decreased lumbar lordosis, increased thoracic kyphosis, and flexed trunk     LUMBAR ROM:   AROM eval  Flexion Moderate limitation, improvement in pain   Extension Severe limitation  Right lateral flexion Moderate limitation   Left lateral flexion Moderate limitation   Right rotation Moderate limitation   Left rotation Moderate limitation    (Blank rows = not tested)    LOWER EXTREMITY MMT:    MMT Right eval Left eval  Hip flexion 4 4  Hip extension 2 2  Hip abduction 4 4  Hip adduction    Hip internal rotation    Hip external rotation    Knee flexion 4+ 4-  Knee extension 4 4  Ankle dorsiflexion 4+ 4  Ankle plantarflexion    Ankle inversion    Ankle eversion     (Blank rows = not tested)     TODAY'S TREATMENT:  DATE:                                     EXERCISE LOG  Exercise Repetitions and  Resistance Comments  Nustep  Lvl 3 x 15 mins   [employer] 3 mins   [employer] Out 3 mins   LAQs 3# x 20 reps bil   Seated Marches 3# x 20 reps bil   Ham Curls Red x 20 reps bil    Blank cell = exercise not performed today    PATIENT EDUCATION:  Education details: exam findings, POC, HEP  Person educated: Patient Education method: Programmer, multimedia, Demonstration, and Handouts Education comprehension: verbalized understanding, returned demonstration, and needs further education  HOME EXERCISE PROGRAM: Access Code: [email or web address] URL: [email or web address] Date: 08/10/2022 Prepared by: Nedra Hai  Exercises - Single Knee to Chest Stretch  - 2 x daily - 7 x weekly - 1 sets - 10 reps - 3 hold - Supine Lower Trunk Rotation  - 2 x daily - 7 x weekly - 1 sets - 10 reps - 5 hold - Supine Bridge  - 2 x daily - 7 x weekly - 1 sets - 10 reps - 2 hold  ASSESSMENT:  CLINICAL IMPRESSION: Pt arrives for today's treatment session reporting 2/10 low back pain.  Pt arrives for today's treatment session 10 mins late.  Pt scored 58 on initial FOTO performance.  Pt instructed in standing and seated lumbar exercises to increase strength and function.  Pt requiring min cues and demonstration for proper technique and posture.  Pt denied any pain at completion of today's treatment session.   OBJECTIVE IMPAIRMENTS: Abnormal gait, decreased mobility, difficulty walking, decreased ROM, decreased strength, increased fascial restrictions, increased muscle spasms, impaired flexibility, improper body mechanics, postural dysfunction, and pain.   ACTIVITY LIMITATIONS: carrying, lifting, sitting, standing, stairs, transfers, and locomotion level  PARTICIPATION LIMITATIONS: driving, shopping, community activity, and yard work  PERSONAL FACTORS: Age, Behavior pattern, Education, Fitness, Past/current experiences, and Time since onset of injury/illness/exacerbation are also affecting patient's functional  outcome.   REHAB POTENTIAL: Good  CLINICAL DECISION MAKING: Stable/uncomplicated  EVALUATION COMPLEXITY: Low   GOALS: Goals reviewed with patient? Yes  SHORT TERM GOALS: Target date: 08/31/2022    Will be compliant with appropriate progressive HEP  Baseline: Goal status: INITIAL  2.  Lumbar ROM to have improved by 50% all planes of motion  Baseline:  Goal status: INITIAL  3.  LE muscle flexibility to have improved by 50%  Baseline:  Goal status: INITIAL  4.  Will demonstrate good biomechanics for bed mobility and floor to waist height lifting  Baseline:  Goal status: INITIAL    LONG TERM GOALS: Target date: 09/21/2022    MMT to have improved by one grade in all weak groups and will score at least 20/24 on DGI Baseline:  Goal status: INITIAL  2.  Pain to be no more than 3/10 at worst  Baseline:  Goal status: INITIAL  3.  Will be able to return to all yardwork and work around the house without increase in pain  Baseline:  Goal status: INITIAL  4.  FOTO score to have improved by at least 10 points to show subjective improvement  Baseline:  Goal status: INITIAL    PLAN:  PT FREQUENCY: 2x/week  PT DURATION: 6 weeks  PLANNED INTERVENTIONS: Therapeutic exercises, Therapeutic activity, Balance training, Gait training, Patient/Family education, Self Care,  and Joint mobilization.  PLAN FOR NEXT SESSION: No traction (not appropriate clinically and per pt request); needs FOTO and DGI. Otherwise work on lumbar ROM, LE flexibility, general LE and core strenth, biomechanics   Rexene Agent, PTA

## 2022-08-26 ENCOUNTER — Ambulatory Visit: Payer: Medicare Other | Admitting: *Deleted

## 2022-08-26 DIAGNOSIS — R293 Abnormal posture: Secondary | ICD-10-CM

## 2022-08-26 DIAGNOSIS — M5459 Other low back pain: Secondary | ICD-10-CM | POA: Diagnosis not present

## 2022-08-26 DIAGNOSIS — M6281 Muscle weakness (generalized): Secondary | ICD-10-CM

## 2022-08-26 NOTE — Therapy (Signed)
OUTPATIENT PHYSICAL THERAPY THORACOLUMBAR TREATMENT   Patient Name: Nicholas Yates MRN: [id number] DOB:January 30, 1956, 67 y.o., male Today's Date: 08/26/2022  END OF SESSION:  PT End of Session - 08/26/22 1102     Visit Number 3    Number of Visits 13    Date for PT Re-Evaluation 09/21/22    Authorization Type [employer] Medicare    Authorization Time Period 08/10/22 to 09/21/22    Progress Note Due on Visit 10    PT Start Time 1102    PT Stop Time 1152    PT Time Calculation (min) 50 min             Past Medical History:  Diagnosis Date   Depression    GERD (gastroesophageal reflux disease)    had duodenal ulcer 5 months   Post traumatic stress disorder    Past Surgical History:  Procedure Laterality Date   BIOPSY  02/13/2022   Procedure: BIOPSY;  Surgeon: Lemar Lofty., MD;  Location: [facility];  Service: Gastroenterology;;   ESOPHAGOGASTRODUODENOSCOPY (EGD) WITH PROPOFOL N/A 02/13/2022   Procedure: ESOPHAGOGASTRODUODENOSCOPY (EGD) WITH PROPOFOL;  Surgeon: Lemar Lofty., MD;  Location: [facility];  Service: Gastroenterology;  Laterality: N/A;   HERNIA REPAIR     TOTAL HIP ARTHROPLASTY     bilateral   Patient Active Problem List   Diagnosis Date Noted   Chronic pain syndrome 05/02/2022   Acquired leg length discrepancy 05/02/2022   Bilateral low back pain without sciatica 05/02/2022   Facet arthritis of lumbar region 05/02/2022   Myofascial pain 05/02/2022   History of anemia 03/31/2022   Dark stools 03/31/2022   Excessive use of nonsteroidal anti-inflammatory drug (NSAID) 03/31/2022   Bilious vomiting with nausea 03/31/2022   Gastric ulcer 02/14/2022   GIB (gastrointestinal bleeding) 02/12/2022   ETOH abuse 02/12/2022   Symptomatic anemia 02/12/2022   Chronic back pain 02/12/2022   Dyslipidemia 04/29/2021   Extrapyramidal symptom 10/04/2018   Bipolar 2 disorder (HCC) 08/31/2018   ED (erectile dysfunction) 08/31/2018   Generalized anxiety  disorder 01/20/2017   Depression, recurrent (HCC) 07/07/2016   PTSD (post-traumatic stress disorder) 07/07/2016   Duodenal ulcer 07/07/2016    PCP: [name], NP  REFERRING PROVIDER: [name], NP  REFERRING DIAG: M54.50,G89.29 (ICD-10-CM) - Chronic bilateral low back pain without sciatica  Rationale for Evaluation and Treatment: Rehabilitation  THERAPY DIAG:  Other low back pain  Muscle weakness (generalized)  Abnormal posture  ONSET DATE: 8 years ago  SUBJECTIVE:  SUBJECTIVE STATEMENT: Pt reports LBP 5/10   PERTINENT HISTORY:  PTSD, hernia repair, THA B   PAIN:  Are you having pain? Yes: NPRS scale: 5/10 Pain location: B low back  Pain description: sharp, stabbing pain Aggravating factors: nothing  Relieving factors: tylenol   PRECAUTIONS: None  WEIGHT BEARING RESTRICTIONS: No  FALLS:  Has patient fallen in last 6 months? Yes. Number of falls 4 "one leg is a lot shorter than the other" , no FOF   LIVING ENVIRONMENT: Lives with: lives with their spouse Lives in: House/apartment Stairs: 1 STE, has upstairs that he goes to from time to time with rails  Has following equipment at home: None  OCCUPATION: retired   PLOF: Independent, Independent with basic ADLs, Independent with gait, and Independent with transfers  PATIENT GOALS: improve all aspects of mobility and pain   NEXT MD VISIT: "Not seeing [name] again, seeing a new MD, forgot his name" [name] May 28th per [employer], pt reports this will new PCP   OBJECTIVE:   DIAGNOSTIC FINDINGS:  CLINICAL DATA:  Bilateral hip pain following multiple falls.   EXAM: DG HIP (WITH OR WITHOUT PELVIS) 3-4V BILAT   COMPARISON:  06/09/2021   FINDINGS: Again demonstrated are bilateral total hip prostheses  in satisfactory position and alignment. Interval L5 vertebral compression deformity with kyphoplasty material. Diffuse osteopenia. No fracture or dislocation seen.   IMPRESSION: 1. No fracture or dislocation. 2. Bilateral total hip prostheses.      PATIENT SURVEYS:  FOTO will do 2nd session  SCREENING FOR RED FLAGS: Bowel or bladder incontinence: No Spinal tumors: No Cauda equina syndrome: No Compression fracture: No Abdominal aneurysm: No  COGNITION: Overall cognitive status: Within functional limits for tasks assessed     SENSATION: Not tested  MUSCLE LENGTH:  Hip flexors moderate limitation B Quads severe limitation B HS groups moderate limitation B Piriformis severe limitation B   POSTURE: rounded shoulders, forward head, decreased lumbar lordosis, increased thoracic kyphosis, and flexed trunk     LUMBAR ROM:   AROM eval  Flexion Moderate limitation, improvement in pain   Extension Severe limitation  Right lateral flexion Moderate limitation   Left lateral flexion Moderate limitation   Right rotation Moderate limitation   Left rotation Moderate limitation    (Blank rows = not tested)    LOWER EXTREMITY MMT:    MMT Right eval Left eval  Hip flexion 4 4  Hip extension 2 2  Hip abduction 4 4  Hip adduction    Hip internal rotation    Hip external rotation    Knee flexion 4+ 4-  Knee extension 4 4  Ankle dorsiflexion 4+ 4  Ankle plantarflexion    Ankle inversion    Ankle eversion     (Blank rows = not tested)     TODAY'S TREATMENT:  DATE:                                     EXERCISE LOG      08-26-22  Exercise Repetitions and Resistance Comments  Nustep  Lvl 3 x 15 mins .86 miles  Bridging X10 hold 5 secs   Dying bug X6 hold 10 secs Bil   [employer] bird dog X 6 hold 5-10 secs each   [employer] Out     LAQs    Seated Marches    Ham Curls     Blank cell = exercise not performed today  Discussed AB bracing with ADL's   PATIENT EDUCATION:  Education details: exam findings, POC, HEP  Person educated: Patient Education method: Explanation, Demonstration, and Handouts Education comprehension: verbalized understanding, returned demonstration, and needs further education  HOME EXERCISE PROGRAM: Access Code: [email or web address] URL: [email or web address] Date: 08/10/2022 Prepared by: Nedra Hai  Exercises - Single Knee to Chest Stretch  - 2 x daily - 7 x weekly - 1 sets - 10 reps - 3 hold - Supine Lower Trunk Rotation  - 2 x daily - 7 x weekly - 1 sets - 10 reps - 5 hold - Supine Bridge  - 2 x daily - 7 x weekly - 1 sets - 10 reps - 2 hold  ASSESSMENT:  CLINICAL IMPRESSION: Pt arrived today doing fair with LBP. He was able to continue with therex with progression of core strengthening ex's and did very well. Handout given for HEP   OBJECTIVE IMPAIRMENTS: Abnormal gait, decreased mobility, difficulty walking, decreased ROM, decreased strength, increased fascial restrictions, increased muscle spasms, impaired flexibility, improper body mechanics, postural dysfunction, and pain.   ACTIVITY LIMITATIONS: carrying, lifting, sitting, standing, stairs, transfers, and locomotion level  PARTICIPATION LIMITATIONS: driving, shopping, community activity, and yard work  PERSONAL FACTORS: Age, Behavior pattern, Education, Fitness, Past/current experiences, and Time since onset of injury/illness/exacerbation are also affecting patient's functional outcome.   REHAB POTENTIAL: Good  CLINICAL DECISION MAKING: Stable/uncomplicated  EVALUATION COMPLEXITY: Low   GOALS: Goals reviewed with patient? Yes  SHORT TERM GOALS: Target date: 08/31/2022    Will be compliant with appropriate progressive HEP  Baseline: Goal status: INITIAL  2.  Lumbar ROM to have improved by 50% all planes of  motion  Baseline:  Goal status: INITIAL  3.  LE muscle flexibility to have improved by 50%  Baseline:  Goal status: INITIAL  4.  Will demonstrate good biomechanics for bed mobility and floor to waist height lifting  Baseline:  Goal status: INITIAL    LONG TERM GOALS: Target date: 09/21/2022    MMT to have improved by one grade in all weak groups and will score at least 20/24 on DGI Baseline:  Goal status: INITIAL  2.  Pain to be no more than 3/10 at worst  Baseline:  Goal status: INITIAL  3.  Will be able to return to all yardwork and work around the house without increase in pain  Baseline:  Goal status: INITIAL  4.  FOTO score to have improved by at least 10 points to show subjective improvement  Baseline:  Goal status: INITIAL    PLAN:  PT FREQUENCY: 2x/week  PT DURATION: 6 weeks  PLANNED INTERVENTIONS: Therapeutic exercises, Therapeutic activity, Balance training, Gait training, Patient/Family education, Self Care, and Joint mobilization.  PLAN FOR NEXT SESSION: No traction (not appropriate clinically  and per pt request); I. Otherwise work on lumbar ROM, LE flexibility, general LE and core strenth, biomechanics   Rexene Agent, PTA

## 2022-08-29 ENCOUNTER — Ambulatory Visit: Payer: Medicare Other | Admitting: *Deleted

## 2022-09-02 ENCOUNTER — Encounter: Payer: Self-pay | Admitting: *Deleted

## 2022-09-02 ENCOUNTER — Ambulatory Visit: Payer: Medicare Other | Admitting: *Deleted

## 2022-09-02 DIAGNOSIS — R293 Abnormal posture: Secondary | ICD-10-CM

## 2022-09-02 DIAGNOSIS — M5459 Other low back pain: Secondary | ICD-10-CM | POA: Diagnosis not present

## 2022-09-02 DIAGNOSIS — M6281 Muscle weakness (generalized): Secondary | ICD-10-CM

## 2022-09-02 NOTE — Therapy (Signed)
OUTPATIENT PHYSICAL THERAPY THORACOLUMBAR TREATMENT   Patient Name: Nicholas Yates MRN: [id number] DOB:12-14-1955, 67 y.o., male Today's Date: 09/02/2022  END OF SESSION:  PT End of Session - 09/02/22 1102     Visit Number 4    Number of Visits 13    Date for PT Re-Evaluation 09/21/22    Authorization Type [employer] Medicare    Authorization Time Period 08/10/22 to 09/21/22    Progress Note Due on Visit 10    PT Start Time 1100    PT Stop Time 1134    PT Time Calculation (min) 34 min             Past Medical History:  Diagnosis Date   Depression    GERD (gastroesophageal reflux disease)    had duodenal ulcer 5 months   Post traumatic stress disorder    Past Surgical History:  Procedure Laterality Date   BIOPSY  02/13/2022   Procedure: BIOPSY;  Surgeon: Lemar Lofty., MD;  Location: [facility];  Service: Gastroenterology;;   ESOPHAGOGASTRODUODENOSCOPY (EGD) WITH PROPOFOL N/A 02/13/2022   Procedure: ESOPHAGOGASTRODUODENOSCOPY (EGD) WITH PROPOFOL;  Surgeon: Lemar Lofty., MD;  Location: [facility];  Service: Gastroenterology;  Laterality: N/A;   HERNIA REPAIR     TOTAL HIP ARTHROPLASTY     bilateral   Patient Active Problem List   Diagnosis Date Noted   Chronic pain syndrome 05/02/2022   Acquired leg length discrepancy 05/02/2022   Bilateral low back pain without sciatica 05/02/2022   Facet arthritis of lumbar region 05/02/2022   Myofascial pain 05/02/2022   History of anemia 03/31/2022   Dark stools 03/31/2022   Excessive use of nonsteroidal anti-inflammatory drug (NSAID) 03/31/2022   Bilious vomiting with nausea 03/31/2022   Gastric ulcer 02/14/2022   GIB (gastrointestinal bleeding) 02/12/2022   ETOH abuse 02/12/2022   Symptomatic anemia 02/12/2022   Chronic back pain 02/12/2022   Dyslipidemia 04/29/2021   Extrapyramidal symptom 10/04/2018   Bipolar 2 disorder (HCC) 08/31/2018   ED (erectile dysfunction) 08/31/2018   Generalized anxiety  disorder 01/20/2017   Depression, recurrent (HCC) 07/07/2016   PTSD (post-traumatic stress disorder) 07/07/2016   Duodenal ulcer 07/07/2016    PCP: [name], NP  REFERRING PROVIDER: [name], NP  REFERRING DIAG: M54.50,G89.29 (ICD-10-CM) - Chronic bilateral low back pain without sciatica  Rationale for Evaluation and Treatment: Rehabilitation  THERAPY DIAG:  Other low back pain  Muscle weakness (generalized)  Abnormal posture  ONSET DATE: 8 years ago  SUBJECTIVE:  SUBJECTIVE STATEMENT: Pt reports LBP 7/10, but have been doing things around the house. Cut it short today.  PERTINENT HISTORY:  PTSD, hernia repair, THA B   PAIN:  Are you having pain? Yes: NPRS scale: 7/10 Pain location: B low back  Pain description: sharp, stabbing pain Aggravating factors: nothing  Relieving factors: tylenol   PRECAUTIONS: None  WEIGHT BEARING RESTRICTIONS: No  FALLS:  Has patient fallen in last 6 months? Yes. Number of falls 4 "one leg is a lot shorter than the other" , no FOF   LIVING ENVIRONMENT: Lives with: lives with their spouse Lives in: House/apartment Stairs: 1 STE, has upstairs that he goes to from time to time with rails  Has following equipment at home: None  OCCUPATION: retired   PLOF: Independent, Independent with basic ADLs, Independent with gait, and Independent with transfers  PATIENT GOALS: improve all aspects of mobility and pain   NEXT MD VISIT: "Not seeing [name] again, seeing a new MD, forgot his name" [name] May 28th per EPIC, pt reports this will new PCP   OBJECTIVE:   DIAGNOSTIC FINDINGS:  CLINICAL DATA:  Bilateral hip pain following multiple falls.   EXAM: DG HIP (WITH OR WITHOUT PELVIS) 3-4V BILAT   COMPARISON:  06/09/2021    FINDINGS: Again demonstrated are bilateral total hip prostheses in satisfactory position and alignment. Interval L5 vertebral compression deformity with kyphoplasty material. Diffuse osteopenia. No fracture or dislocation seen.   IMPRESSION: 1. No fracture or dislocation. 2. Bilateral total hip prostheses.      PATIENT SURVEYS:  FOTO will do 2nd session  SCREENING FOR RED FLAGS: Bowel or bladder incontinence: No Spinal tumors: No Cauda equina syndrome: No Compression fracture: No Abdominal aneurysm: No  COGNITION: Overall cognitive status: Within functional limits for tasks assessed     SENSATION: Not tested  MUSCLE LENGTH:  Hip flexors moderate limitation B Quads severe limitation B HS groups moderate limitation B Piriformis severe limitation B   POSTURE: rounded shoulders, forward head, decreased lumbar lordosis, increased thoracic kyphosis, and flexed trunk     LUMBAR ROM:   AROM eval  Flexion Moderate limitation, improvement in pain   Extension Severe limitation  Right lateral flexion Moderate limitation   Left lateral flexion Moderate limitation   Right rotation Moderate limitation   Left rotation Moderate limitation    (Blank rows = not tested)    LOWER EXTREMITY MMT:    MMT Right eval Left eval  Hip flexion 4 4  Hip extension 2 2  Hip abduction 4 4  Hip adduction    Hip internal rotation    Hip external rotation    Knee flexion 4+ 4-  Knee extension 4 4  Ankle dorsiflexion 4+ 4  Ankle plantarflexion    Ankle inversion    Ankle eversion     (Blank rows = not tested)     TODAY'S TREATMENT:  DATE:                                     EXERCISE LOG      09-02-22  Exercise Repetitions and Resistance Comments  Nustep  Lvl 3 x 15 mins   Bridging X10 hold 5 secs   [employer] X6 hold 10 secs Bil   Ball Press    XTS  Lat pulldown Blue 2x10   XTS ROWs Blue 2x10   Standing bird dog X 6 hold   10 secs each   [employer] Out    LAQs    Seated Marches    Ham Curls     Blank cell = exercise not performed today  Discussed AB bracing with ADL's   PATIENT EDUCATION:  Education details: exam findings, POC, HEP  Person educated: Patient Education method: Programmer, multimedia, Demonstration, and Handouts Education comprehension: verbalized understanding, returned demonstration, and needs further education  HOME EXERCISE PROGRAM: Access Code: [email or web address] URL: [email or web address] Date: 08/10/2022 Prepared by: Nedra Hai  Exercises - Single Knee to Chest Stretch  - 2 x daily - 7 x weekly - 1 sets - 10 reps - 3 hold - Supine Lower Trunk Rotation  - 2 x daily - 7 x weekly - 1 sets - 10 reps - 5 hold - Supine Bridge  - 2 x daily - 7 x weekly - 1 sets - 10 reps - 2 hold  ASSESSMENT:  CLINICAL IMPRESSION:     Pt arrived with LBP 7/10 due to doing work around the house and asked to cut it short today.  He was able to continue with core activation exs and progress with XTS standing exs. Pt did well with no increased pain and reports performing HEP.   OBJECTIVE IMPAIRMENTS: Abnormal gait, decreased mobility, difficulty walking, decreased ROM, decreased strength, increased fascial restrictions, increased muscle spasms, impaired flexibility, improper body mechanics, postural dysfunction, and pain.   ACTIVITY LIMITATIONS: carrying, lifting, sitting, standing, stairs, transfers, and locomotion level  PARTICIPATION LIMITATIONS: driving, shopping, community activity, and yard work  PERSONAL FACTORS: Age, Behavior pattern, Education, Fitness, Past/current experiences, and Time since onset of injury/illness/exacerbation are also affecting patient's functional outcome.   REHAB POTENTIAL: Good  CLINICAL DECISION MAKING: Stable/uncomplicated  EVALUATION COMPLEXITY: Low   GOALS: Goals reviewed with patient?  Yes  SHORT TERM GOALS: Target date: 08/31/2022    Will be compliant with appropriate progressive HEP  Baseline: Goal status: MET  2.  Lumbar ROM to have improved by 50% all planes of motion  Baseline:  Goal status: ON going  3.  LE muscle flexibility to have improved by 50%  Baseline:  Goal status: On going             4.  Will demonstrate good biomechanics for bed mobility and floor to waist height lifting  Baseline:  Goal status: ON going    LONG TERM GOALS: Target date: 09/21/2022    MMT to have improved by one grade in all weak groups and will score at least 20/24 on DGI Baseline:  Goal status: INITIAL  2.  Pain to be no more than 3/10 at worst  Baseline:  Goal status: INITIAL  3.  Will be able to return to all yardwork and work around the house without increase in pain  Baseline:  Goal status: INITIAL  4.  FOTO score to have improved by at least 10 points to  show subjective improvement  Baseline:  Goal status: INITIAL    PLAN:  PT FREQUENCY: 2x/week  PT DURATION: 6 weeks  PLANNED INTERVENTIONS: Therapeutic exercises, Therapeutic activity, Balance training, Gait training, Patient/Family education, Self Care, and Joint mobilization.  PLAN FOR NEXT SESSION: No traction (not appropriate clinically and per pt request); I. Otherwise work on lumbar ROM, LE flexibility, general LE and core strenth, biomechanics   Rexene Agent, PTA

## 2022-09-05 ENCOUNTER — Encounter: Payer: Self-pay | Admitting: *Deleted

## 2022-09-05 ENCOUNTER — Ambulatory Visit: Payer: Medicare Other | Admitting: *Deleted

## 2022-09-05 DIAGNOSIS — M5459 Other low back pain: Secondary | ICD-10-CM | POA: Diagnosis not present

## 2022-09-05 DIAGNOSIS — R293 Abnormal posture: Secondary | ICD-10-CM

## 2022-09-05 DIAGNOSIS — M6281 Muscle weakness (generalized): Secondary | ICD-10-CM

## 2022-09-05 NOTE — Therapy (Signed)
OUTPATIENT PHYSICAL THERAPY THORACOLUMBAR TREATMENT   Patient Name: Nicholas Yates MRN: [id number] DOB:06/28/1955, 67 y.o., male Today's Date: 09/05/2022  END OF SESSION:  PT End of Session - 09/05/22 1448     Visit Number 5    Number of Visits 13    Date for PT Re-Evaluation 09/21/22    Authorization Type [employer]    Authorization Time Period 08/10/22 to 09/21/22    Progress Note Due on Visit 10    PT Start Time 1430    PT Stop Time 1516    PT Time Calculation (min) 46 min             Past Medical History:  Diagnosis Date   Depression    GERD (gastroesophageal reflux disease)    had duodenal ulcer 5 months   Post traumatic stress disorder    Past Surgical History:  Procedure Laterality Date   BIOPSY  02/13/2022   Procedure: BIOPSY;  Surgeon: Lemar Lofty., MD;  Location: [facility];  Service: Gastroenterology;;   ESOPHAGOGASTRODUODENOSCOPY (EGD) WITH PROPOFOL N/A 02/13/2022   Procedure: ESOPHAGOGASTRODUODENOSCOPY (EGD) WITH PROPOFOL;  Surgeon: Lemar Lofty., MD;  Location: [facility];  Service: Gastroenterology;  Laterality: N/A;   HERNIA REPAIR     TOTAL HIP ARTHROPLASTY     bilateral   Patient Active Problem List   Diagnosis Date Noted   Chronic pain syndrome 05/02/2022   Acquired leg length discrepancy 05/02/2022   Bilateral low back pain without sciatica 05/02/2022   Facet arthritis of lumbar region 05/02/2022   Myofascial pain 05/02/2022   History of anemia 03/31/2022   Dark stools 03/31/2022   Excessive use of nonsteroidal anti-inflammatory drug (NSAID) 03/31/2022   Bilious vomiting with nausea 03/31/2022   Gastric ulcer 02/14/2022   GIB (gastrointestinal bleeding) 02/12/2022   ETOH abuse 02/12/2022   Symptomatic anemia 02/12/2022   Chronic back pain 02/12/2022   Dyslipidemia 04/29/2021   Extrapyramidal symptom 10/04/2018   Bipolar 2 disorder (HCC) 08/31/2018   ED (erectile dysfunction) 08/31/2018   Generalized anxiety  disorder 01/20/2017   Depression, recurrent (HCC) 07/07/2016   PTSD (post-traumatic stress disorder) 07/07/2016   Duodenal ulcer 07/07/2016    PCP: [name], NP  REFERRING PROVIDER: [name], NP  REFERRING DIAG: M54.50,G89.29 (ICD-10-CM) - Chronic bilateral low back pain without sciatica  Rationale for Evaluation and Treatment: Rehabilitation  THERAPY DIAG:  Other low back pain  Muscle weakness (generalized)  Abnormal posture  ONSET DATE: 8 years ago  SUBJECTIVE:  SUBJECTIVE STATEMENT: Pt reports LBP 8/10, but have been doing things around the house.Animals to feed. Mornings are rough.  PERTINENT HISTORY:  PTSD, hernia repair, THA B   PAIN:  Are you having pain? Yes: NPRS scale: 7/10 Pain location: B low back  Pain description: sharp, stabbing pain Aggravating factors: nothing  Relieving factors: tylenol   PRECAUTIONS: None  WEIGHT BEARING RESTRICTIONS: No  FALLS:  Has patient fallen in last 6 months? Yes. Number of falls 4 "one leg is a lot shorter than the other" , no FOF   LIVING ENVIRONMENT: Lives with: lives with their spouse Lives in: House/apartment Stairs: 1 STE, has upstairs that he goes to from time to time with rails  Has following equipment at home: None  OCCUPATION: retired   PLOF: Independent, Independent with basic ADLs, Independent with gait, and Independent with transfers  PATIENT GOALS: improve all aspects of mobility and pain   NEXT MD VISIT: "Not seeing [name] again, seeing a new MD, forgot his name" [name] May 28th per EPIC, pt reports this will new PCP   OBJECTIVE:   DIAGNOSTIC FINDINGS:  CLINICAL DATA:  Bilateral hip pain following multiple falls.   EXAM: DG HIP (WITH OR WITHOUT PELVIS) 3-4V BILAT   COMPARISON:   06/09/2021   FINDINGS: Again demonstrated are bilateral total hip prostheses in satisfactory position and alignment. Interval L5 vertebral compression deformity with kyphoplasty material. Diffuse osteopenia. No fracture or dislocation seen.   IMPRESSION: 1. No fracture or dislocation. 2. Bilateral total hip prostheses.      PATIENT SURVEYS:  FOTO will do 2nd session  SCREENING FOR RED FLAGS: Bowel or bladder incontinence: No Spinal tumors: No Cauda equina syndrome: No Compression fracture: No Abdominal aneurysm: No  COGNITION: Overall cognitive status: Within functional limits for tasks assessed     SENSATION: Not tested  MUSCLE LENGTH:  Hip flexors moderate limitation B Quads severe limitation B HS groups moderate limitation B Piriformis severe limitation B   POSTURE: rounded shoulders, forward head, decreased lumbar lordosis, increased thoracic kyphosis, and flexed trunk     LUMBAR ROM:   AROM eval  Flexion Moderate limitation, improvement in pain   Extension Severe limitation  Right lateral flexion Moderate limitation   Left lateral flexion Moderate limitation   Right rotation Moderate limitation   Left rotation Moderate limitation    (Blank rows = not tested)    LOWER EXTREMITY MMT:    MMT Right eval Left eval  Hip flexion 4 4  Hip extension 2 2  Hip abduction 4 4  Hip adduction    Hip internal rotation    Hip external rotation    Knee flexion 4+ 4-  Knee extension 4 4  Ankle dorsiflexion 4+ 4  Ankle plantarflexion    Ankle inversion    Ankle eversion     (Blank rows = not tested)     TODAY'S TREATMENT:  DATE:                                     EXERCISE LOG      09-02-22  Exercise Repetitions and Resistance Comments  Nustep  Lvl 3 x 15 mins   Bridging X10 hold 10 secs   Dying bug X6 hold 10 secs Bil   Ball  Press    XTS Lat pulldown Blue 2x10   XTS ROWs Blue 2x10   Standing bird dog X 6 hold   10 secs each side   Ball Roll Out    LAQs    Seated Marches    Ham Curls     Blank cell = exercise not performed today  Discussed AB bracing with ADL's. Also discussed buying a new "firm" mattress to see if that helps decrease morning pain. Pt reports current mattress is 69-24 years old.   PATIENT EDUCATION:  Education details: exam findings, POC, HEP  Person educated: Patient Education method: Explanation, Demonstration, and Handouts Education comprehension: verbalized understanding, returned demonstration, and needs further education  HOME EXERCISE PROGRAM: Access Code: [email or web address] URL: [email or web address] Date: 08/10/2022 Prepared by: Nedra Hai  Exercises - Single Knee to Chest Stretch  - 2 x daily - 7 x weekly - 1 sets - 10 reps - 3 hold - Supine Lower Trunk Rotation  - 2 x daily - 7 x weekly - 1 sets - 10 reps - 5 hold - Supine Bridge  - 2 x daily - 7 x weekly - 1 sets - 10 reps - 2 hold  ASSESSMENT:  CLINICAL IMPRESSION:     Pt arrived with LBP 7/10 due to doing work around the house. We discussed possibly buying a new mattress to decrease pain in the morning. Rx again focused on core strengthening and progressions and Pt did well.   OBJECTIVE IMPAIRMENTS: Abnormal gait, decreased mobility, difficulty walking, decreased ROM, decreased strength, increased fascial restrictions, increased muscle spasms, impaired flexibility, improper body mechanics, postural dysfunction, and pain.   ACTIVITY LIMITATIONS: carrying, lifting, sitting, standing, stairs, transfers, and locomotion level  PARTICIPATION LIMITATIONS: driving, shopping, community activity, and yard work  PERSONAL FACTORS: Age, Behavior pattern, Education, Fitness, Past/current experiences, and Time since onset of injury/illness/exacerbation are also affecting patient's functional outcome.   REHAB POTENTIAL:  Good  CLINICAL DECISION MAKING: Stable/uncomplicated  EVALUATION COMPLEXITY: Low   GOALS: Goals reviewed with patient? Yes  SHORT TERM GOALS: Target date: 08/31/2022    Will be compliant with appropriate progressive HEP  Baseline: Goal status: MET  2.  Lumbar ROM to have improved by 50% all planes of motion  Baseline:  Goal status: ON going  3.  LE muscle flexibility to have improved by 50%  Baseline:  Goal status: On going             4.  Will demonstrate good biomechanics for bed mobility and floor to waist height lifting  Baseline:  Goal status: ON going    LONG TERM GOALS: Target date: 09/21/2022    MMT to have improved by one grade in all weak groups and will score at least 20/24 on DGI Baseline:  Goal status: On going  2.  Pain to be no more than 3/10 at worst  Baseline:  Goal status: On going  3.  Will be able to return to all yardwork and work around the house without increase in pain  Baseline:  Goal status: On going  4.  FOTO score to have improved by at least 10 points to show subjective improvement  Baseline:  Goal status: On going    PLAN:  PT FREQUENCY: 2x/week  PT DURATION: 6 weeks  PLANNED INTERVENTIONS: Therapeutic exercises, Therapeutic activity, Balance training, Gait training, Patient/Family education, Self Care, and Joint mobilization.  PLAN FOR NEXT SESSION: No traction (not appropriate clinically and per pt request); I. Otherwise work on lumbar ROM, LE flexibility, general LE and core strenth, biomechanics   Rexene Agent, PTA

## 2022-09-07 ENCOUNTER — Ambulatory Visit: Payer: Medicare Other

## 2022-09-07 DIAGNOSIS — M5459 Other low back pain: Secondary | ICD-10-CM

## 2022-09-07 DIAGNOSIS — M6281 Muscle weakness (generalized): Secondary | ICD-10-CM

## 2022-09-07 DIAGNOSIS — R293 Abnormal posture: Secondary | ICD-10-CM

## 2022-09-07 NOTE — Therapy (Signed)
OUTPATIENT PHYSICAL THERAPY THORACOLUMBAR TREATMENT   Patient Name: Nicholas Yates MRN: [id number] DOB:04-03-1955, 67 y.o., male Today's Date: 09/07/2022  END OF SESSION:  PT End of Session - 09/07/22 1305     Visit Number 6    Number of Visits 13    Date for PT Re-Evaluation 09/21/22    Authorization Type [employer] Medicare    Authorization Time Period 08/10/22 to 09/21/22    Progress Note Due on Visit 10    PT Start Time 1300    PT Stop Time 1330    PT Time Calculation (min) 30 min             Past Medical History:  Diagnosis Date   Depression    GERD (gastroesophageal reflux disease)    had duodenal ulcer 5 months   Post traumatic stress disorder    Past Surgical History:  Procedure Laterality Date   BIOPSY  02/13/2022   Procedure: BIOPSY;  Surgeon: Lemar Lofty., MD;  Location: [facility];  Service: Gastroenterology;;   ESOPHAGOGASTRODUODENOSCOPY (EGD) WITH PROPOFOL N/A 02/13/2022   Procedure: ESOPHAGOGASTRODUODENOSCOPY (EGD) WITH PROPOFOL;  Surgeon: Lemar Lofty., MD;  Location: [facility];  Service: Gastroenterology;  Laterality: N/A;   HERNIA REPAIR     TOTAL HIP ARTHROPLASTY     bilateral   Patient Active Problem List   Diagnosis Date Noted   Chronic pain syndrome 05/02/2022   Acquired leg length discrepancy 05/02/2022   Bilateral low back pain without sciatica 05/02/2022   Facet arthritis of lumbar region 05/02/2022   Myofascial pain 05/02/2022   History of anemia 03/31/2022   Dark stools 03/31/2022   Excessive use of nonsteroidal anti-inflammatory drug (NSAID) 03/31/2022   Bilious vomiting with nausea 03/31/2022   Gastric ulcer 02/14/2022   GIB (gastrointestinal bleeding) 02/12/2022   ETOH abuse 02/12/2022   Symptomatic anemia 02/12/2022   Chronic back pain 02/12/2022   Dyslipidemia 04/29/2021   Extrapyramidal symptom 10/04/2018   Bipolar 2 disorder (HCC) 08/31/2018   ED (erectile dysfunction) 08/31/2018   Generalized anxiety  disorder 01/20/2017   Depression, recurrent (HCC) 07/07/2016   PTSD (post-traumatic stress disorder) 07/07/2016   Duodenal ulcer 07/07/2016    PCP: [name], NP  REFERRING PROVIDER: [name], NP  REFERRING DIAG: M54.50,G89.29 (ICD-10-CM) - Chronic bilateral low back pain without sciatica  Rationale for Evaluation and Treatment: Rehabilitation  THERAPY DIAG:  Other low back pain  Muscle weakness (generalized)  Abnormal posture  ONSET DATE: 8 years ago  SUBJECTIVE:  SUBJECTIVE STATEMENT: Pt reports 7/10 low back pain today.  Pt also reports having stomach issues today.   PERTINENT HISTORY:  PTSD, hernia repair, THA B   PAIN:  Are you having pain? Yes: NPRS scale: 7/10 Pain location: B low back  Pain description: sharp, stabbing pain Aggravating factors: nothing  Relieving factors: tylenol   PRECAUTIONS: None  WEIGHT BEARING RESTRICTIONS: No  FALLS:  Has patient fallen in last 6 months? Yes. Number of falls 4 "one leg is a lot shorter than the other" , no FOF   LIVING ENVIRONMENT: Lives with: lives with their spouse Lives in: House/apartment Stairs: 1 STE, has upstairs that he goes to from time to time with rails  Has following equipment at home: None  OCCUPATION: retired   PLOF: Independent, Independent with basic ADLs, Independent with gait, and Independent with transfers  PATIENT GOALS: improve all aspects of mobility and pain   NEXT MD VISIT: "Not seeing [name] again, seeing a new MD, forgot his name" [name] May 28th per EPIC, pt reports this will new PCP   OBJECTIVE:   DIAGNOSTIC FINDINGS:  CLINICAL DATA:  Bilateral hip pain following multiple falls.   EXAM: DG HIP (WITH OR WITHOUT PELVIS) 3-4V BILAT   COMPARISON:  06/09/2021    FINDINGS: Again demonstrated are bilateral total hip prostheses in satisfactory position and alignment. Interval L5 vertebral compression deformity with kyphoplasty material. Diffuse osteopenia. No fracture or dislocation seen.   IMPRESSION: 1. No fracture or dislocation. 2. Bilateral total hip prostheses.      PATIENT SURVEYS:  FOTO will do 2nd session  SCREENING FOR RED FLAGS: Bowel or bladder incontinence: No Spinal tumors: No Cauda equina syndrome: No Compression fracture: No Abdominal aneurysm: No  COGNITION: Overall cognitive status: Within functional limits for tasks assessed     SENSATION: Not tested  MUSCLE LENGTH:  Hip flexors moderate limitation B Quads severe limitation B HS groups moderate limitation B Piriformis severe limitation B   POSTURE: rounded shoulders, forward head, decreased lumbar lordosis, increased thoracic kyphosis, and flexed trunk     LUMBAR ROM:   AROM eval  Flexion Moderate limitation, improvement in pain   Extension Severe limitation  Right lateral flexion Moderate limitation   Left lateral flexion Moderate limitation   Right rotation Moderate limitation   Left rotation Moderate limitation    (Blank rows = not tested)    LOWER EXTREMITY MMT:    MMT Right eval Left eval  Hip flexion 4 4  Hip extension 2 2  Hip abduction 4 4  Hip adduction    Hip internal rotation    Hip external rotation    Knee flexion 4+ 4-  Knee extension 4 4  Ankle dorsiflexion 4+ 4  Ankle plantarflexion    Ankle inversion    Ankle eversion     (Blank rows = not tested)     TODAY'S TREATMENT:  DATE:                                     EXERCISE LOG      09-07-22  Exercise Repetitions and Resistance Comments  Nustep  Lvl 3 x 15 mins   Bridging    Dying bug    [employer]    XTS Lat pulldown Blue x25 reps   XTS  ROWs Blue x25 reps   Standing bird dog 6 reps x 10 sec hold bil   [employer] Out    LAQs    Seated Marches    Ham Curls    Hip Abduction    Ball Squeezes     Blank cell = exercise not performed today    PATIENT EDUCATION:  Education details: exam findings, POC, HEP  Person educated: Patient Education method: Programmer, multimedia, Demonstration, and Handouts Education comprehension: verbalized understanding, returned demonstration, and needs further education  HOME EXERCISE PROGRAM: Access Code: [email or web address] URL: [email or web address] Date: 08/10/2022 Prepared by: Nedra Hai  Exercises - Single Knee to Chest Stretch  - 2 x daily - 7 x weekly - 1 sets - 10 reps - 3 hold - Supine Lower Trunk Rotation  - 2 x daily - 7 x weekly - 1 sets - 10 reps - 5 hold - Supine Bridge  - 2 x daily - 7 x weekly - 1 sets - 10 reps - 2 hold  ASSESSMENT:  CLINICAL IMPRESSION:       Pt arrives for today's treatment session reporting 7/10 low back pain.  Pt also reports having stomach issues today due to taking his medication without food.  Due to pt's stomach pain, pt only able to tolerate a few exercises today before having to leave.  Pt reports that his back feels minimal better since beginning therapy.  Pt denied any change in pain at completion of today's treatment session.   OBJECTIVE IMPAIRMENTS: Abnormal gait, decreased mobility, difficulty walking, decreased ROM, decreased strength, increased fascial restrictions, increased muscle spasms, impaired flexibility, improper body mechanics, postural dysfunction, and pain.   ACTIVITY LIMITATIONS: carrying, lifting, sitting, standing, stairs, transfers, and locomotion level  PARTICIPATION LIMITATIONS: driving, shopping, community activity, and yard work  PERSONAL FACTORS: Age, Behavior pattern, Education, Fitness, Past/current experiences, and Time since onset of injury/illness/exacerbation are also affecting patient's functional outcome.    REHAB POTENTIAL: Good  CLINICAL DECISION MAKING: Stable/uncomplicated  EVALUATION COMPLEXITY: Low   GOALS: Goals reviewed with patient? Yes  SHORT TERM GOALS: Target date: 08/31/2022    Will be compliant with appropriate progressive HEP  Baseline: Goal status: MET  2.  Lumbar ROM to have improved by 50% all planes of motion  Baseline:  Goal status: ON going  3.  LE muscle flexibility to have improved by 50%  Baseline:  Goal status: On going             4.  Will demonstrate good biomechanics for bed mobility and floor to waist height lifting  Baseline:  Goal status: ON going    LONG TERM GOALS: Target date: 09/21/2022    MMT to have improved by one grade in all weak groups and will score at least 20/24 on DGI Baseline:  Goal status: On going  2.  Pain to be no more than 3/10 at worst  Baseline:  Goal status: On going  3.  Will be able to return to all yardwork and work  around the house without increase in pain  Baseline:  Goal status: On going  4.  FOTO score to have improved by at least 10 points to show subjective improvement  Baseline:  Goal status: On going    PLAN:  PT FREQUENCY: 2x/week  PT DURATION: 6 weeks  PLANNED INTERVENTIONS: Therapeutic exercises, Therapeutic activity, Balance training, Gait training, Patient/Family education, Self Care, and Joint mobilization.  PLAN FOR NEXT SESSION: No traction (not appropriate clinically and per pt request); I. Otherwise work on lumbar ROM, LE flexibility, general LE and core strenth, biomechanics   Rexene Agent, PTA

## 2022-09-12 ENCOUNTER — Ambulatory Visit: Payer: Medicare Other | Admitting: *Deleted

## 2022-09-14 ENCOUNTER — Ambulatory Visit: Payer: Medicare Other | Admitting: Physical Therapy

## 2022-09-14 DIAGNOSIS — M5459 Other low back pain: Secondary | ICD-10-CM

## 2022-09-14 DIAGNOSIS — M6281 Muscle weakness (generalized): Secondary | ICD-10-CM

## 2022-09-14 NOTE — Therapy (Signed)
OUTPATIENT PHYSICAL THERAPY THORACOLUMBAR TREATMENT   Patient Name: Nicholas Yates MRN: [id number] DOB:[birth year], 67 y.o., male Today's Date: 09/14/2022  END OF SESSION:  PT End of Session - 09/14/22 1318     Visit Number 7    Number of Visits 13    Date for PT Re-Evaluation 09/21/22    Authorization Type [employer] Medicare    Authorization Time Period 08/10/22 to 09/21/22    PT Start Time 0102    PT Stop Time 0152    PT Time Calculation (min) 50 min    Activity Tolerance Patient tolerated treatment well    Behavior During Therapy [facility] for tasks assessed/performed              Past Medical History:  Diagnosis Date   Depression    GERD (gastroesophageal reflux disease)    had duodenal ulcer 5 months   Post traumatic stress disorder    Past Surgical History:  Procedure Laterality Date   BIOPSY  02/13/2022   Procedure: BIOPSY;  Surgeon: Lemar Lofty., MD;  Location: [facility];  Service: Gastroenterology;;   ESOPHAGOGASTRODUODENOSCOPY (EGD) WITH PROPOFOL N/A 02/13/2022   Procedure: ESOPHAGOGASTRODUODENOSCOPY (EGD) WITH PROPOFOL;  Surgeon: Lemar Lofty., MD;  Location: [facility];  Service: Gastroenterology;  Laterality: N/A;   HERNIA REPAIR     TOTAL HIP ARTHROPLASTY     bilateral   Patient Active Problem List   Diagnosis Date Noted   Chronic pain syndrome 05/02/2022   Acquired leg length discrepancy 05/02/2022   Bilateral low back pain without sciatica 05/02/2022   Facet arthritis of lumbar region 05/02/2022   Myofascial pain 05/02/2022   History of anemia 03/31/2022   Dark stools 03/31/2022   Excessive use of nonsteroidal anti-inflammatory drug (NSAID) 03/31/2022   Bilious vomiting with nausea 03/31/2022   Gastric ulcer 02/14/2022   GIB (gastrointestinal bleeding) 02/12/2022   ETOH abuse 02/12/2022   Symptomatic anemia 02/12/2022   Chronic back pain 02/12/2022   Dyslipidemia 04/29/2021   Extrapyramidal symptom 10/04/2018   Bipolar 2  disorder (HCC) 08/31/2018   ED (erectile dysfunction) 08/31/2018   Generalized anxiety disorder 01/20/2017   Depression, recurrent (HCC) 07/07/2016   PTSD (post-traumatic stress disorder) 07/07/2016   Duodenal ulcer 07/07/2016    PCP: [name], NP  REFERRING PROVIDER: [name], NP  REFERRING DIAG: M54.50,G89.29 (ICD-10-CM) - Chronic bilateral low back pain without sciatica  Rationale for Evaluation and Treatment: Rehabilitation  THERAPY DIAG:  Other low back pain  Muscle weakness (generalized)  ONSET DATE: 8 years ago  SUBJECTIVE:  SUBJECTIVE STATEMENT: Pt reports 7/10 low back pain today.  Tripped on left foot and fell.  Recommended he use a cane for safety. PERTINENT HISTORY:  PTSD, hernia repair, THA B   PAIN:  Are you having pain? Yes: NPRS scale: 7/10 Pain location: B low back  Pain description: sharp, stabbing pain Aggravating factors: nothing  Relieving factors: tylenol   PRECAUTIONS: None  WEIGHT BEARING RESTRICTIONS: No  FALLS:  Has patient fallen in last 6 months? Yes. Number of falls 4 "one leg is a lot shorter than the other" , no FOF   LIVING ENVIRONMENT: Lives with: lives with their spouse Lives in: House/apartment Stairs: 1 STE, has upstairs that he goes to from time to time with rails  Has following equipment at home: None  OCCUPATION: retired   PLOF: Independent, Independent with basic ADLs, Independent with gait, and Independent with transfers  PATIENT GOALS: improve all aspects of mobility and pain   NEXT MD VISIT: "Not seeing [name] again, seeing a new MD, forgot his name" [name] May 28th per EPIC, pt reports this will new PCP   OBJECTIVE:   DIAGNOSTIC FINDINGS:  CLINICAL DATA:  Bilateral hip pain following multiple falls.    EXAM: DG HIP (WITH OR WITHOUT PELVIS) 3-4V BILAT   COMPARISON:  06/09/2021   FINDINGS: Again demonstrated are bilateral total hip prostheses in satisfactory position and alignment. Interval L5 vertebral compression deformity with kyphoplasty material. Diffuse osteopenia. No fracture or dislocation seen.   IMPRESSION: 1. No fracture or dislocation. 2. Bilateral total hip prostheses.      PATIENT SURVEYS:  FOTO will do 2nd session  SCREENING FOR RED FLAGS: Bowel or bladder incontinence: No Spinal tumors: No Cauda equina syndrome: No Compression fracture: No Abdominal aneurysm: No  COGNITION: Overall cognitive status: Within functional limits for tasks assessed     SENSATION: Not tested  MUSCLE LENGTH:  Hip flexors moderate limitation B Quads severe limitation B HS groups moderate limitation B Piriformis severe limitation B   POSTURE: rounded shoulders, forward head, decreased lumbar lordosis, increased thoracic kyphosis, and flexed trunk     LUMBAR ROM:   AROM eval  Flexion Moderate limitation, improvement in pain   Extension Severe limitation  Right lateral flexion Moderate limitation   Left lateral flexion Moderate limitation   Right rotation Moderate limitation   Left rotation Moderate limitation    (Blank rows = not tested)    LOWER EXTREMITY MMT:    MMT Right eval Left eval  Hip flexion 4 4  Hip extension 2 2  Hip abduction 4 4  Hip adduction    Hip internal rotation    Hip external rotation    Knee flexion 4+ 4-  Knee extension 4 4  Ankle dorsiflexion 4+ 4  Ankle plantarflexion    Ankle inversion    Ankle eversion     (Blank rows = not tested)     TODAY'S TREATMENT:  DATE:                                     EXERCISE LOG      09-14-22  Exercise Repetitions and Resistance Comments  Nustep  Lvl 3 x 15 mins    Left sdly position with folded pillow between knees for comfort:  STW/M x 15 minutes to patient lumbar musculature and ischemic release technique to his right QL f/b HMP x 10 minutes. Normal modality response following removal of modality.    PATIENT EDUCATION:  Education details: exam findings, POC, HEP  Person educated: Patient Education method: Explanation, Demonstration, and Handouts Education comprehension: verbalized understanding, returned demonstration, and needs further education  HOME EXERCISE PROGRAM: Access Code: [email or web address] URL: [email or web address] Date: 08/10/2022 Prepared by: Nedra Hai  Exercises - Single Knee to Chest Stretch  - 2 x daily - 7 x weekly - 1 sets - 10 reps - 3 hold - Supine Lower Trunk Rotation  - 2 x daily - 7 x weekly - 1 sets - 10 reps - 5 hold - Supine Bridge  - 2 x daily - 7 x weekly - 1 sets - 10 reps - 2 hold  ASSESSMENT:  CLINICAL IMPRESSION:      The patient tripped on right foot and fell.  Recommended he use a cane for safety.  He had a notable trigger point in his right QL and had a very good response to STW/M and felt much better after treatment.   OBJECTIVE IMPAIRMENTS: Abnormal gait, decreased mobility, difficulty walking, decreased ROM, decreased strength, increased fascial restrictions, increased muscle spasms, impaired flexibility, improper body mechanics, postural dysfunction, and pain.   ACTIVITY LIMITATIONS: carrying, lifting, sitting, standing, stairs, transfers, and locomotion level  PARTICIPATION LIMITATIONS: driving, shopping, community activity, and yard work  PERSONAL FACTORS: Age, Behavior pattern, Education, Fitness, Past/current experiences, and Time since onset of injury/illness/exacerbation are also affecting patient's functional outcome.   REHAB POTENTIAL: Good  CLINICAL DECISION MAKING: Stable/uncomplicated  EVALUATION COMPLEXITY: Low   GOALS: Goals reviewed with patient? Yes  SHORT TERM  GOALS: Target date: 08/31/2022    Will be compliant with appropriate progressive HEP  Baseline: Goal status: MET  2.  Lumbar ROM to have improved by 50% all planes of motion  Baseline:  Goal status: ON going  3.  LE muscle flexibility to have improved by 50%  Baseline:  Goal status: On going             4.  Will demonstrate good biomechanics for bed mobility and floor to waist height lifting  Baseline:  Goal status: ON going    LONG TERM GOALS: Target date: 09/21/2022    MMT to have improved by one grade in all weak groups and will score at least 20/24 on DGI Baseline:  Goal status: On going  2.  Pain to be no more than 3/10 at worst  Baseline:  Goal status: On going  3.  Will be able to return to all yardwork and work around the house without increase in pain  Baseline:  Goal status: On going  4.  FOTO score to have improved by at least 10 points to show subjective improvement  Baseline:  Goal status: On going    PLAN:  PT FREQUENCY: 2x/week  PT DURATION: 6 weeks  PLANNED INTERVENTIONS: Therapeutic exercises, Therapeutic activity, Balance training, Gait training, Patient/Family education, Self Care, and Joint mobilization.  PLAN FOR NEXT SESSION: No traction (not appropriate clinically and per pt request); I. Otherwise work on lumbar ROM, LE flexibility, general LE and core strenth, biomechanics   Rexene Agent, PTA

## 2022-09-16 ENCOUNTER — Ambulatory Visit: Payer: Medicare Other

## 2022-09-19 ENCOUNTER — Ambulatory Visit: Payer: Medicare Other | Attending: Family Medicine

## 2022-09-19 DIAGNOSIS — R293 Abnormal posture: Secondary | ICD-10-CM | POA: Diagnosis present

## 2022-09-19 DIAGNOSIS — M5459 Other low back pain: Secondary | ICD-10-CM | POA: Diagnosis present

## 2022-09-19 DIAGNOSIS — M6281 Muscle weakness (generalized): Secondary | ICD-10-CM | POA: Diagnosis present

## 2022-09-19 NOTE — Therapy (Signed)
OUTPATIENT PHYSICAL THERAPY THORACOLUMBAR TREATMENT   Patient Name: Nicholas Yates MRN: [id number] DOB:[birth year], 67 y.o., male Today's Date: 09/19/2022  END OF SESSION:  PT End of Session - 09/19/22 1303     Visit Number 8    Number of Visits 13    Date for PT Re-Evaluation 09/21/22    Authorization Type [employer] Medicare    Authorization Time Period 08/10/22 to 09/21/22    Progress Note Due on Visit 10    PT Start Time 1300    PT Stop Time 1345    PT Time Calculation (min) 45 min    Activity Tolerance Patient tolerated treatment well    Behavior During Therapy [facility] for tasks assessed/performed              Past Medical History:  Diagnosis Date   Depression    GERD (gastroesophageal reflux disease)    had duodenal ulcer 5 months   Post traumatic stress disorder    Past Surgical History:  Procedure Laterality Date   BIOPSY  02/13/2022   Procedure: BIOPSY;  Surgeon: Nicholas Yates., MD;  Location: [facility];  Service: Gastroenterology;;   ESOPHAGOGASTRODUODENOSCOPY (EGD) WITH PROPOFOL N/A 02/13/2022   Procedure: ESOPHAGOGASTRODUODENOSCOPY (EGD) WITH PROPOFOL;  Surgeon: Nicholas Yates., MD;  Location: [facility];  Service: Gastroenterology;  Laterality: N/A;   HERNIA REPAIR     TOTAL HIP ARTHROPLASTY     bilateral   Patient Active Problem List   Diagnosis Date Noted   Chronic pain syndrome 05/02/2022   Acquired leg length discrepancy 05/02/2022   Bilateral low back pain without sciatica 05/02/2022   Facet arthritis of lumbar region 05/02/2022   Myofascial pain 05/02/2022   History of anemia 03/31/2022   Dark stools 03/31/2022   Excessive use of nonsteroidal anti-inflammatory drug (NSAID) 03/31/2022   Bilious vomiting with nausea 03/31/2022   Gastric ulcer 02/14/2022   GIB (gastrointestinal bleeding) 02/12/2022   ETOH abuse 02/12/2022   Symptomatic anemia 02/12/2022   Chronic back pain 02/12/2022   Dyslipidemia 04/29/2021   Extrapyramidal  symptom 10/04/2018   Bipolar 2 disorder (HCC) 08/31/2018   ED (erectile dysfunction) 08/31/2018   Generalized anxiety disorder 01/20/2017   Depression, recurrent (HCC) 07/07/2016   PTSD (post-traumatic stress disorder) 07/07/2016   Duodenal ulcer 07/07/2016    PCP: Nicholas Dana, NP  REFERRING PROVIDER: [name], NP  REFERRING DIAG: M54.50,G89.29 (ICD-10-CM) - Chronic bilateral low back pain without sciatica  Rationale for Evaluation and Treatment: Rehabilitation  THERAPY DIAG:  Other low back pain  Muscle weakness (generalized)  Abnormal posture  ONSET DATE: 8 years ago  SUBJECTIVE:  SUBJECTIVE STATEMENT: Pt reports 6/10 low back pain today.  Pt reports feeling much better up to two days after last treatment session.   PERTINENT HISTORY:  PTSD, hernia repair, THA B   PAIN:  Are you having pain? Yes: NPRS scale: 6/10 Pain location: B low back  Pain description: sharp, stabbing pain Aggravating factors: nothing  Relieving factors: tylenol   PRECAUTIONS: None  WEIGHT BEARING RESTRICTIONS: No  FALLS:  Has patient fallen in last 6 months? Yes. Number of falls 4 "one leg is a lot shorter than the other" , no FOF   LIVING ENVIRONMENT: Lives with: lives with their spouse Lives in: House/apartment Stairs: 1 STE, has upstairs that he goes to from time to time with rails  Has following equipment at home: None  OCCUPATION: retired   PLOF: Independent, Independent with basic ADLs, Independent with gait, and Independent with transfers  PATIENT GOALS: improve all aspects of mobility and pain   NEXT MD VISIT: "Not seeing Nicholas Yates again, seeing a new MD, forgot his name" Nicholas Yates May 28th per EPIC, pt reports this will new PCP   OBJECTIVE:   DIAGNOSTIC FINDINGS:  CLINICAL  DATA:  Bilateral hip pain following multiple falls.   EXAM: DG HIP (WITH OR WITHOUT PELVIS) 3-4V BILAT   COMPARISON:  06/09/2021   FINDINGS: Again demonstrated are bilateral total hip prostheses in satisfactory position and alignment. Interval L5 vertebral compression deformity with kyphoplasty material. Diffuse osteopenia. No fracture or dislocation seen.   IMPRESSION: 1. No fracture or dislocation. 2. Bilateral total hip prostheses.      PATIENT SURVEYS:  FOTO will do 2nd session  SCREENING FOR RED FLAGS: Bowel or bladder incontinence: No Spinal tumors: No Cauda equina syndrome: No Compression fracture: No Abdominal aneurysm: No  COGNITION: Overall cognitive status: Within functional limits for tasks assessed     SENSATION: Not tested  MUSCLE LENGTH:  Hip flexors moderate limitation B Quads severe limitation B HS groups moderate limitation B Piriformis severe limitation B   POSTURE: rounded shoulders, forward head, decreased lumbar lordosis, increased thoracic kyphosis, and flexed trunk     LUMBAR ROM:   AROM eval  Flexion Moderate limitation, improvement in pain   Extension Severe limitation  Right lateral flexion Moderate limitation   Left lateral flexion Moderate limitation   Right rotation Moderate limitation   Left rotation Moderate limitation    (Blank rows = not tested)    LOWER EXTREMITY MMT:    MMT Right eval Left eval  Hip flexion 4 4  Hip extension 2 2  Hip abduction 4 4  Hip adduction    Hip internal rotation    Hip external rotation    Knee flexion 4+ 4-  Knee extension 4 4  Ankle dorsiflexion 4+ 4  Ankle plantarflexion    Ankle inversion    Ankle eversion     (Blank rows = not tested)     TODAY'S TREATMENT:  DATE:                                     EXERCISE LOG      09-19-22  Exercise  Repetitions and Resistance Comments  Nustep  Lvl 5 x 20 mins    Left sdly position with folded pillow between knees for comfort:  STW/M to patient lumbar musculature and ischemic release technique to his right QL.    PATIENT EDUCATION:  Education details: exam findings, POC, HEP  Person educated: Patient Education method: Explanation, Demonstration, and Handouts Education comprehension: verbalized understanding, returned demonstration, and needs further education  HOME EXERCISE PROGRAM: Access Code: [email or web address] URL: [email or web address] Date: 08/10/2022 Prepared by: Nedra Hai  Exercises - Single Knee to Chest Stretch  - 2 x daily - 7 x weekly - 1 sets - 10 reps - 3 hold - Supine Lower Trunk Rotation  - 2 x daily - 7 x weekly - 1 sets - 10 reps - 5 hold - Supine Bridge  - 2 x daily - 7 x weekly - 1 sets - 10 reps - 2 hold  ASSESSMENT:  CLINICAL IMPRESSION:      Pt arrives for today's treatment session reporting 6/10 right low back pain.  Pt reports that he had relief from his pain for two days after last treatment session.  Pt able to tolerate increased time on the Nustep today without discomfort, but does report increased fatigue.  STW/M performed to right QL and lumbar musculature to decrease pain and tone.  Pt positioned in left side-lying for comfort with pillow between his knees.  Pt reported decrease in right low back at completion of today's treatment session.   OBJECTIVE IMPAIRMENTS: Abnormal gait, decreased mobility, difficulty walking, decreased ROM, decreased strength, increased fascial restrictions, increased muscle spasms, impaired flexibility, improper body mechanics, postural dysfunction, and pain.   ACTIVITY LIMITATIONS: carrying, lifting, sitting, standing, stairs, transfers, and locomotion level  PARTICIPATION LIMITATIONS: driving, shopping, community activity, and yard work  PERSONAL FACTORS: Age, Behavior pattern, Education, Fitness, Past/current  experiences, and Time since onset of injury/illness/exacerbation are also affecting patient's functional outcome.   REHAB POTENTIAL: Good  CLINICAL DECISION MAKING: Stable/uncomplicated  EVALUATION COMPLEXITY: Low   GOALS: Goals reviewed with patient? Yes  SHORT TERM GOALS: Target date: 08/31/2022    Will be compliant with appropriate progressive HEP  Baseline: Goal status: MET  2.  Lumbar ROM to have improved by 50% all planes of motion  Baseline:  Goal status: ON going  3.  LE muscle flexibility to have improved by 50%  Baseline:  Goal status: On going             4.  Will demonstrate good biomechanics for bed mobility and floor to waist height lifting  Baseline:  Goal status: ON going    LONG TERM GOALS: Target date: 09/21/2022    MMT to have improved by one grade in all weak groups and will score at least 20/24 on DGI Baseline:  Goal status: On going  2.  Pain to be no more than 3/10 at worst  Baseline:  Goal status: On going  3.  Will be able to return to all yardwork and work around the house without increase in pain  Baseline:  Goal status: On going  4.  FOTO score to have improved by at least 10 points to show subjective improvement  Baseline:  Goal status: On  going  PLAN:  PT FREQUENCY: 2x/week  PT DURATION: 6 weeks  PLANNED INTERVENTIONS: Therapeutic exercises, Therapeutic activity, Balance training, Gait training, Patient/Family education, Self Care, and Joint mobilization.  PLAN FOR NEXT SESSION: No traction (not appropriate clinically and per pt request); I. Otherwise work on lumbar ROM, LE flexibility, general LE and core strenth, biomechanics   Rexene Agent, PTA

## 2022-09-26 ENCOUNTER — Ambulatory Visit: Payer: Medicare Other | Admitting: Physical Therapy

## 2022-09-26 DIAGNOSIS — M6281 Muscle weakness (generalized): Secondary | ICD-10-CM

## 2022-09-26 DIAGNOSIS — R293 Abnormal posture: Secondary | ICD-10-CM

## 2022-09-26 DIAGNOSIS — M5459 Other low back pain: Secondary | ICD-10-CM

## 2022-09-26 NOTE — Therapy (Signed)
OUTPATIENT PHYSICAL THERAPY THORACOLUMBAR TREATMENT   Patient Name: Nicholas Yates MRN: [id number] DOB:20-Jan-1956, 67 y.o., male Today's Date: 09/26/2022  END OF SESSION:  PT End of Session - 09/26/22 1344     Visit Number 9    Number of Visits 13    Date for PT Re-Evaluation 09/21/22    Authorization Type [employer]    Authorization Time Period 08/10/22 to 09/21/22    Progress Note Due on Visit 10    PT Start Time 0107    PT Stop Time 0152    PT Time Calculation (min) 45 min    Activity Tolerance Patient tolerated treatment well    Behavior During Therapy [facility] for tasks assessed/performed              Past Medical History:  Diagnosis Date   Depression    GERD (gastroesophageal reflux disease)    had duodenal ulcer 5 months   Post traumatic stress disorder    Past Surgical History:  Procedure Laterality Date   BIOPSY  02/13/2022   Procedure: BIOPSY;  Surgeon: Nicholas Yates., MD;  Location: [facility];  Service: Gastroenterology;;   ESOPHAGOGASTRODUODENOSCOPY (EGD) WITH PROPOFOL N/A 02/13/2022   Procedure: ESOPHAGOGASTRODUODENOSCOPY (EGD) WITH PROPOFOL;  Surgeon: Nicholas Yates., MD;  Location: [facility];  Service: Gastroenterology;  Laterality: N/A;   HERNIA REPAIR     TOTAL HIP ARTHROPLASTY     bilateral   Patient Active Problem List   Diagnosis Date Noted   Chronic pain syndrome 05/02/2022   Acquired leg length discrepancy 05/02/2022   Bilateral low back pain without sciatica 05/02/2022   Facet arthritis of lumbar region 05/02/2022   Myofascial pain 05/02/2022   History of anemia 03/31/2022   Dark stools 03/31/2022   Excessive use of nonsteroidal anti-inflammatory drug (NSAID) 03/31/2022   Bilious vomiting with nausea 03/31/2022   Gastric ulcer 02/14/2022   GIB (gastrointestinal bleeding) 02/12/2022   ETOH abuse 02/12/2022   Symptomatic anemia 02/12/2022   Chronic back pain 02/12/2022   Dyslipidemia 04/29/2021   Extrapyramidal  symptom 10/04/2018   Bipolar 2 disorder (HCC) 08/31/2018   ED (erectile dysfunction) 08/31/2018   Generalized anxiety disorder 01/20/2017   Depression, recurrent (HCC) 07/07/2016   PTSD (post-traumatic stress disorder) 07/07/2016   Duodenal ulcer 07/07/2016    PCP: Nicholas Dana, NP  REFERRING PROVIDER: [name], NP  REFERRING DIAG: M54.50,G89.29 (ICD-10-CM) - Chronic bilateral low back pain without sciatica  Rationale for Evaluation and Treatment: Rehabilitation  THERAPY DIAG:  Other low back pain  Muscle weakness (generalized)  Abnormal posture  ONSET DATE: 8 years ago  SUBJECTIVE:  SUBJECTIVE STATEMENT: Pain at an 8 today.  PERTINENT HISTORY:  PTSD, hernia repair, THA B   PAIN:  Are you having pain? Yes: NPRS scale: 8/10 Pain location: B low back  Pain description: sharp, stabbing pain Aggravating factors: nothing  Relieving factors: tylenol   PRECAUTIONS: None  WEIGHT BEARING RESTRICTIONS: No  FALLS:  Has patient fallen in last 6 months? Yes. Number of falls 4 "one leg is a lot shorter than the other" , no FOF   LIVING ENVIRONMENT: Lives with: lives with their spouse Lives in: House/apartment Stairs: 1 STE, has upstairs that he goes to from time to time with rails  Has following equipment at home: None  OCCUPATION: retired   PLOF: Independent, Independent with basic ADLs, Independent with gait, and Independent with transfers  PATIENT GOALS: improve all aspects of mobility and pain   NEXT MD VISIT: "Not seeing Nicholas Yates again, seeing a new MD, forgot his name" Nicholas Yates May 28th per EPIC, pt reports this will new PCP   OBJECTIVE:   DIAGNOSTIC FINDINGS:  CLINICAL DATA:  Bilateral hip pain following multiple falls.   EXAM: DG HIP (WITH OR WITHOUT PELVIS)  3-4V BILAT   COMPARISON:  06/09/2021   FINDINGS: Again demonstrated are bilateral total hip prostheses in satisfactory position and alignment. Interval L5 vertebral compression deformity with kyphoplasty material. Diffuse osteopenia. No fracture or dislocation seen.   IMPRESSION: 1. No fracture or dislocation. 2. Bilateral total hip prostheses.      PATIENT SURVEYS:  FOTO will do 2nd session  SCREENING FOR RED FLAGS: Bowel or bladder incontinence: No Spinal tumors: No Cauda equina syndrome: No Compression fracture: No Abdominal aneurysm: No  COGNITION: Overall cognitive status: Within functional limits for tasks assessed     SENSATION: Not tested  MUSCLE LENGTH:  Hip flexors moderate limitation B Quads severe limitation B HS groups moderate limitation B Piriformis severe limitation B   POSTURE: rounded shoulders, forward head, decreased lumbar lordosis, increased thoracic kyphosis, and flexed trunk     LUMBAR ROM:   AROM eval  Flexion Moderate limitation, improvement in pain   Extension Severe limitation  Right lateral flexion Moderate limitation   Left lateral flexion Moderate limitation   Right rotation Moderate limitation   Left rotation Moderate limitation    (Blank rows = not tested)    LOWER EXTREMITY MMT:    MMT Right eval Left eval  Hip flexion 4 4  Hip extension 2 2  Hip abduction 4 4  Hip adduction    Hip internal rotation    Hip external rotation    Knee flexion 4+ 4-  Knee extension 4 4  Ankle dorsiflexion 4+ 4  Ankle plantarflexion    Ankle inversion    Ankle eversion     (Blank rows = not tested)     TODAY'S TREATMENT:  DATE:                                     EXERCISE LOG      09-26-22  Exercise Repetitions and Resistance Comments       Left sdly position with folded pillow between knees for  comfort:  Combo e'stim/US at 1.50 W/CM2 x 12 minutes f/b STW/M to patient lumbar musculature and ischemic release technique to his right QL x 11 minutes f/b Pre-mod e'stim at 80-150 Hz x 12 minutes (5 sec on and 5 sec off).   PATIENT EDUCATION:  Education details: exam findings, POC, HEP  Person educated: Patient Education method: Explanation, Demonstration, and Handouts Education comprehension: verbalized understanding, returned demonstration, and needs further education  HOME EXERCISE PROGRAM: Access Code: [email or web address] URL: [email or web address] Date: 08/10/2022 Prepared by: Nicholas Yates  Exercises - Single Knee to Chest Stretch  - 2 x daily - 7 x weekly - 1 sets - 10 reps - 3 hold - Supine Lower Trunk Rotation  - 2 x daily - 7 x weekly - 1 sets - 10 reps - 5 hold - Supine Bridge  - 2 x daily - 7 x weekly - 1 sets - 10 reps - 2 hold  ASSESSMENT:  CLINICAL IMPRESSION:      Patient presenting to the clinic with an 8/10 pain-level.  Right lumbar musculature tender to palpation with an excellent response to treatment and feeling much better following.  OBJECTIVE IMPAIRMENTS: Abnormal gait, decreased mobility, difficulty walking, decreased ROM, decreased strength, increased fascial restrictions, increased muscle spasms, impaired flexibility, improper body mechanics, postural dysfunction, and pain.   ACTIVITY LIMITATIONS: carrying, lifting, sitting, standing, stairs, transfers, and locomotion level  PARTICIPATION LIMITATIONS: driving, shopping, community activity, and yard work  PERSONAL FACTORS: Age, Behavior pattern, Education, Fitness, Past/current experiences, and Time since onset of injury/illness/exacerbation are also affecting patient's functional outcome.   REHAB POTENTIAL: Good  CLINICAL DECISION MAKING: Stable/uncomplicated  EVALUATION COMPLEXITY: Low   GOALS: Goals reviewed with patient? Yes  SHORT TERM GOALS: Target date: 08/31/2022    Will be compliant  with appropriate progressive HEP  Baseline: Goal status: MET  2.  Lumbar ROM to have improved by 50% all planes of motion  Baseline:  Goal status: ON going  3.  LE muscle flexibility to have improved by 50%  Baseline:  Goal status: On going             4.  Will demonstrate good biomechanics for bed mobility and floor to waist height lifting  Baseline:  Goal status: ON going    LONG TERM GOALS: Target date: 09/21/2022    MMT to have improved by one grade in all weak groups and will score at least 20/24 on DGI Baseline:  Goal status: On going  2.  Pain to be no more than 3/10 at worst  Baseline:  Goal status: On going  3.  Will be able to return to all yardwork and work around the house without increase in pain  Baseline:  Goal status: On going  4.  FOTO score to have improved by at least 10 points to show subjective improvement  Baseline:  Goal status: On going  PLAN:  PT FREQUENCY: 2x/week  PT DURATION: 6 weeks  PLANNED INTERVENTIONS: Therapeutic exercises, Therapeutic activity, Balance training, Gait training, Patient/Family education, Self Care, and Joint mobilization.  PLAN FOR NEXT SESSION: No traction (not appropriate clinically  and per pt request); I. Otherwise work on lumbar ROM, LE flexibility, general LE and core strenth, biomechanics   Rexene Agent, PTA

## 2022-11-13 ENCOUNTER — Emergency Department (HOSPITAL_COMMUNITY)
Admission: EM | Admit: 2022-11-13 | Discharge: 2022-11-13 | Disposition: A | Discharge status: Home / Self Care | Payer: Medicare Other | Source: Home / Self Care | Attending: Emergency Medicine | Admitting: Emergency Medicine

## 2022-11-13 ENCOUNTER — Emergency Department (HOSPITAL_COMMUNITY): Payer: Medicare Other

## 2022-11-13 ENCOUNTER — Other Ambulatory Visit: Payer: Self-pay

## 2022-11-13 DIAGNOSIS — S299XXA Unspecified injury of thorax, initial encounter: Secondary | ICD-10-CM | POA: Diagnosis present

## 2022-11-13 DIAGNOSIS — S20229A Contusion of unspecified back wall of thorax, initial encounter: Secondary | ICD-10-CM | POA: Diagnosis not present

## 2022-11-13 DIAGNOSIS — S0990XA Unspecified injury of head, initial encounter: Secondary | ICD-10-CM | POA: Insufficient documentation

## 2022-11-13 DIAGNOSIS — M25512 Pain in left shoulder: Secondary | ICD-10-CM | POA: Diagnosis not present

## 2022-11-13 DIAGNOSIS — S2231XA Fracture of one rib, right side, initial encounter for closed fracture: Secondary | ICD-10-CM

## 2022-11-13 DIAGNOSIS — W11XXXA Fall on and from ladder, initial encounter: Secondary | ICD-10-CM | POA: Diagnosis not present

## 2022-11-13 DIAGNOSIS — S3991XA Unspecified injury of abdomen, initial encounter: Secondary | ICD-10-CM | POA: Insufficient documentation

## 2022-11-13 LAB — COMPREHENSIVE METABOLIC PANEL
ALT: 37 U/L (ref 0–44)
AST: 45 U/L — ABNORMAL HIGH (ref 15–41)
Albumin: 4.4 g/dL (ref 3.5–5.0)
Alkaline Phosphatase: 118 U/L (ref 38–126)
Anion gap: 11 (ref 5–15)
BUN: 38 mg/dL — ABNORMAL HIGH (ref 8–23)
CO2: 21 mmol/L — ABNORMAL LOW (ref 22–32)
Calcium: 9.2 mg/dL (ref 8.9–10.3)
Chloride: 105 mmol/L (ref 98–111)
Creatinine, Ser: 1.46 mg/dL — ABNORMAL HIGH (ref 0.61–1.24)
GFR, Estimated: 52 mL/min — ABNORMAL LOW (ref >60)
Glucose, Bld: 87 mg/dL (ref 70–99)
Potassium: 4.8 mmol/L (ref 3.5–5.1)
Sodium: 137 mmol/L (ref 135–145)
Total Bilirubin: 0.3 mg/dL (ref 0.3–1.2)
Total Protein: 7.9 g/dL (ref 6.5–8.1)

## 2022-11-13 LAB — CBC WITH DIFFERENTIAL/PLATELET
Abs Immature Granulocytes: 0.08 10*3/uL — ABNORMAL HIGH (ref 0.00–0.07)
Basophils Absolute: 0.1 10*3/uL (ref 0.0–0.1)
Basophils Relative: 1 %
Eosinophils Absolute: 0.1 10*3/uL (ref 0.0–0.5)
Eosinophils Relative: 1 %
HCT: 37.5 % — ABNORMAL LOW (ref 39.0–52.0)
Hemoglobin: 11.9 g/dL — ABNORMAL LOW (ref 13.0–17.0)
Immature Granulocytes: 1 %
Lymphocytes Relative: 14 %
Lymphs Abs: 1.4 10*3/uL (ref 0.7–4.0)
MCH: 32 pg (ref 26.0–34.0)
MCHC: 31.7 g/dL (ref 30.0–36.0)
MCV: 100.8 fL — ABNORMAL HIGH (ref 80.0–100.0)
Monocytes Absolute: 0.9 10*3/uL (ref 0.1–1.0)
Monocytes Relative: 9 %
Neutro Abs: 7.4 10*3/uL (ref 1.7–7.7)
Neutrophils Relative %: 74 %
Platelets: 375 10*3/uL (ref 150–400)
RBC: 3.72 MIL/uL — ABNORMAL LOW (ref 4.22–5.81)
RDW: 13.4 % (ref 11.5–15.5)
WBC: 10 10*3/uL (ref 4.0–10.5)
nRBC: 0 % (ref 0.0–0.2)

## 2022-11-13 LAB — TYPE AND SCREEN
ABO/RH(D): O POS
Antibody Screen: NEGATIVE

## 2022-11-13 MED ORDER — FENTANYL CITRATE (PF) 100 MCG/2ML IJ SOLN
100.0000 ug | Freq: Once | INTRAMUSCULAR | Status: AC
  Administered 2022-11-13: 100 ug via INTRAVENOUS
  Filled 2022-11-13: qty 2

## 2022-11-13 MED ORDER — IOHEXOL 300 MG/ML  SOLN
100.0000 mL | Freq: Once | INTRAMUSCULAR | Status: AC | PRN
  Administered 2022-11-13: 100 mL via INTRAVENOUS

## 2022-11-13 MED ORDER — OXYCODONE-ACETAMINOPHEN 5-325 MG PO TABS
2.0000 | ORAL_TABLET | Freq: Once | ORAL | Status: AC
  Administered 2022-11-13: 2 via ORAL
  Filled 2022-11-13: qty 2

## 2022-11-13 MED ORDER — OXYCODONE-ACETAMINOPHEN 5-325 MG PO TABS
1.0000 | ORAL_TABLET | Freq: Four times a day (QID) | ORAL | 0 refills | Status: AC | PRN
Start: 2022-11-13 — End: ?

## 2022-11-13 MED ORDER — NAPROXEN 500 MG PO TABS: 500.0000 mg | ORAL_TABLET | Freq: Two times a day (BID) | ORAL | 0 refills | Status: DC

## 2022-11-13 NOTE — ED Notes (Signed)
AC called for incentive spirometer.

## 2022-11-13 NOTE — ED Notes (Signed)
C-collar in place.  Pt placed on 2L [place] for comfort.   18 g R AC.

## 2022-11-13 NOTE — ED Notes (Signed)
Patient transported to CT 

## 2022-11-13 NOTE — ED Triage Notes (Signed)
Pt states he fell off ladder more than 20 ft up. Pt is not sure of loc. Pt c/o sob.

## 2022-11-13 NOTE — ED Provider Notes (Signed)
[place] EMERGENCY DEPARTMENT AT [facility] Provider Note   CSN: [id number] Arrival date & time: 11/13/22  1713     History  Chief Complaint  Patient presents with   Orvall Shinabarger is a 67 y.o. male.   Fall   67 year old male, history of high cholesterol, he is on Effexor, he is on Crestor, Carafate, Cialis, presents after falling approximately 25 feet with a ladder, fell to the side and landed on his back striking his back and his head on the ground.  He thinks that he was probably unconscious for a few seconds or even a minute.  He is complaining of severe pain in the left side of his chest worse with movement and palpation and breathing.  Denies changes in vision, no pain in his legs, he does have pain in the left shoulder.  He arrives by private vehicle    Home Medications Prior to Admission medications   Medication Sig Start Date End Date Taking? Authorizing Provider  escitalopram (LEXAPRO) 5 MG tablet Take 5 mg by mouth every morning. 11/10/22  Yes [provider]  hydrOXYzine (ATARAX) 10 MG tablet Take 10 mg by mouth 3 (three) times daily as needed. 11/10/22  Yes [provider]  ibuprofen (ADVIL) 800 MG tablet Take 800 mg by mouth 4 (four) times daily as needed. 09/21/22  Yes [provider]  lidocaine (HM LIDOCAINE PATCH) 4 % Place 1 patch onto the skin daily. 03/17/22  Yes [name] B, NP  mirtazapine (REMERON) 7.5 MG tablet Take 7.5 mg by mouth at bedtime. 07/25/22  Yes [provider]  naproxen (NAPROSYN) 500 MG tablet Take 1 tablet (500 mg total) by mouth 2 (two) times daily with a meal. 11/13/22  Yes Eber Hong, MD  omeprazole (PRILOSEC) 40 MG capsule Take 40 mg by mouth as needed (acid reflux). 08/31/22  Yes [provider]  oxyCODONE-acetaminophen (PERCOCET/ROXICET) 5-325 MG tablet Take 1 tablet by mouth every 6 (six) hours as needed for severe pain. 11/13/22  Yes Eber Hong, MD  SEROQUEL 25 MG tablet  Take 25 mg by mouth at bedtime. 11/10/22  Yes [provider]  tadalafil (CIALIS) 10 MG tablet Take 1 tablet (10 mg total) by mouth daily as needed for erectile dysfunction. 07/13/22  Yes [name], NP      Allergies    Patient has no known allergies.    Review of Systems   Review of Systems  All other systems reviewed and are negative.   Physical Exam Updated Vital Signs BP 131/77   Pulse 83   Temp 98.6 F (37 C) (Oral)   Resp 18   Ht 1.829 m (6')   Wt 84 kg   SpO2 99%   BMI 25.12 kg/m  Physical Exam Vitals and nursing note reviewed.  Constitutional:      General: He is in acute distress.     Appearance: He is well-developed. He is ill-appearing.  HENT:     Head: Normocephalic and atraumatic.     Comments: There is no hematoma or abrasion or laceration or contusion or ecchymosis around the head over the scalp or the face.    Mouth/Throat:     Pharynx: No oropharyngeal exudate.  Eyes:     General: No scleral icterus.       Right eye: No discharge.        Left eye: No discharge.     Conjunctiva/sclera: Conjunctivae normal.  Pupils: Pupils are equal, round, and reactive to light.  Neck:     Thyroid: No thyromegaly.     Vascular: No JVD.  Cardiovascular:     Rate and Rhythm: Normal rate and regular rhythm.     Heart sounds: Normal heart sounds. No murmur heard.    No friction rub. No gallop.  Pulmonary:     Effort: Pulmonary effort is normal. No respiratory distress.     Breath sounds: Normal breath sounds. No wheezing or rales.  Chest:     Chest wall: Tenderness present.  Abdominal:     General: Bowel sounds are normal. There is no distension.     Palpations: Abdomen is soft. There is no mass.     Tenderness: There is no abdominal tenderness.  Musculoskeletal:        General: Tenderness present.     Cervical back: Normal range of motion and neck supple.     Right lower leg: Edema present.     Left lower leg: No edema.     Comments: Tenderness  over cervical thoracic and lumbar spines, minimal  Lymphadenopathy:     Cervical: No cervical adenopathy.  Skin:    General: Skin is warm and dry.     Findings: No erythema or rash.  Neurological:     Mental Status: He is alert.     Coordination: Coordination normal.     Comments: Awake alert and able to follow commands, able to ambulate with significant tenderness secondary to his rib pain  Psychiatric:        Behavior: Behavior normal.     ED Results / Procedures / Treatments   Labs (all labs ordered are listed, but only abnormal results are displayed) Labs Reviewed  CBC WITH DIFFERENTIAL/PLATELET - Abnormal; Notable for the following components:      Result Value   RBC 3.72 (*)    Hemoglobin 11.9 (*)    HCT 37.5 (*)    MCV 100.8 (*)    Abs Immature Granulocytes 0.08 (*)    All other components within normal limits  COMPREHENSIVE METABOLIC PANEL - Abnormal; Notable for the following components:   CO2 21 (*)    BUN 38 (*)    Creatinine, Ser 1.46 (*)    AST 45 (*)    GFR, Estimated 52 (*)    All other components within normal limits  TYPE AND SCREEN    EKG None  Radiology CT CERVICAL SPINE WO CONTRAST  Result Date: 11/13/2022 CLINICAL DATA:  Fall off ladder, more than 20 feet, head and neck trauma EXAM: CT HEAD WITHOUT CONTRAST CT CERVICAL SPINE WITHOUT CONTRAST TECHNIQUE: Multidetector CT imaging of the head and cervical spine was performed following the standard protocol without intravenous contrast. Multiplanar CT image reconstructions of the cervical spine were also generated. RADIATION DOSE REDUCTION: This exam was performed according to the departmental dose-optimization program which includes automated exposure control, adjustment of the mA and/or kV according to patient size and/or use of iterative reconstruction technique. COMPARISON:  None Available. FINDINGS: CT HEAD FINDINGS Brain: No evidence of acute infarct, hemorrhage, mass, mass effect, or midline shift. No  hydrocephalus or extra-axial fluid collection. Vascular: No hyperdense vessel. Skull: Negative for fracture or focal lesion. Sinuses/Orbits: No acute finding. Other: The mastoid air cells are well aerated. CT CERVICAL SPINE FINDINGS Alignment: No traumatic listhesis. Skull base and vertebrae: No acute fracture or suspicious osseous lesion. Fusion of the right facets of C2 and C3. Soft tissues and spinal canal:  No prevertebral fluid or swelling. No visible canal hematoma. Disc levels: Degenerative changes in the cervical spine. Mild spinal canal stenosis at C5-C6 and C6-C7. Upper chest: For findings in the thorax, please see same day CT chest. IMPRESSION: 1. No acute intracranial process. 2. No acute fracture or traumatic listhesis in the cervical spine. Electronically Signed   By: Wiliam Ke M.D.   On: 11/13/2022 19:38   CT HEAD WO CONTRAST  Result Date: 11/13/2022 CLINICAL DATA:  Fall off ladder, more than 20 feet, head and neck trauma EXAM: CT HEAD WITHOUT CONTRAST CT CERVICAL SPINE WITHOUT CONTRAST TECHNIQUE: Multidetector CT imaging of the head and cervical spine was performed following the standard protocol without intravenous contrast. Multiplanar CT image reconstructions of the cervical spine were also generated. RADIATION DOSE REDUCTION: This exam was performed according to the departmental dose-optimization program which includes automated exposure control, adjustment of the mA and/or kV according to patient size and/or use of iterative reconstruction technique. COMPARISON:  None Available. FINDINGS: CT HEAD FINDINGS Brain: No evidence of acute infarct, hemorrhage, mass, mass effect, or midline shift. No hydrocephalus or extra-axial fluid collection. Vascular: No hyperdense vessel. Skull: Negative for fracture or focal lesion. Sinuses/Orbits: No acute finding. Other: The mastoid air cells are well aerated. CT CERVICAL SPINE FINDINGS Alignment: No traumatic listhesis. Skull base and vertebrae: No  acute fracture or suspicious osseous lesion. Fusion of the right facets of C2 and C3. Soft tissues and spinal canal: No prevertebral fluid or swelling. No visible canal hematoma. Disc levels: Degenerative changes in the cervical spine. Mild spinal canal stenosis at C5-C6 and C6-C7. Upper chest: For findings in the thorax, please see same day CT chest. IMPRESSION: 1. No acute intracranial process. 2. No acute fracture or traumatic listhesis in the cervical spine. Electronically Signed   By: Wiliam Ke M.D.   On: 11/13/2022 19:38   CT CHEST ABDOMEN PELVIS W CONTRAST  Result Date: 11/13/2022 CLINICAL DATA:  Fall from ladder over 20 feet with shortness of breath, initial encounter EXAM: CT CHEST, ABDOMEN, AND PELVIS WITH CONTRAST TECHNIQUE: Multidetector CT imaging of the chest, abdomen and pelvis was performed following the standard protocol during bolus administration of intravenous contrast. RADIATION DOSE REDUCTION: This exam was performed according to the departmental dose-optimization program which includes automated exposure control, adjustment of the mA and/or kV according to patient size and/or use of iterative reconstruction technique. CONTRAST:  OMNIPAQUE IOHEXOL 300 MG/ML  SOLN COMPARISON:  Chest x-ray from earlier in the same day. FINDINGS: CT CHEST FINDINGS Cardiovascular: Thoracic aorta and its branches are within normal limits. No acute injury is seen. No cardiac enlargement is noted. The pulmonary artery as visualized is within normal limits although not timed for embolus evaluation. Mediastinum/Nodes: Thoracic inlet is unremarkable. No hilar or mediastinal adenopathy is noted. The esophagus as visualized is within normal limits. Lungs/Pleura: Lungs demonstrate mild consolidation in the medial left lower lobe the lungs are otherwise well aerated. No effusion or sizable parenchymal nodule is noted. No pneumothorax is seen. Musculoskeletal: Old healed rib fractures are noted on the left.  Minimally displaced left eighth rib fracture is noted laterally. No complicating factors are seen. No other fractures noted. Degenerative change in the thoracic spine is noted. CT ABDOMEN PELVIS FINDINGS Hepatobiliary: Liver is within normal limits. Gallbladder is decompressed with a single gallstone within. Pancreas: Unremarkable. No pancreatic ductal dilatation or surrounding inflammatory changes. Spleen: Normal in size without focal abnormality. Adrenals/Urinary Tract: Adrenal glands are within normal limits. Kidneys demonstrate  a normal enhancement pattern bilaterally. No calculi are noted. No obstructive changes are seen. The bladder is within normal limits. Stomach/Bowel: Scattered diverticular change of the colon is noted. No evidence of diverticulitis is seen. No obstructive changes are noted. The appendix is within normal limits. Small bowel and stomach are unremarkable. Vascular/Lymphatic: No significant vascular findings are present. No enlarged abdominal or pelvic lymph nodes. Reproductive: Prostate is unremarkable. Other: No abdominal wall hernia or abnormality. No abdominopelvic ascites. Musculoskeletal: Bilateral hip replacements are noted. Changes of prior vertebral augmentation are noted. Degenerative changes are seen. IMPRESSION: CT of the chest: Left eighth rib fracture without complicating factors. Left lower lobe consolidation medially of uncertain chronicity. CT of the abdomen and pelvis: Cholelithiasis without complicating factors. Diverticulosis without diverticulitis. No other focal abnormality is seen. Electronically Signed   By: Alcide Clever M.D.   On: 11/13/2022 19:35   DG Shoulder Left  Result Date: 11/13/2022 CLINICAL DATA:  Trauma, fall from ladder, pain. EXAM: LEFT SHOULDER - 2+ VIEW COMPARISON:  None Available. FINDINGS: There is no evidence of fracture or dislocation. Minor acromioclavicular degenerative change. There is no evidence of arthropathy or other focal bone  abnormality. Soft tissues are unremarkable. IMPRESSION: No fracture or subluxation of the left shoulder. Minor acromioclavicular degenerative change. Electronically Signed   By: Narda Rutherford M.D.   On: 11/13/2022 19:04   DG Pelvis Portable  Result Date: 11/13/2022 CLINICAL DATA:  Trauma, fall from ladder.  Pain. EXAM: PORTABLE PELVIS 1-2 VIEWS COMPARISON:  04/17/2022 FINDINGS: Bilateral hip arthroplasties. Distal stems not included in the field of view. No evidence of periprosthetic lucency. No arthroplasty dislocation. Presumed heterotopic calcification about both greater trochanters. Intact pubic rami. No pubic symphyseal or sacroiliac diastasis. L5 vertebral augmentation. IMPRESSION: No pelvic fracture. Bilateral hip arthroplasties, intact where included. Electronically Signed   By: Narda Rutherford M.D.   On: 11/13/2022 19:02   DG Chest Port 1 View  Result Date: 11/13/2022 CLINICAL DATA:  Trauma, fall off ladder.  Shortness of breath. EXAM: PORTABLE CHEST 1 VIEW COMPARISON:  Chest radiograph 02/12/2022 FINDINGS: Lung volumes are low. Upper normal heart size. Normal mediastinal contours for technique. No pneumothorax or large pleural effusion. Diffuse interstitial coarsening. Remote distal right clavicle fracture. No grossly displaced rib fracture. IMPRESSION: Low lung volumes.  No evidence of traumatic injury. Electronically Signed   By: Narda Rutherford M.D.   On: 11/13/2022 19:01    Procedures Procedures    Medications Ordered in ED Medications  oxyCODONE-acetaminophen (PERCOCET/ROXICET) 5-325 MG per tablet 2 tablet (has no administration in time range)  fentaNYL (SUBLIMAZE) injection 100 mcg (100 mcg Intravenous Given 11/13/22 1848)  iohexol (OMNIPAQUE) 300 MG/ML solution 100 mL (100 mLs Intravenous Contrast Given 11/13/22 1812)    ED Course/ Medical Decision Making/ A&P                                 Medical Decision Making Amount and/or Complexity of Data Reviewed Labs:  ordered. Radiology: ordered.  Risk Prescription drug management.    This patient presents to the ED for concern of injury after a significant fall, this involves an extensive number of treatment options, and is a complaint that carries with it a high risk of complications and morbidity.  The differential diagnosis includes rib fracture, pneumothorax, head injury, neck pain, spinal fractures   Co morbidities that complicate the patient evaluation  Age,   Additional history obtained:  Additional history  obtained from significant other at the bedside External records from outside source obtained and reviewed including what happened today, gave story, history   Lab Tests:  I Ordered, and personally interpreted labs.  The pertinent results include: CBC unremarkable mild renal insufficiency   Imaging Studies ordered:  I ordered imaging studies including isolated rib fracture left eighth rib, no pneumothorax or other significant injuries in the head cervical spine chest abdomen or pelvis I independently visualized and interpreted imaging which showed isolated rib fracture I agree with the radiologist interpretation   Cardiac Monitoring: / EKG:  The patient was maintained on a cardiac monitor.  I personally viewed and interpreted the cardiac monitored which showed an underlying rhythm of: Sinus rhythm    Problem List / ED Course / Critical interventions / Medication management  Patient informed of results, given pain medication, given incentive spirometer, stable for discharge to follow-up in outpatient setting, no hypoxia, no signs of pulmonary contusion clinically I ordered medication including fentanyl and Percocet for pain Reevaluation of the patient after these medicines showed that the patient improved I have reviewed the patients home medicines and have made adjustments as needed   Social Determinants of Health:  None   Test / Admission - Considered:  Considered  admission but no other significant findings to necessitate admission to the hospital   I have discussed with the patient at the bedside the results, and the meaning of these results.  They have expressed her understanding to the need for follow-up with primary care physician         Final Clinical Impression(s) / ED Diagnoses Final diagnoses:  Closed fracture of one rib of right side, initial encounter  Minor head injury, initial encounter  Contusion of back, unspecified laterality, initial encounter    Rx / DC Orders ED Discharge Orders          Ordered    naproxen (NAPROSYN) 500 MG tablet  2 times daily with meals        11/13/22 1948    oxyCODONE-acetaminophen (PERCOCET/ROXICET) 5-325 MG tablet  Every 6 hours PRN        11/13/22 1948              Eber Hong, MD 11/13/22 1952

## 2022-11-13 NOTE — ED Notes (Signed)
Pt instructed how to use incentive spirometer, verbalized understanding. Proper use demonstrated by pt.

## 2022-11-13 NOTE — Discharge Instructions (Signed)
Your testing reviewed and shows that you only have 1 rib fracture, no other significant injuries from the fall, you will likely have lots of pain from the bruising.  You may have trouble breathing - ER for worsening breathing problems.    Please take Naprosyn, 500mg  by mouth twice daily as needed for pain - this in an antiinflammatory medicine (NSAID) and is similar to ibuprofen - many people feel that it is stronger than ibuprofen and it is easier to take since it is a smaller pill.  Please use this only for 1 week - if your pain persists, you will need to follow up with your doctor in the office for ongoing guidance and pain control.  Percocet one tablet every 6 hours as needed for severe pain..  It may make you nauseated or constipated, follow-up with your doctor in 1 week for recheck, use the breathing machine that we have given you every hour for the next few days to help keep your lungs expanded

## 2022-11-16 ENCOUNTER — Emergency Department (HOSPITAL_COMMUNITY): Payer: Medicare Other

## 2022-11-16 ENCOUNTER — Encounter (HOSPITAL_COMMUNITY): Payer: Self-pay

## 2022-11-16 ENCOUNTER — Emergency Department (HOSPITAL_COMMUNITY)
Admission: EM | Admit: 2022-11-16 | Discharge: 2022-11-16 | Disposition: A | Discharge status: Home / Self Care | Payer: Medicare Other | Attending: Emergency Medicine | Admitting: Emergency Medicine

## 2022-11-16 ENCOUNTER — Other Ambulatory Visit: Payer: Self-pay

## 2022-11-16 DIAGNOSIS — W19XXXA Unspecified fall, initial encounter: Secondary | ICD-10-CM | POA: Insufficient documentation

## 2022-11-16 DIAGNOSIS — S2243XA Multiple fractures of ribs, bilateral, initial encounter for closed fracture: Secondary | ICD-10-CM

## 2022-11-16 DIAGNOSIS — S2242XA Multiple fractures of ribs, left side, initial encounter for closed fracture: Secondary | ICD-10-CM | POA: Diagnosis not present

## 2022-11-16 DIAGNOSIS — S2241XA Multiple fractures of ribs, right side, initial encounter for closed fracture: Secondary | ICD-10-CM | POA: Insufficient documentation

## 2022-11-16 DIAGNOSIS — S20301A Unspecified superficial injuries of right front wall of thorax, initial encounter: Secondary | ICD-10-CM | POA: Diagnosis present

## 2022-11-16 MED ORDER — OXYCODONE HCL 5 MG PO TABS
5.0000 mg | ORAL_TABLET | ORAL | 0 refills | Status: DC | PRN
Start: 2022-11-16 — End: 2022-11-23

## 2022-11-16 MED ORDER — HYDROMORPHONE HCL 1 MG/ML IJ SOLN
1.0000 mg | Freq: Once | INTRAMUSCULAR | Status: AC
  Administered 2022-11-16: 1 mg via INTRAMUSCULAR
  Filled 2022-11-16: qty 1

## 2022-11-16 MED ORDER — ACETAMINOPHEN 325 MG PO TABS: 650.0000 mg | ORAL_TABLET | Freq: Three times a day (TID) | ORAL | 0 refills | Status: AC

## 2022-11-16 MED ORDER — MELOXICAM 15 MG PO TABS: 15.0000 mg | ORAL_TABLET | Freq: Every day | ORAL | 0 refills | Status: DC

## 2022-11-16 MED ORDER — OMEPRAZOLE 20 MG PO CPDR: 20.0000 mg | DELAYED_RELEASE_CAPSULE | Freq: Every day | ORAL | 0 refills | Status: DC

## 2022-11-16 MED ORDER — OXYCODONE-ACETAMINOPHEN 5-325 MG PO TABS
2.0000 | ORAL_TABLET | Freq: Once | ORAL | Status: AC
  Administered 2022-11-16: 2 via ORAL
  Filled 2022-11-16: qty 2

## 2022-11-16 MED ORDER — KETOROLAC TROMETHAMINE 60 MG/2ML IM SOLN
30.0000 mg | Freq: Once | INTRAMUSCULAR | Status: AC
  Administered 2022-11-16: 30 mg via INTRAMUSCULAR
  Filled 2022-11-16: qty 2

## 2022-11-16 NOTE — ED Triage Notes (Addendum)
Pt had fall few days ago and was diagnosed with right rib fracture. States pain has gotten worse and is radiating to his back.

## 2022-11-16 NOTE — ED Notes (Signed)
MD at bedside updating pt

## 2022-11-16 NOTE — ED Provider Notes (Signed)
[place] EMERGENCY DEPARTMENT AT [facility] Provider Note   CSN: [id number] Arrival date & time: 11/16/22  0500     History {Add pertinent medical, surgical, social history, OB history to HPI:1} Chief Complaint  Patient presents with   Rib Fracture    Nicholas Yates is a 67 y.o. male.  HPI     Home Medications Prior to Admission medications   Medication Sig Start Date End Date Taking? Authorizing Provider  acetaminophen (TYLENOL) 325 MG tablet Take 2 tablets (650 mg total) by mouth in the morning, at noon, and at bedtime. 11/16/22 12/16/22 Yes Rockland Kotarski, Barbara Cower, MD  meloxicam (MOBIC) 15 MG tablet Take 1 tablet (15 mg total) by mouth daily. 11/16/22 12/16/22 Yes Blase Beckner, Barbara Cower, MD  omeprazole (PRILOSEC) 20 MG capsule Take 1 capsule (20 mg total) by mouth daily. 11/16/22 12/16/22 Yes Odies Desa, Barbara Cower, MD  oxyCODONE (ROXICODONE) 5 MG immediate release tablet Take 1-2 tablets (5-10 mg total) by mouth every 4 (four) hours as needed for severe pain or breakthrough pain. 11/16/22  Yes Laiden Milles, Barbara Cower, MD  escitalopram (LEXAPRO) 5 MG tablet Take 5 mg by mouth every morning. 11/10/22   [provider]  hydrOXYzine (ATARAX) 10 MG tablet Take 10 mg by mouth 3 (three) times daily as needed. 11/10/22   [provider]  lidocaine (HM LIDOCAINE PATCH) 4 % Place 1 patch onto the skin daily. 03/17/22   [name], NP  mirtazapine (REMERON) 7.5 MG tablet Take 7.5 mg by mouth at bedtime. 07/25/22   [provider]  SEROQUEL 25 MG tablet Take 25 mg by mouth at bedtime. 11/10/22   [provider]  tadalafil (CIALIS) 10 MG tablet Take 1 tablet (10 mg total) by mouth daily as needed for erectile dysfunction. 07/13/22   [name], NP      Allergies    Patient has no known allergies.    Review of Systems   Review of Systems  Physical Exam Updated Vital Signs BP 129/74   Pulse 63   Temp 97.6 F (36.4 C)   Resp 19   SpO2 100%  Physical Exam  ED Results /  Procedures / Treatments   Labs (all labs ordered are listed, but only abnormal results are displayed) Labs Reviewed - No data to display  EKG None  Radiology DG Ribs Unilateral W/Chest Right  Result Date: 11/16/2022 CLINICAL DATA:  67 year old male status post fall earlier this week with left 8th rib fracture. Increasing rib pain, including right lower rib pain. EXAM: RIGHT RIBS AND CHEST - 3+ VIEW COMPARISON:  Portable chest and CT Chest, Abdomen, and Pelvis 11/13/2022. FINDINGS: PA view of the chest at 0603 hours. Improved lung volumes. Mediastinal contours are stable. Tortuous descending thoracic aorta. Visualized tracheal air column is within normal limits. No pneumothorax, pulmonary edema or pleural effusion. No consolidation. Three oblique views of the right ribs. Evidence of minimally displaced anterior right 8th and 9th rib fractures, nondisplaced and subtle on the recent CT. No other acute right rib fracture identified. Chronic distal right clavicle fracture and stable other visualized osseous structures. Negative visible bowel gas. IMPRESSION: 1. Minimally displaced anterior right 8th and 9th rib fractures. No other acute right rib fracture identified. 2. No acute cardiopulmonary abnormality. Electronically Signed   By: Odessa Fleming M.D.   On: 11/16/2022 06:38    Procedures Procedures  {Document cardiac monitor, telemetry assessment procedure when appropriate:1}  Medications Ordered in ED Medications  oxyCODONE-acetaminophen (PERCOCET/ROXICET) 5-325 MG per tablet 2  tablet (2 tablets Oral Given 11/16/22 0531)  HYDROmorphone (DILAUDID) injection 1 mg (1 mg Intramuscular Given 11/16/22 0102)  ketorolac (TORADOL) injection 30 mg (30 mg Intramuscular Given 11/16/22 7253)    ED Course/ Medical Decision Making/ A&P   {   Click here for ABCD2, HEART and other calculatorsREFRESH Note before signing :1}                              Medical Decision Making Amount and/or Complexity of Data  Reviewed Radiology: ordered.  Risk OTC drugs. Prescription drug management.   ***  {Document critical care time when appropriate:1} {Document review of labs and clinical decision tools ie heart score, Chads2Vasc2 etc:1}  {Document your independent review of radiology images, and any outside records:1} {Document your discussion with family members, caretakers, and with consultants:1} {Document social determinants of health affecting pt's care:1} {Document your decision making why or why not admission, treatments were needed:1} Final Clinical Impression(s) / ED Diagnoses Final diagnoses:  Closed fracture of multiple ribs of both sides, initial encounter    Rx / DC Orders ED Discharge Orders          Ordered    meloxicam (MOBIC) 15 MG tablet  Daily        11/16/22 0748    acetaminophen (TYLENOL) 325 MG tablet  3 times daily        11/16/22 0748    oxyCODONE (ROXICODONE) 5 MG immediate release tablet  Every 4 hours PRN        11/16/22 0748    omeprazole (PRILOSEC) 20 MG capsule  Daily        11/16/22 0748

## 2022-11-16 NOTE — ED Notes (Signed)
Patient Alert and oriented to baseline. Stable and ambulatory to baseline. Patient verbalized understanding of the discharge instructions.  Patient belongings were taken by the patient.   

## 2022-11-22 DIAGNOSIS — M8008XG Age-related osteoporosis with current pathological fracture, vertebra(e), subsequent encounter for fracture with delayed healing: Secondary | ICD-10-CM | POA: Insufficient documentation

## 2022-11-23 ENCOUNTER — Telehealth: Payer: Self-pay | Admitting: Family Medicine

## 2022-11-23 ENCOUNTER — Other Ambulatory Visit: Payer: Self-pay | Admitting: Family Medicine

## 2022-11-23 ENCOUNTER — Ambulatory Visit (INDEPENDENT_AMBULATORY_CARE_PROVIDER_SITE_OTHER): Payer: Medicare Other | Admitting: Family Medicine

## 2022-11-23 VITALS — BP 103/60 | HR 80 | Ht 72.0 in | Wt 209.0 lb

## 2022-11-23 DIAGNOSIS — S2243XA Multiple fractures of ribs, bilateral, initial encounter for closed fracture: Secondary | ICD-10-CM

## 2022-11-23 DIAGNOSIS — G8929 Other chronic pain: Secondary | ICD-10-CM

## 2022-11-23 DIAGNOSIS — M545 Low back pain, unspecified: Secondary | ICD-10-CM

## 2022-11-23 MED ORDER — MELOXICAM 15 MG PO TABS
15.0000 mg | ORAL_TABLET | Freq: Every day | ORAL | 0 refills | Status: DC
Start: 2022-11-23 — End: 2023-02-07

## 2022-11-23 MED ORDER — OXYCODONE HCL 5 MG PO TABS
5.0000 mg | ORAL_TABLET | Freq: Four times a day (QID) | ORAL | 0 refills | Status: AC | PRN
Start: 2022-11-23 — End: 2022-11-28

## 2022-11-23 NOTE — Progress Notes (Signed)
Acute Office Visit  Subjective:     Patient ID: Nicholas Yates, male    DOB: 1955/04/03, 67 y.o.   MRN: [id number]  Chief Complaint  Patient presents with   Hospitalization Follow-up    HPI Patient is in today for ED follow-up.   Discussed the use of AI scribe software for clinical note transcription with the patient, who gave verbal consent to proceed.  History of Present Illness   The patient, with a history of chronic back pain, presents for a follow-up visit after a fall from a ladder approximately 30 feet high. The fall resulted in one rib fracture on the left and two on the right. The patient initially visited the emergency room the day of the fall 11/13/22, where only the left rib fracture was identified. However, due to persistent and worsening pain, the patient returned to the emergency room a few days later (11/16/22), where the two additional fractures on the right were discovered. The patient reports severe pain, particularly when breathing and coughing, and is unable to lay on either side due to discomfort.  In addition to the acute pain from the rib fractures, the patient continues to struggle with chronic back pain. They have attempted to manage this pain independently with over-the-counter medications, but these methods have proven ineffective. The patient is seeking a referral back to pain management for this ongoing issue.  The patient acknowledges the need for caution in their activities, recognizing that their fall was a result of risky behavior. They express a desire to manage their pain in a healthier manner moving forward. The patient has been using an incentive spirometer regularly and reports no significant mucus production or other respiratory issues. They have also been taking MiraLax to counteract the constipating effects of the oxycodone prescribed for pain management.            ROS All review of systems negative except what is listed in the HPI       Objective:    BP 103/60   Pulse 80   Ht 6' (1.829 m)   Wt 209 lb (94.8 kg)   SpO2 99%   BMI 28.35 kg/m    Physical Exam Vitals reviewed.  Constitutional:      General: He is not in acute distress.    Appearance: Normal appearance. He is not ill-appearing.  Cardiovascular:     Rate and Rhythm: Normal rate and regular rhythm.  Pulmonary:     Effort: Pulmonary effort is normal. No respiratory distress.     Breath sounds: Normal breath sounds. No wheezing, rhonchi or rales.  Musculoskeletal:        General: No swelling or tenderness. Normal range of motion.     Cervical back: Normal range of motion and neck supple.  Skin:    General: Skin is warm and dry.  Neurological:     General: No focal deficit present.     Mental Status: He is alert and oriented to person, place, and time. Mental status is at baseline.  Psychiatric:        Mood and Affect: Mood normal.        Behavior: Behavior normal.        Thought Content: Thought content normal.        Judgment: Judgment normal.     No results found for any visits on 11/23/22.      Assessment & Plan:   Problem List Items Addressed This Visit       Active  Problems   Chronic back pain - Primary    History of chronic back pain, exacerbated by recent fall. Over-reliance on over-the-counter medications and alcohol for pain management. -Advised he stop using alcohol, especially while on narcotics. -Refer back to pain management for further evaluation and management. -Short term refill of Oxycodone for acute exacerbation of pain due to recent trauma.      Relevant Medications   meloxicam (MOBIC) 15 MG tablet   oxyCODONE (ROXICODONE) 5 MG immediate release tablet   Other Relevant Orders   Ambulatory referral to Pain Clinic   Other Visit Diagnoses     Closed fracture of multiple ribs of both sides, initial encounter     Fall from a ladder with resultant rib fractures (one on the left, two on the right). Painful with  movement, coughing, and deep breathing. No signs of respiratory compromise. -Continue Oxycodone 5mg , 1-2 tablets every 6 hours as needed for pain. Limit to 5 days - he is aware to use sparingly, as I will not be refilling this.  -Continue Meloxicam once daily for inflammation. -Advise to brace with a pillow when coughing or sneezing. -Encourage use of incentive spirometer to prevent atelectasis.    Relevant Medications   meloxicam (MOBIC) 15 MG tablet   oxyCODONE (ROXICODONE) 5 MG immediate release tablet   Other Relevant Orders   Ambulatory referral to Pain Clinic       Meds ordered this encounter  Medications   meloxicam (MOBIC) 15 MG tablet    Sig: Take 1 tablet (15 mg total) by mouth daily.    Dispense:  30 tablet    Refill:  0   oxyCODONE (ROXICODONE) 5 MG immediate release tablet    Sig: Take 1-2 tablets (5-10 mg total) by mouth every 6 (six) hours as needed for up to 5 days for severe pain or breakthrough pain.    Dispense:  40 tablet    Refill:  0    Return if symptoms worsen or fail to improve.  [name], NP

## 2022-11-23 NOTE — Telephone Encounter (Signed)
Pt called stating that there was an issue with getting his meloxicam from the pharmacy. Pt stated he had tried to follow up with them to see what the issue was but was unable to reach them. Pt would like a call back with our findings when available.

## 2022-11-23 NOTE — Assessment & Plan Note (Signed)
History of chronic back pain, exacerbated by recent fall. Over-reliance on over-the-counter medications and alcohol for pain management. -Advised he stop using alcohol, especially while on narcotics. -Refer back to pain management for further evaluation and management. -Short term refill of Oxycodone for acute exacerbation of pain due to recent trauma.

## 2022-11-24 NOTE — Telephone Encounter (Signed)
Called patient to get more information, he states it has been taken care of and he has gotten his medication. Will call with any other issues.

## 2022-11-28 ENCOUNTER — Telehealth: Payer: Self-pay | Admitting: Family Medicine

## 2022-11-28 ENCOUNTER — Encounter: Payer: Self-pay | Admitting: Family Medicine

## 2022-11-28 DIAGNOSIS — M545 Low back pain, unspecified: Secondary | ICD-10-CM

## 2022-11-28 NOTE — Telephone Encounter (Signed)
Pt called & stated that he was advised that he cannot schedule an appointment for his pain medicine referral until after October 7th. Pt would like referral to be sent to a different office so he can be seen sooner. Please call & advise pt.

## 2022-11-28 NOTE — Telephone Encounter (Signed)
Tried to call patient back to let him know, per Ladona Ridgel, he will need to call different pain management offices and if they can see him prior to October 7th we can send referral to the office of his choice. No answer and no voicemail. Will try again later.    Messages from Referral:  [name], NP sent to Silvio Pate, CMA Can you give him a call and ask him to schedule with his pain management provider? See below.       Previous Messages    ----- Message ----- From: Delaine Lame Sent: 11/25/2022   3:18 PM EDT To: [name], NP  Thank you for the referral, patient is already an existing patient of Dr. Shearon Stalls.  Patient saw her on 05/02/22.  If he needs to make an appointment, have him call office for a follow up.  Dr. Shearon Stalls is out of the office the month of September and will be back in October.

## 2022-11-29 NOTE — Telephone Encounter (Signed)
Pt called to follow up because he hadn't heard anything. Advised that CMA responded via mychart this morning. Read message to patient. He will call that office back

## 2022-11-29 NOTE — Telephone Encounter (Signed)
Please advise if you can refill Meloxicam until appointment.

## 2022-11-29 NOTE — Telephone Encounter (Signed)
Patient wrote message via Earleen Reaper and information relayed through messages.

## 2022-11-29 NOTE — Telephone Encounter (Signed)
He needs to be aware that he shouldn't be using a lot of NSAIDs given history given ulceration and GI bleed history. Once in awhile might be okay, but I don't want to use it constantly. I can let him try occasional tramadol in the meantime if he's interested.

## 2022-11-29 NOTE — Telephone Encounter (Signed)
Pt said he called [facility] and their next opening would be after Oct. They suggested that he call us back and see if Ladona Ridgel can continue refilling his meloxicam prescription until he can be seen on 10/7. Please call him to advise.

## 2022-12-01 MED ORDER — ACETAMINOPHEN-CODEINE 300-30 MG PO TABS
1.0000 | ORAL_TABLET | ORAL | 0 refills | Status: AC | PRN
Start: 2022-12-01 — End: 2022-12-06

## 2022-12-14 ENCOUNTER — Telehealth: Payer: Self-pay

## 2022-12-14 NOTE — Telephone Encounter (Signed)
Taleah passed this call to me due to him being upset about his care. He states that [employer] helping him with his chronic back pain and resent broken ribs. Patient was yelling and wanting to know what we were going to do about it. He states that he has been to the ED for this and pain management (which they will not see him anymore and is upset that [facility] does care about him. He also stated that Ladona Ridgel has not answered his MyChart message. I offered to take a message for him to send to [facility], schedule an appt with Ladona Ridgel, he just laughed and said what good is that going to do. I then asked what I could do to help you and I want to help you. He stated what good are you, you can't help me, do what you think you need to do and asked for a number to the complaint department. I gave them the number patient experience and he disconnect the call.

## 2022-12-26 ENCOUNTER — Encounter: Payer: Self-pay | Admitting: Physical Medicine and Rehabilitation

## 2022-12-26 ENCOUNTER — Encounter
Payer: Medicare Other | Attending: Physical Medicine and Rehabilitation | Admitting: Physical Medicine and Rehabilitation

## 2022-12-26 VITALS — BP 134/84 | HR 66 | Ht 72.0 in | Wt 206.0 lb

## 2022-12-26 DIAGNOSIS — G894 Chronic pain syndrome: Secondary | ICD-10-CM | POA: Diagnosis present

## 2022-12-26 DIAGNOSIS — Z79891 Long term (current) use of opiate analgesic: Secondary | ICD-10-CM | POA: Insufficient documentation

## 2022-12-26 DIAGNOSIS — M7918 Myalgia, other site: Secondary | ICD-10-CM | POA: Diagnosis not present

## 2022-12-26 DIAGNOSIS — Z5181 Encounter for therapeutic drug level monitoring: Secondary | ICD-10-CM | POA: Diagnosis present

## 2022-12-26 DIAGNOSIS — F1010 Alcohol abuse, uncomplicated: Secondary | ICD-10-CM | POA: Insufficient documentation

## 2022-12-26 DIAGNOSIS — F1990 Other psychoactive substance use, unspecified, uncomplicated: Secondary | ICD-10-CM | POA: Diagnosis present

## 2022-12-26 DIAGNOSIS — M545 Low back pain, unspecified: Secondary | ICD-10-CM | POA: Diagnosis not present

## 2022-12-26 MED ORDER — TIZANIDINE HCL 2 MG PO TABS: 2.0000 mg | ORAL_TABLET | Freq: Every evening | ORAL | 1 refills | Status: DC | PRN

## 2022-12-26 NOTE — Patient Instructions (Addendum)
Patient has agreed to abstain from alcohol while under controlled substance contract with our office.  He is agreeable to monthly follow-ups and both random and routine urine drug screens to ensure compliance.  He states it has been 6 days since he has had any alcohol; urine drug screen performed today, if this is negative for any controlled substance we will initiate Norco 5 mg 1/2 to 1 tablet 3 times daily as needed with plan to wean down over the next 3 months to minimum tolerable dosing.  We are also asking that you stop Goody powder and ibuprofen due to your history of gastrointestinal bleeds while on this medication.  Today, I feel that your pain is mostly muscular rather than arthritic, however in the future we may consider referral to Dr. Wynn Banker for medial branch blocks if arthritic component becomes worse.  I prescribed a muscle relaxant tizanidine 2 mg, which you can take at night as needed for back pain.  I have referred you to aqua therapy at [facility] to help with stretching, range of motion, and pain control.  Follow-up with our nurse practitioner Riley Lam every month, and myself in 3 months for repeat evaluation.

## 2022-12-26 NOTE — Progress Notes (Signed)
Subjective:    Patient ID: Nicholas Yates, male    DOB: 1955-12-14, 67 y.o.   MRN: [id number]  HPI   Nicholas Yates is a 67 y.o. year old male  who  has a past medical history of Depression, GERD (gastroesophageal reflux disease), and Post traumatic stress disorder.   They are presenting to PM&R clinic for follow up related to chronic low back pain .  Plan from last visit:  Chronic pain syndrome Assessment & Plan: Indication for chronic opioid: Chronic low back pain Medication and dose: Oxycodone-acetaminophen 5 mg  # pills per month: #30 tabs for 2 weeks to start Last UDS date: none-patient recently drank alcohol, will return in 2 weeks for UDS   Opioid Treatment Agreement signed (Y/N): 05/02/22 Opioid Treatment Agreement last reviewed with patient:   NCCSRS/PDMP reviewed this encounter (include red flags): Yes    Management will include: Low Risk (<10 MME) UDS every 6-12 months NCCSR check every visit Follow up Q3M initially, Q6M once established      Acquired leg length discrepancy Assessment & Plan: D/t prior L hip replacement   Script for custom heel lift given to be filled at [facility]; advised patient to get this so he can use it with therapies to improve his gait mechanics     Bilateral low back pain without sciatica, unspecified chronicity Assessment & Plan: Based on exam,  likely more facet arthritis etiology, no radicular symptoms on exam despite MRI showing some areas of neuroforaminal stenosis.    I have prescribed you Norco 5 mg #30 tablets for 2 weeks. Take 0.5-1 tab every 8 hours for moderate to severe pain. For mild pain, you can take Tylenol 500 mg up to three times daily; do not exceed this dose.      Facet arthritis of lumbar region Assessment & Plan: Likely etiology of back pain based on exam findings   MRI 04/2021 showing mild endplate fractures of L5, T11 and L1; L1 ankylosis;  mild retrolisthesis L5/S1, L2/3, and L1/2; and multilevel bulging discs  and facet spurring with notable neuroforaminal narrowing at L L2/3 and Bilateral L5/S1.    Pain medication started as above.    Patient has current order for PT. He will go through this and, if no improvement at follow ups, we can discuss referral for [facility]BB     Myofascial pain Assessment & Plan: Follow up in 2 weeks for trigger point injections     ETOH abuse Assessment & Plan: Patient in process of cutting back gradually from 5-6 beers per day to 1 beer per day. No Hx withdrawal, no current s/e.   Discussed need to abstain from alcohol use with pain contract. Patient will take 2 weeks to wean off 1 beer per day completely, then come back for UDS.          Excessive use of nonsteroidal anti-inflammatory drug (NSAID) Assessment & Plan: STOP goody powder with initiation of Norco script; patient in agreement    Interval Hx:  - Therapies: He cannot get out of bed or even put socks on most days because of pain. Pain is much worse since his fall. He called to get in with PT but was asked for a referral. He is concerned about OOP costs with PT.    - Follow ups: Recent L rib fx following fall from 20 ft ladder. At follow up found to have R rib fractures as well. Says he was cutting some limbs and he fell off 11/12/2022.  He does feel like ribs are getting better, slowly. He fell directly on his back and does feel his chronic back pain has been exacerbated since the fall.    - DME: none currently; does furniture walking.    - Medications: On Mobic 15 mg daily - stopped, now taking ibuprofen 600 mg three times daily plus goody powder for pain control, which is "tearing my stomach up". and tylenol PRN. Lido patch PRN for rib pain.   Stopped tylenol because they were ineffective. He uses lidocaine patches occassionally but has trouble keeping them on.   Per chart, has been on tramadol, oxycodone, and now tylenol-codeine with Dr. Reola Calkins while waiting for appt. He denies any side effects to  medication, but states he didn't want to get acclimated to daily medications. He does not have any current pain medications.   Regarding pain control, "I have drank too much. I only got short term scripts". States he is drinking 1-2 beers once or twice per week to help with pain.   Was on flexeril in the past, without benefit.   - Other concerns: Notes his left leg is shorter than his right leg, but uses a heel lift.   Pain Inventory Average Pain 9 Pain Right Now 10 My pain is constant, sharp, stabbing, and aching  In the last 24 hours, has pain interfered with the following? General activity 6 Relation with others 8 Enjoyment of life 9 What TIME of day is your pain at its worst? morning  Sleep (in general) Poor  Pain is worse with: walking, bending, and some activites Pain improves with: medication Relief from Meds: 10  Family History  Problem Relation Age of Onset   Colon cancer Neg Hx    Esophageal cancer Neg Hx    Inflammatory bowel disease Neg Hx    Liver disease Neg Hx    Pancreatic cancer Neg Hx    Rectal cancer Neg Hx    Stomach cancer Neg Hx    Social History   Socioeconomic History   Marital status: Single    Spouse name: Not on file   Number of children: Not on file   Years of education: Not on file   Highest education level: GED or equivalent  Occupational History   Not on file  Tobacco Use   Smoking status: Never   Smokeless tobacco: Never  Vaping Use   Vaping status: Never Used  Substance and Sexual Activity   Alcohol use: Not Currently    Alcohol/week: 42.0 standard drinks of alcohol    Types: 42 Cans of beer per week    Comment: daily   Drug use: Not Currently    Types: Marijuana   Sexual activity: Yes  Other Topics Concern   Not on file  Social History Narrative   Not on file   Social Determinants of Health   Financial Resource Strain: High Risk (11/23/2022)   Overall Financial Resource Strain (CARDIA)    Difficulty of Paying Living  Expenses: Hard  Food Insecurity: Patient Declined (11/23/2022)   Hunger Vital Sign    Worried About Running Out of Food in the Last Year: Patient declined    Ran Out of Food in the Last Year: Patient declined  Transportation Needs: No Transportation Needs (11/23/2022)   PRAPARE - Administrator, Civil Service (Medical): No    Lack of Transportation (Non-Medical): No  Physical Activity: Sufficiently Active (11/23/2022)   Exercise Vital Sign    Days of Exercise per  Week: 3 days    Minutes of Exercise per Session: 50 min  Stress: Stress Concern Present (11/23/2022)   [employer] of Occupational Health - Occupational Stress Questionnaire    Feeling of Stress : Rather much  Social Connections: Unknown (11/23/2022)   Social Connection and Isolation Panel [NHANES]    Frequency of Communication with Friends and Family: Patient declined    Frequency of Social Gatherings with Friends and Family: Patient declined    Attends Religious Services: Patient declined    Active Member of Clubs or Organizations: No    Attends Engineer, structural: Not on file    Marital Status: Living with partner   Past Surgical History:  Procedure Laterality Date   BIOPSY  02/13/2022   Procedure: BIOPSY;  Surgeon: Meridee Score, Netty Starring., MD;  Location: [facility];  Service: Gastroenterology;;   ESOPHAGOGASTRODUODENOSCOPY (EGD) WITH PROPOFOL N/A 02/13/2022   Procedure: ESOPHAGOGASTRODUODENOSCOPY (EGD) WITH PROPOFOL;  Surgeon: Lemar Lofty., MD;  Location: [facility];  Service: Gastroenterology;  Laterality: N/A;   HERNIA REPAIR     TOTAL HIP ARTHROPLASTY     bilateral   Past Surgical History:  Procedure Laterality Date   BIOPSY  02/13/2022   Procedure: BIOPSY;  Surgeon: Meridee Score Netty Starring., MD;  Location: [facility];  Service: Gastroenterology;;   ESOPHAGOGASTRODUODENOSCOPY (EGD) WITH PROPOFOL N/A 02/13/2022   Procedure: ESOPHAGOGASTRODUODENOSCOPY (EGD) WITH PROPOFOL;   Surgeon: Lemar Lofty., MD;  Location: [facility];  Service: Gastroenterology;  Laterality: N/A;   HERNIA REPAIR     TOTAL HIP ARTHROPLASTY     bilateral   Past Medical History:  Diagnosis Date   Depression    GERD (gastroesophageal reflux disease)    had duodenal ulcer 5 months   Post traumatic stress disorder    BP 134/84   Pulse 66   Ht 6' (1.829 m)   Wt 206 lb (93.4 kg)   SpO2 98%   BMI 27.94 kg/m   Opioid Risk Score:   Fall Risk Score:  `1  Depression screen [facility] 2/9     05/02/2022   10:30 AM 02/16/2022   11:32 AM 11/01/2021    3:14 PM 04/29/2021    2:55 PM 04/26/2021    4:53 PM 04/02/2021   11:20 AM 12/07/2020   10:08 AM  Depression screen PHQ 2/9  Decreased Interest 0 0 2 2 0 0 1  Down, Depressed, Hopeless 0 0 1 1 0 0 1  PHQ - 2 Score 0 0 3 3 0 0 2  Altered sleeping 0 3 3 1   1   Tired, decreased energy 0 3 2 3   1   Change in appetite 0 2 2 2   1   Feeling bad or failure about yourself  0 0 1 0   0  Trouble concentrating 0 0 1 1   0  Moving slowly or fidgety/restless 0 0 0 1   0  Suicidal thoughts 0 0 0 0   0  PHQ-9 Score 0 8 12 11   5   Difficult doing work/chores Not difficult at all Not difficult at all Very difficult Very difficult       Review of Systems  Musculoskeletal:  Positive for back pain.  All other systems reviewed and are negative.      Objective:   Physical Exam   PE: Constitution: Appropriate appearance for age. No apparent distress  Resp: No respiratory distress. No accessory muscle usage. on RA and CTAB Cardio: Well perfused appearance. No peripheral  edema. Abdomen: Nondistended. Nontender.   Psych: Appropriate mood and affect. Neuro: AAOx4. No apparent cognitive deficits   Neurologic Exam:   DTRs: Reflexes were 2+ in bilateral achilles, patella, biceps, BR and triceps. Babinsky: flexor responses b/l.   Hoffmans: negative b/l Sensory exam: revealed normal sensation in all dermatomal regions in bilateral upper extremities  and bilateral lower extremities Motor exam: strength 5/5 throughout bilateral upper extremities and bilateral lower extremities Coordination: Fine motor coordination was normal.   Gait: +antalgic gait   Back Exam:   Inspection: Pelvis was  even;+ leg length discrepancy, unchanged.  Lumbar lordotic curvature was   wnl .  There was  no evidence of scoliosis.   ROM revealed restricted ROM in back extension and flexion.  Special/provocative testing:    SLR: -   Slump test: -    Facet loading: +(very non-specific)   TTP at paraspinals: +(sensitive for facet pain...if no ttp then likely not facet pain)   Information in () parenthesis is normals/details of specific exam.       Assessment & Plan:  Nicholas Yates is a 67 y.o. year old male  who  has a past medical history of Depression, GERD (gastroesophageal reflux disease), and Post traumatic stress disorder.   They are presenting to PM&R clinic for follow up related to chronic low back pain .  Chronic pain syndrome Encounter for long-term use of opiate analgesic Encounter for medication monitoring Patient has agreed to abstain from alcohol while under controlled substance contract with our office.  He is agreeable to monthly follow-ups and both random and routine urine drug screens to ensure compliance.   Urine drug screen performed today, if this is negative for any substance we will initiate Norco 5 mg 1/2 to 1 tablet 3 times daily as needed with plan to wean down over the next 3 months to minimum tolerable dosing.  Follow-up with our nurse practitioner Nicholas Yates every month, and myself in 3 months for repeat evaluation.  Bilateral low back pain without sciatica, unspecified chronicity Mild bilateral foraminal narrowing Myofascial pain -     Ambulatory referral to Physical Therapy  MRI lumbar spine 2/23:  Slight retrolisthesis at L5-S1, L2-3, and L1-2. L5, L1 and T11 inferior endplate fractures  L2-L3 Mild left foraminal  narrowing L2-S1 disc bulging, facet spurring/narrowing  Today, I feel that your pain is mostly muscular rather than arthritic, however in the future we may consider referral to Dr. Wynn Banker for medial branch blocks if arthritic component becomes worse.  I prescribed a muscle relaxant tizanidine 2 mg, which you can take at night as needed for back pain.  I have referred you to aqua therapy at [facility] to help with stretching, range of motion, and pain control.  ETOH abuse He states it has been 6 days since he has had any alcohol; agreeable to abstinence from alcohol while under medication contract.  ** UDS + alcohol, significant amount, despite endorsed abstinence for several days (later did endorse one mixed drink day prior but screened amount exceeds expected from this). See notation from NP [facility].   Excessive use of nonsteroidal anti-inflammatory drug (NSAID) We are also asking that you stop Goody powder and ibuprofen due to your history of gastrointestinal bleeds while on controlled medication.   Other orders -     tiZANidine HCl; Take 1 tablet (2 mg total) by mouth at bedtime as needed for muscle spasms.  Dispense: 30 tablet; Refill: 1

## 2022-12-28 ENCOUNTER — Telehealth: Payer: Self-pay | Admitting: Specialist

## 2022-12-28 NOTE — Telephone Encounter (Signed)
Patient called 2 times on 12/27/22 wants to discuss his visit from 10/7 and his medications.  Please call. Shirlean Mylar, MHA, OT/L [phone]

## 2022-12-29 LAB — TOXASSURE SELECT,+ANTIDEPR,UR

## 2022-12-29 NOTE — Telephone Encounter (Signed)
Patient called again this morning asking if labs were in that he is needing to get medication.

## 2022-12-30 ENCOUNTER — Telehealth: Payer: Self-pay | Admitting: Registered Nurse

## 2022-12-30 NOTE — Telephone Encounter (Signed)
Mr. Nicholas Yates called our office this morning asking about his medication. Dr Shearon Stalls note was reviewed and UDS was reviewed.  +ETOH This provider was in contact with Dr Shearon Stalls regarding the above. Mr. Nicholas Yates will not be prescribed medication until his UDS is consistent. This provider called Mr. Nicholas Yates, no answer, left message to return the call.

## 2023-01-02 ENCOUNTER — Telehealth: Payer: Self-pay | Admitting: Registered Nurse

## 2023-01-02 NOTE — Telephone Encounter (Signed)
Spoke with Mr. Nicholas Yates about his UDS +ETOH. This provider was in contact with Dr Shearon Stalls last week. He will not be prescribed medication until he has no ETOH in his UDS. This was discussed with Mr. Nicholas Yates. He reports he hasn't had any alcohol for a week. He was instructed to come to office for a UDS, He reports he will be starting a new job on Wednesday, he was instructed to come today or Tuesday, he verbalizes understanding. He states he will try t find transportation.

## 2023-01-23 ENCOUNTER — Encounter: Payer: Medicare Other | Attending: Registered Nurse | Admitting: Registered Nurse

## 2023-01-23 DIAGNOSIS — M7918 Myalgia, other site: Secondary | ICD-10-CM | POA: Insufficient documentation

## 2023-01-23 DIAGNOSIS — Z5181 Encounter for therapeutic drug level monitoring: Secondary | ICD-10-CM | POA: Insufficient documentation

## 2023-01-23 DIAGNOSIS — Z79891 Long term (current) use of opiate analgesic: Secondary | ICD-10-CM | POA: Insufficient documentation

## 2023-01-23 DIAGNOSIS — F1990 Other psychoactive substance use, unspecified, uncomplicated: Secondary | ICD-10-CM | POA: Insufficient documentation

## 2023-01-23 DIAGNOSIS — G894 Chronic pain syndrome: Secondary | ICD-10-CM | POA: Insufficient documentation

## 2023-01-23 DIAGNOSIS — F1010 Alcohol abuse, uncomplicated: Secondary | ICD-10-CM | POA: Insufficient documentation

## 2023-01-23 DIAGNOSIS — M545 Low back pain, unspecified: Secondary | ICD-10-CM | POA: Insufficient documentation

## 2023-02-06 ENCOUNTER — Other Ambulatory Visit: Payer: Self-pay | Admitting: Family Medicine

## 2023-02-06 DIAGNOSIS — S2243XA Multiple fractures of ribs, bilateral, initial encounter for closed fracture: Secondary | ICD-10-CM

## 2023-02-06 DIAGNOSIS — G8929 Other chronic pain: Secondary | ICD-10-CM

## 2023-02-07 ENCOUNTER — Encounter: Payer: Self-pay | Admitting: Family Medicine

## 2023-02-07 ENCOUNTER — Ambulatory Visit (INDEPENDENT_AMBULATORY_CARE_PROVIDER_SITE_OTHER): Payer: Medicare Other | Admitting: Family Medicine

## 2023-02-07 VITALS — BP 139/80 | HR 82 | Ht 72.0 in | Wt 207.1 lb

## 2023-02-07 DIAGNOSIS — F1010 Alcohol abuse, uncomplicated: Secondary | ICD-10-CM

## 2023-02-07 DIAGNOSIS — E038 Other specified hypothyroidism: Secondary | ICD-10-CM

## 2023-02-07 DIAGNOSIS — M5489 Other dorsalgia: Secondary | ICD-10-CM

## 2023-02-07 DIAGNOSIS — E7849 Other hyperlipidemia: Secondary | ICD-10-CM

## 2023-02-07 DIAGNOSIS — E559 Vitamin D deficiency, unspecified: Secondary | ICD-10-CM

## 2023-02-07 DIAGNOSIS — R7301 Impaired fasting glucose: Secondary | ICD-10-CM

## 2023-02-07 DIAGNOSIS — Z114 Encounter for screening for human immunodeficiency virus [HIV]: Secondary | ICD-10-CM

## 2023-02-07 DIAGNOSIS — G8929 Other chronic pain: Secondary | ICD-10-CM

## 2023-02-07 DIAGNOSIS — Z1159 Encounter for screening for other viral diseases: Secondary | ICD-10-CM

## 2023-02-07 NOTE — Assessment & Plan Note (Addendum)
The patient reports a longstanding history of chronic low back pain. He admits to consuming 12 ounces of beer twice a week, with the most recent consumption on 02/06/2023. The patient states that he has no intention of quitting alcohol, as he believes it helps manage his lower back pain.  He mentioned that he was previously following up with pain management but stopped following up due to the distance. I reviewed the patient's records with his previous provider, who opted not to prescribe oxycodone due to concerns about the increased risk of drug addiction.  The patient expressed dissatisfaction with the healthcare system due to his inability to access pain medications. I offered an apology for his frustration but informed him that I recommend alcohol cessation due to the risks associated with combining narcotics and alcohol, including enhanced sedative effects and increased risk of central nervous system depression.  Upon being informed that narcotics cannot be prescribed while he is drinking alcohol, the patient expressed dissatisfaction with the recommendation and promptly left the exam room.

## 2023-02-07 NOTE — Assessment & Plan Note (Addendum)
The patient reports consuming 12 ounces of beer twice a week, with the most recent consumption on 02/06/2023. He states that he has no intention of quitting alcohol, as he believes it helps manage his lower back pain.  The patient is requesting narcotics for pain management, but I recommended alcohol cessation due to the risks associated with using narcotics and alcohol together, such as enhanced sedative effects and increased risk of central nervous system depression.  When informed that narcotics cannot be prescribed while the patient is drinking alcohol, he expressed dissatisfaction with the recommendation and promptly left the exam room.

## 2023-02-07 NOTE — Progress Notes (Signed)
New Patient Office Visit  Subjective:  Patient ID: Nicholas Yates, male    DOB: 04/20/1955  Age: 67 y.o. MRN: [id number]  CC:  Chief Complaint  Patient presents with   New Patient (Initial Visit)    Establishing care, reports chronic back pain, and deals with pain in multiple areas.     HPI Nicholas Yates is a 67 y.o. male with past medical history of GERD, duodenal ulcer, gastric ulcer, diverticulosis, degenerative disc disease of the lumbar spine, alcohol abuse, chronic back pain presents for establishing care. For the details of today's visit, please refer to the assessment and plan.    Past Medical History:  Diagnosis Date   Depression    GERD (gastroesophageal reflux disease)    had duodenal ulcer 5 months   Post traumatic stress disorder     Past Surgical History:  Procedure Laterality Date   BIOPSY  02/13/2022   Procedure: BIOPSY;  Surgeon: Meridee Score Netty Starring., MD;  Location: [facility];  Service: Gastroenterology;;   ESOPHAGOGASTRODUODENOSCOPY (EGD) WITH PROPOFOL N/A 02/13/2022   Procedure: ESOPHAGOGASTRODUODENOSCOPY (EGD) WITH PROPOFOL;  Surgeon: Lemar Lofty., MD;  Location: [facility];  Service: Gastroenterology;  Laterality: N/A;   HERNIA REPAIR     TOTAL HIP ARTHROPLASTY     bilateral    Family History  Problem Relation Age of Onset   Colon cancer Neg Hx    Esophageal cancer Neg Hx    Inflammatory bowel disease Neg Hx    Liver disease Neg Hx    Pancreatic cancer Neg Hx    Rectal cancer Neg Hx    Stomach cancer Neg Hx     Social History   Socioeconomic History   Marital status: Single    Spouse name: Not on file   Number of children: Not on file   Years of education: Not on file   Highest education level: GED or equivalent  Occupational History   Not on file  Tobacco Use   Smoking status: Never   Smokeless tobacco: Never  Vaping Use   Vaping status: Never Used  Substance and Sexual Activity   Alcohol use: Not Currently     Alcohol/week: 42.0 standard drinks of alcohol    Types: 42 Cans of beer per week    Comment: daily   Drug use: Not Currently    Types: Marijuana   Sexual activity: Yes  Other Topics Concern   Not on file  Social History Narrative   Not on file   Social Determinants of Health   Financial Resource Strain: Medium Risk (02/06/2023)   Overall Financial Resource Strain (CARDIA)    Difficulty of Paying Living Expenses: Somewhat hard  Food Insecurity: No Food Insecurity (02/06/2023)   Hunger Vital Sign    Worried About Running Out of Food in the Last Year: Never true    Ran Out of Food in the Last Year: Never true  Transportation Needs: No Transportation Needs (02/06/2023)   PRAPARE - Administrator, Civil Service (Medical): No    Lack of Transportation (Non-Medical): No  Physical Activity: Sufficiently Active (02/06/2023)   Exercise Vital Sign    Days of Exercise per Week: 5 days    Minutes of Exercise per Session: 30 min  Stress: Stress Concern Present (02/06/2023)   [employer] of Occupational Health - Occupational Stress Questionnaire    Feeling of Stress : Rather much  Social Connections: Socially Isolated (02/06/2023)   Social Connection and Isolation Panel [NHANES]  Frequency of Communication with Friends and Family: Once a week    Frequency of Social Gatherings with Friends and Family: Never    Attends Religious Services: Never    Database administrator or Organizations: No    Attends Engineer, structural: Not on file    Marital Status: Living with partner  Intimate Partner Violence: Not At Risk (07/31/2022)   Received from [facility]   HITS    Over the last 12 months how often did your partner physically hurt you?: Never    Over the last 12 months how often did your partner insult you or talk down to you?: Never    Over the last 12 months how often did your partner threaten you with physical harm?: Never    Over the last 12  months how often did your partner scream or curse at you?: Never    ROS Review of Systems  Constitutional:  Negative for fatigue and fever.  Eyes:  Negative for visual disturbance.  Respiratory:  Negative for chest tightness and shortness of breath.   Cardiovascular:  Negative for chest pain and palpitations.  Neurological:  Negative for dizziness and headaches.    Objective:   Today's Vitals: BP 139/80   Pulse 82   Ht 6' (1.829 m)   Wt 207 lb 1.3 oz (93.9 kg)   SpO2 98%   BMI 28.09 kg/m   Physical Exam HENT:     Head: Normocephalic.  Neurological:     Mental Status: He is alert.     Coordination: Coordination normal.     Gait: Gait normal.      Assessment & Plan:   Other chronic back pain Assessment & Plan: The patient reports a longstanding history of chronic low back pain. He admits to consuming 12 ounces of beer twice a week, with the most recent consumption on 02/06/2023. The patient states that he has no intention of quitting alcohol, as he believes it helps manage his lower back pain.  He mentioned that he was previously following up with pain management but stopped following up due to the distance. I reviewed the patient's records with his previous provider, who opted not to prescribe oxycodone due to concerns about the increased risk of drug addiction.  The patient expressed dissatisfaction with the healthcare system due to his inability to access pain medications. I offered an apology for his frustration but informed him that I recommend alcohol cessation due to the risks associated with combining narcotics and alcohol, including enhanced sedative effects and increased risk of central nervous system depression.  Upon being informed that narcotics cannot be prescribed while he is drinking alcohol, the patient expressed dissatisfaction with the recommendation and promptly left the exam room.         ETOH abuse Assessment & Plan: The patient reports  consuming 12 ounces of beer twice a week, with the most recent consumption on 02/06/2023. He states that he has no intention of quitting alcohol, as he believes it helps manage his lower back pain.  The patient is requesting narcotics for pain management, but I recommended alcohol cessation due to the risks associated with using narcotics and alcohol together, such as enhanced sedative effects and increased risk of central nervous system depression.  When informed that narcotics cannot be prescribed while the patient is drinking alcohol, he expressed dissatisfaction with the recommendation and promptly left the exam room.    IFG (impaired fasting glucose) -  Hemoglobin A1c  Vitamin D deficiency -     VITAMIN D 25 Hydroxy (Vit-D Deficiency, Fractures)  Need for hepatitis C screening test -     Hepatitis C antibody  Encounter for screening for HIV -     HIV Antibody (routine testing w rflx)  TSH (thyroid-stimulating hormone deficiency) -     TSH + free T4  Other hyperlipidemia -     Lipid panel -     CMP14+EGFR -     CBC with Differential/Platelet    Note: This chart has been completed using Engineer, civil (consulting) software, and while attempts have been made to ensure accuracy, certain words and phrases may not be transcribed as intended.   Follow-up: No follow-ups on file.   Gilmore Laroche, FNP

## 2023-03-04 ENCOUNTER — Other Ambulatory Visit: Payer: Self-pay | Admitting: Physical Medicine and Rehabilitation

## 2023-03-09 ENCOUNTER — Other Ambulatory Visit: Payer: Self-pay | Admitting: Family Medicine

## 2023-03-09 DIAGNOSIS — M545 Low back pain, unspecified: Secondary | ICD-10-CM

## 2023-03-09 DIAGNOSIS — S2243XA Multiple fractures of ribs, bilateral, initial encounter for closed fracture: Secondary | ICD-10-CM

## 2023-07-03 DIAGNOSIS — G8929 Other chronic pain: Secondary | ICD-10-CM | POA: Diagnosis not present

## 2023-07-03 DIAGNOSIS — M25512 Pain in left shoulder: Secondary | ICD-10-CM | POA: Diagnosis not present

## 2023-07-03 DIAGNOSIS — M19012 Primary osteoarthritis, left shoulder: Secondary | ICD-10-CM | POA: Diagnosis not present

## 2023-07-03 DIAGNOSIS — M5412 Radiculopathy, cervical region: Secondary | ICD-10-CM | POA: Diagnosis not present

## 2023-07-21 DIAGNOSIS — M75102 Unspecified rotator cuff tear or rupture of left shoulder, not specified as traumatic: Secondary | ICD-10-CM | POA: Diagnosis not present

## 2023-07-21 DIAGNOSIS — M19012 Primary osteoarthritis, left shoulder: Secondary | ICD-10-CM | POA: Diagnosis not present

## 2023-08-15 IMAGING — MR MR LUMBAR SPINE W/O CM
5 series · 31 of 48 positions shown · non-contrast
Comparison: Radiography 11/11/2019

CLINICAL DATA: Low back pain with symptoms persisting over 6 weeks.
Chronic back pain for 5 years

EXAM:
MRI LUMBAR SPINE WITHOUT CONTRAST
TECHNIQUE: Multiplanar, multisequence MR imaging of the lumbar spine was
performed. No intravenous contrast was administered.

[Series 5: T2 · sagittal · 4.0mm · 0.68mm/px · 6 of 15 slices shown (1 of 2)]
[im 1/15]
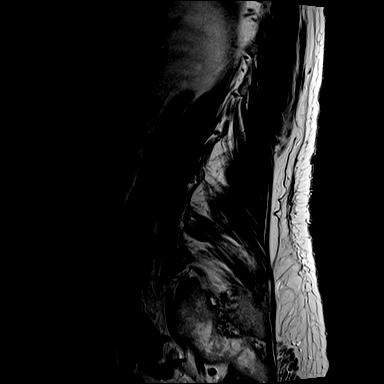
[im 3/15]
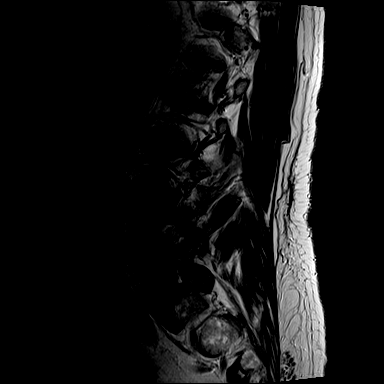
[im 6/15]
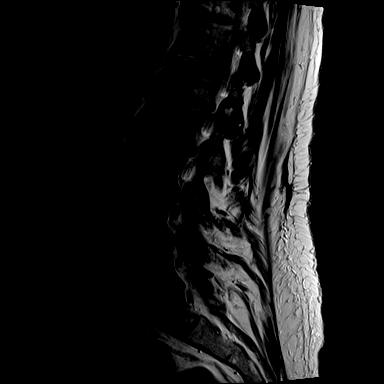
[im 9/15]
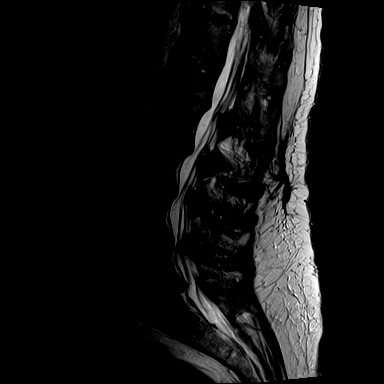
[im 12/15]
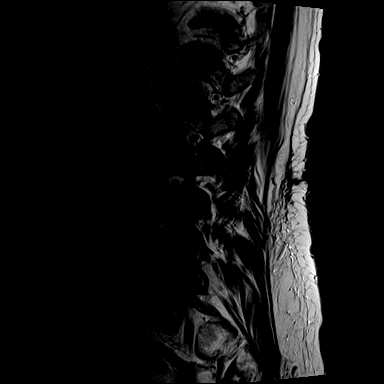
[im 15/15]
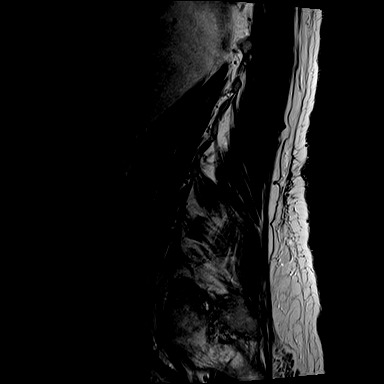

[Series 6: T1 · sagittal · 4.0mm · 0.81mm/px · 6 of 15 slices shown (1 of 2)]
[im 1/15]
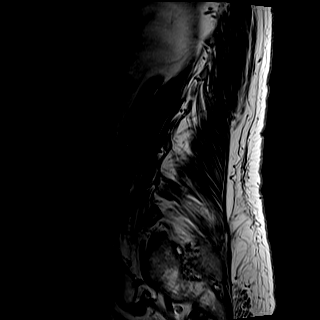
[im 3/15]
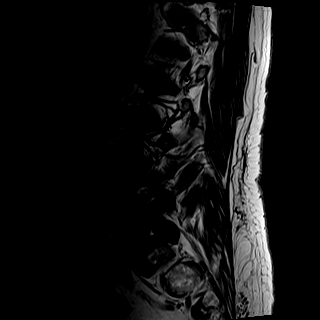
[im 6/15]
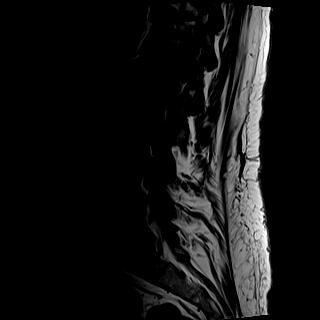
[im 9/15]
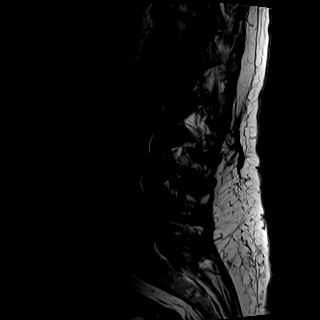
[im 12/15]
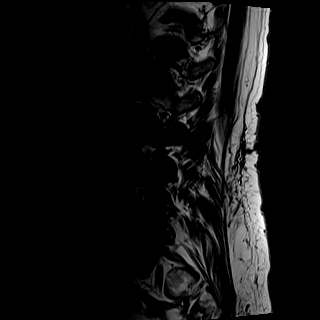
[im 15/15]
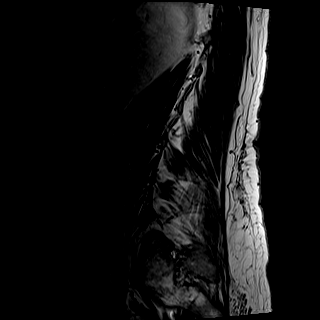

[Series 7: STIR · sagittal · 4.0mm · 0.51mm/px · 1 of 15 slices shown]
[im 1/15]
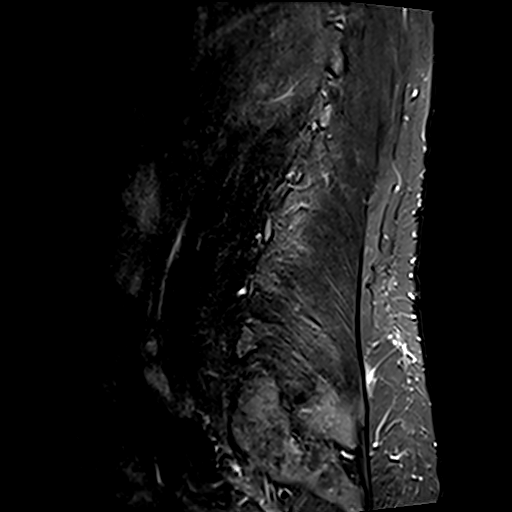

[Series 8: T2 · axial · 4.0mm · 0.70mm/px · z∈[-59,+150]mm · 9 of 36 slices shown (2 of 2)]
[im 1/36]
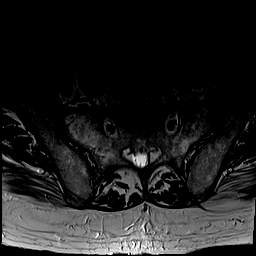
[im 6/36]
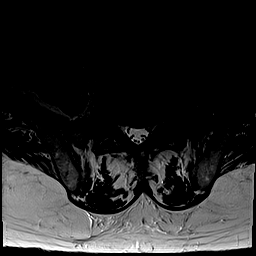
[im 11/36]
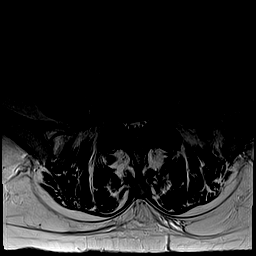
[im 16/36]
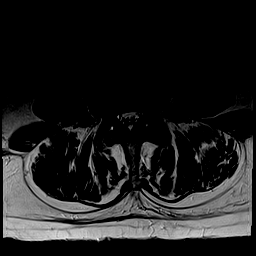
[im 18/36]
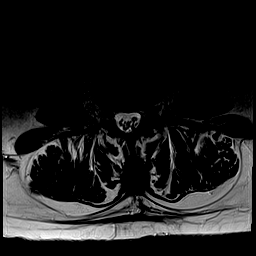
[im 21/36]
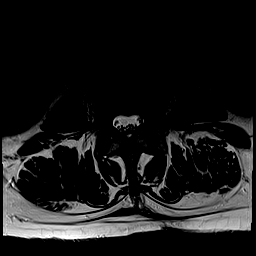
[im 26/36]
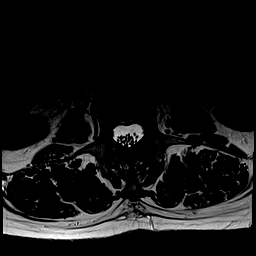
[im 31/36]
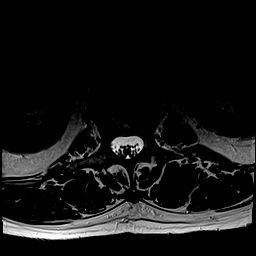
[im 36/36]
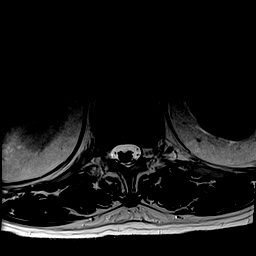

[Series 9: T1 · axial · 4.0mm · 0.35mm/px · z∈[-59,+150]mm · 9 of 36 slices shown (2 of 2)]
[im 1/36]
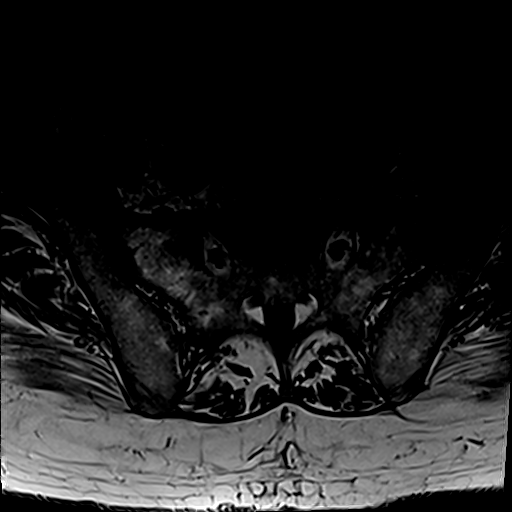
[im 6/36]
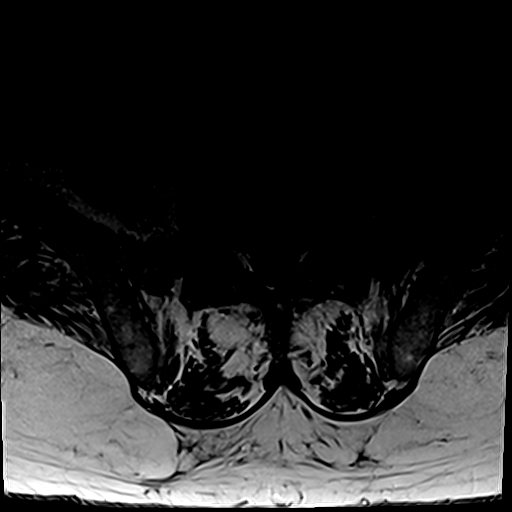
[im 11/36]
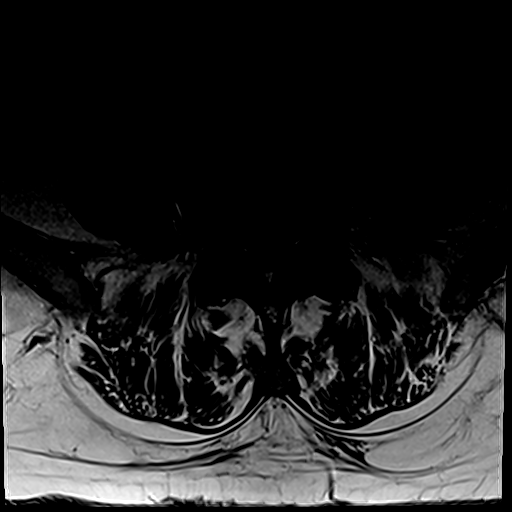
[im 16/36]
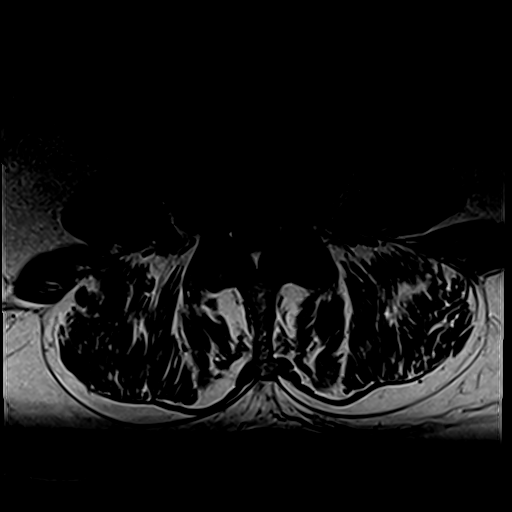
[im 18/36]
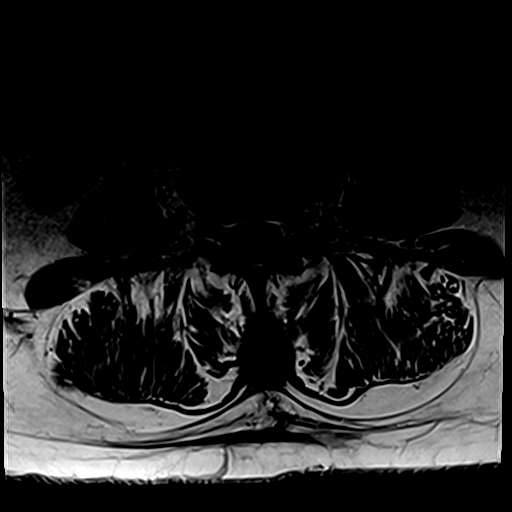
[im 21/36]
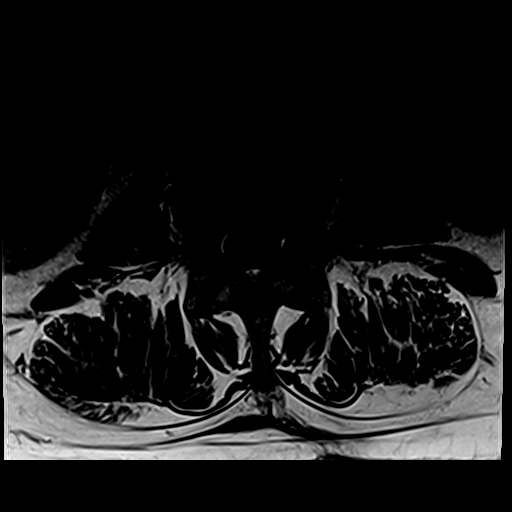
[im 26/36]
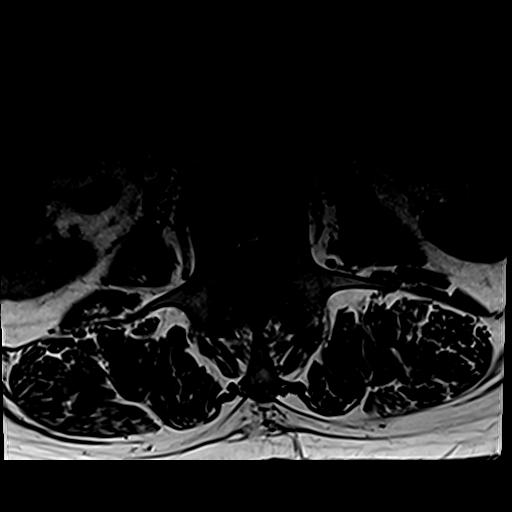
[im 31/36]
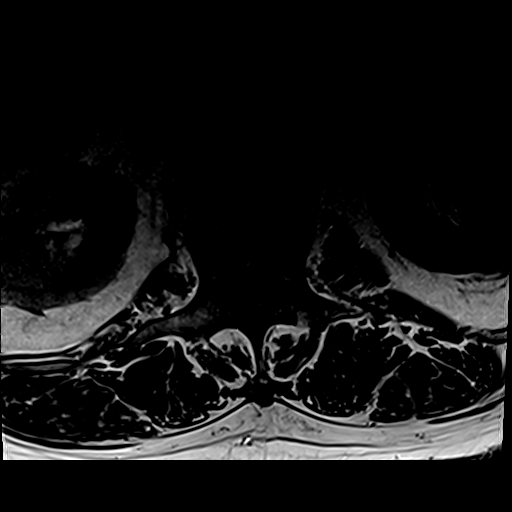
[im 36/36]
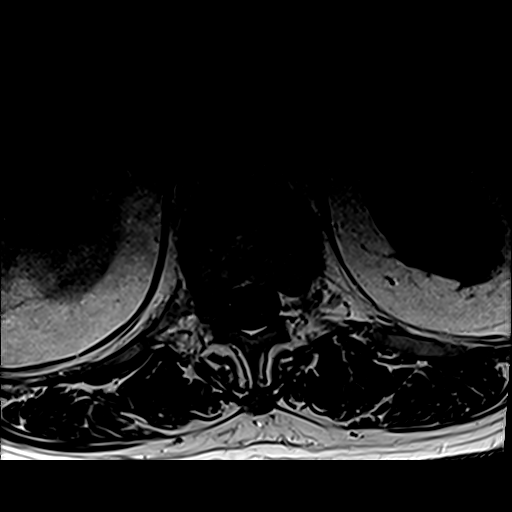

[31 of 48 positions shown; findings below may reference images not displayed]

FINDINGS: Segmentation:  5 lumbar type vertebrae

Alignment:  Slight retrolisthesis at L5-S1, L2-3, and L1-2.

Vertebrae: Marrow edema in the lower half of the L5 body where there
is a hypointense fracture plane best seen right parasagittal
following the inferior endplate, with mild inferior endplate
depression. No visible bone lesion. Remote superior endplate
fracture of L1 and inferior endplate fracture of T11.

Conus medullaris and cauda equina: Conus extends to the L1 level.
Conus and cauda equina appear normal.

Paraspinal and other soft tissues: Negative for perispinal mass or
inflammation

Disc levels:

T12- L1: Probable intervertebral ankylosis related to remote L1
superior endplate fracture

L1-L2: Disc narrowing and endplate degeneration with disc bulging.
Mild facet spurring

L2-L3: Disc narrowing and endplate degeneration with bulging into
the inferior foramina. Asymmetric left facet spurring. Mild left
foraminal narrowing

L3-L4: Disc narrowing and bulging. Facet spurring and ligamentum
flavum thickening. Patent canal and foramina

L4-L5: Disc narrowing and bulging. Degenerative facet spurring and
ligamentous thickening. Noncompressive thecal sac narrowing. Patent
foramina

L5-S1:Disc narrowing and bulging with degenerative facet spurring.
Mild bilateral foraminal narrowing
IMPRESSION: 1. Acute or subacute L5 inferior endplate fracture with mild
depression.
2. Lumbar spine degeneration as described.

## 2023-10-05 ENCOUNTER — Telehealth: Payer: Self-pay

## 2023-10-05 ENCOUNTER — Ambulatory Visit: Admitting: Nurse Practitioner

## 2023-10-05 NOTE — Telephone Encounter (Signed)
 Copied from CRM [id number]. Topic: General - Other >> Oct 05, 2023 12:09 PM [name] wrote: Reason for CRM: Patient called.. doesn't know why his appt was canceled and he missed our call.Nicholas Yates

## 2023-10-05 NOTE — Telephone Encounter (Signed)
 Appointment was canceled due to provider not In office

## 2023-10-20 DIAGNOSIS — M25512 Pain in left shoulder: Secondary | ICD-10-CM | POA: Diagnosis not present

## 2023-10-20 DIAGNOSIS — M19012 Primary osteoarthritis, left shoulder: Secondary | ICD-10-CM | POA: Diagnosis not present

## 2023-11-01 ENCOUNTER — Encounter: Payer: Self-pay | Admitting: Physical Therapy

## 2023-11-01 ENCOUNTER — Other Ambulatory Visit: Payer: Self-pay

## 2023-11-01 ENCOUNTER — Ambulatory Visit: Attending: Orthopedic Surgery | Admitting: Physical Therapy

## 2023-11-01 DIAGNOSIS — M25612 Stiffness of left shoulder, not elsewhere classified: Secondary | ICD-10-CM | POA: Diagnosis not present

## 2023-11-01 DIAGNOSIS — G8929 Other chronic pain: Secondary | ICD-10-CM | POA: Insufficient documentation

## 2023-11-01 DIAGNOSIS — M25512 Pain in left shoulder: Secondary | ICD-10-CM | POA: Insufficient documentation

## 2023-11-01 DIAGNOSIS — M62838 Other muscle spasm: Secondary | ICD-10-CM | POA: Diagnosis not present

## 2023-11-01 NOTE — Therapy (Signed)
 OUTPATIENT PHYSICAL THERAPY SHOULDER EVALUATION   Patient Name: Shriyans Kuenzi MRN: [id number] DOB:May 10, 1955, 68 y.o., male Today's Date: 11/01/2023  END OF SESSION:  PT End of Session - 11/01/23 1355     Visit Number 1    Number of Visits 12    Date for PT Re-Evaluation 12/13/23    PT Start Time 0108    PT Stop Time 0147    PT Time Calculation (min) 39 min    Activity Tolerance Patient tolerated treatment well    Behavior During Therapy [facility] for tasks assessed/performed          Past Medical History:  Diagnosis Date   Depression    GERD (gastroesophageal reflux disease)    had duodenal ulcer 5 months   Post traumatic stress disorder    Past Surgical History:  Procedure Laterality Date   BIOPSY  02/13/2022   Procedure: BIOPSY;  Surgeon: Wilhelmenia Aloha Raddle., MD;  Location: [facility];  Service: Gastroenterology;;   ESOPHAGOGASTRODUODENOSCOPY (EGD) WITH PROPOFOL  N/A 02/13/2022   Procedure: ESOPHAGOGASTRODUODENOSCOPY (EGD) WITH PROPOFOL ;  Surgeon: Wilhelmenia Aloha Raddle., MD;  Location: [facility];  Service: Gastroenterology;  Laterality: N/A;   HERNIA REPAIR     TOTAL HIP ARTHROPLASTY     bilateral   Patient Active Problem List   Diagnosis Date Noted   Age-related osteopor w/curr pathol fx of vertebra w/delayed healing 11/22/2022   MDD (major depressive disorder), recurrent severe, without psychosis (HCC) 07/30/2022   Chronic pain syndrome 05/02/2022   Acquired leg length discrepancy 05/02/2022   Bilateral low back pain without sciatica 05/02/2022   Facet arthritis of lumbar region 05/02/2022   Myofascial pain 05/02/2022   History of anemia 03/31/2022   Dark stools 03/31/2022   Excessive use of nonsteroidal anti-inflammatory drug (NSAID) 03/31/2022   Bilious vomiting with nausea 03/31/2022   Gastric ulcer 02/14/2022   GIB (gastrointestinal bleeding) 02/12/2022   ETOH abuse 02/12/2022   Symptomatic anemia 02/12/2022   Chronic back pain 02/12/2022    Dyslipidemia 04/29/2021   Extrapyramidal symptom 10/04/2018   Bipolar 2 disorder (HCC) 08/31/2018   ED (erectile dysfunction) 08/31/2018   Generalized anxiety disorder 01/20/2017   Depression, recurrent (HCC) 07/07/2016   PTSD (post-traumatic stress disorder) 07/07/2016   Duodenal ulcer 07/07/2016   Diverticulosis 09/04/2014   Duodenal stricture 09/04/2014   GERD (gastroesophageal reflux disease) 12/02/2013   Eczema 11/11/2011   Hip joint replacement status 06/08/2011   DDD (degenerative disc disease), lumbar 03/17/2010    REFERRING PROVIDER: Manus Gobble MD  REFERRING DIAG: Rotator cuff syndrome of left shoulder.  THERAPY DIAG:  Chronic left shoulder pain  Stiffness of left shoulder, not elsewhere classified  Other muscle spasm  Rationale for Evaluation and Treatment: Rehabilitation  ONSET DATE: Ongoing.  Years ago.  SUBJECTIVE:  SUBJECTIVE STATEMENT: The patient presents to the clinic with chronic left shoulder pain.  He states a recent injection was very helpful.  His current pain-level is a 5-6/10 and can go to higher levels with certain movements.  Other than injections he has not found anything helps much to decrease his pain.  He states pretty much everything increases his pain.  He is not able to sleep on his left shoulder due to pain.    PERTINENT HISTORY: See above.    PAIN:  Are you having pain? Ache, throb, sharp.  PRECAUTIONS: None  RED FLAGS: None   WEIGHT BEARING RESTRICTIONS: No  FALLS:  Has patient fallen in last 6 months? No  LIVING ENVIRONMENT: Lives in: House/apartment Has following equipment at home: None  OCCUPATION: Retired.  PLOF: Independent  PATIENT GOALS:Gain left shoulder range of motion and decrease pain.    NEXT MD VISIT:   OBJECTIVE:   Note: Objective measures were completed at Evaluation unless otherwise noted.  DIAGNOSTIC FINDINGS:  11/13/23:  FINDINGS: There is no evidence of fracture or dislocation. Minor acromioclavicular degenerative change. There is no evidence of arthropathy or other focal bone abnormality. Soft tissues are unremarkable.   IMPRESSION: No fracture or subluxation of the left shoulder. Minor acromioclavicular degenerative change.  PATIENT SURVEYS:  Quick DASH:  43:18.  POSTURE: Forward head and rounded shoulder.  UPPER EXTREMITY ROM:   Active antigravity left shoulder flexion is 115 degrees compared to right which is 140 degrees, ER on left is 55 degrees and IR just to hip.    UPPER EXTREMITY MMT:  ER tested with elbow by side is 4 to 4+/5, IR and deltoid and deltoid strength is 4+/5.    SHOULDER SPECIAL TESTS: Positive Impingement testing.  PALPATION:  Tender to palpation over left middle deltoid and to a lesser degree over his left UT.  He reported referred pain into his left UE (commonly to level of elbow).                                                                                                                               TREATMENT DATE: 11/01/23:  IFC at 80-150 Hz on 40% scan to patient's left shoulder x 15 minutes. Normal modality response following removal of modality    PATIENT EDUCATION: Education details: Wall climbs Person educated: Patient Education method: Medical illustrator Education comprehension: verbalized understanding  HOME EXERCISE PROGRAM: As above.  ASSESSMENT:  CLINICAL IMPRESSION: The patient presents to OPPT with c/o chronic left shoulder pain.  A recent injection was helpful.  He has a loss of range of motion per contralateral comparison.  His RTC strength is quite good.  He is tender to palpation over left middle deltoid and to a lesser degree over his left UT.  He reported referred pain into his left UE (commonly to level of  elbow).  His Quick DASH score is 43.18.  Patient will benefit from skilled PT intervention  to address pain and deficits.   OBJECTIVE IMPAIRMENTS: decreased activity tolerance, decreased ROM, increased muscle spasms, and pain.   ACTIVITY LIMITATIONS: carrying, lifting, and reach over head  PARTICIPATION LIMITATIONS: meal prep, cleaning, and laundry  PERSONAL FACTORS: Time since onset of injury/illness/exacerbation are also affecting patient's functional outcome.   REHAB POTENTIAL: Good  CLINICAL DECISION MAKING: Stable/uncomplicated  EVALUATION COMPLEXITY: Low   GOALS:  SHORT TERM GOALS: Target date: 11/15/23.  Ind with an initial HEP.  LONG TERM GOALS: Target date: 12/13/23  Ind with an advanced HEP. Baseline:  Goal status: INITIAL  2.  Active left shoulder flexion to 135-140 degrees so the patient can easily reach overhead.  Goal status: INITIAL  3.  Perform ADL's with pain not > 3/10.  Goal status: INITIAL  4.  Improve behind back motion so that he can touch his left index finger to L4.  Goal status: INITIAL  5.  Improve Quick DASH score by at least 10-15%.  Goal status: INITIAL PLAN:  PT FREQUENCY: 2x/week  PT DURATION: 6 weeks  PLANNED INTERVENTIONS: 97110-Therapeutic exercises, 97530- Therapeutic activity, [id number]- Neuromuscular re-education, 97535- Self Care, 02859- Manual therapy, G0283- Electrical stimulation (unattended), 97035- Ultrasound, 79439 (1-2 muscles), 20561 (3+ muscles)- Dry Needling, Patient/Family education, Cryotherapy, and Moist heat  PLAN FOR NEXT SESSION: Combo e'stim/US , STW/M, wall climbs, pulleys, UBE, wall ladder, UE Ranger, RW4, SDLY ER.   Urias Sheek, [place], PT 11/01/2023, 1:56 PM

## 2023-11-03 ENCOUNTER — Ambulatory Visit: Payer: Self-pay

## 2023-11-03 NOTE — Telephone Encounter (Signed)
 FYI Only or Action Required?: Action required by provider: request for appointment.  Patient was last seen in primary care on 02/07/2023 by Zarwolo, Gloria, FNP.  Called Nurse Triage reporting Wound Infection.  Symptoms began several weeks ago.  Interventions attempted: Nothing.  Symptoms are: gradually worsening.  Triage Disposition: See HCP Within 4 Hours (Or PCP Triage)  Patient/caregiver understands and will follow disposition?: Unsure   Copied from CRM #[id number]. Topic: Clinical - Red Word Triage >> Nov 03, 2023  2:42 PM Turkey B wrote: [facility] that prompted transfer to Nurse Triage: patient has wound from him scraching it on right leg, that doesn't seem to want to heal, also has bad cough, white mucus Reason for Disposition  [1] MILD difficulty breathing (e.g., minimal/no SOB at rest, SOB with walking, pulse < 100) AND [2] still present when not coughing  (Exception: No change from usual, chronic shortness of breath.)  Answer Assessment - Initial Assessment Questions 1. ONSET: When did the cough begin?      Over 40 yes but gotten worse over last 2 weeks  2. SEVERITY: How bad is the cough today?      Mild-mod 3. SPUTUM: Describe the color of your sputum (e.g., none, dry cough; clear, white, yellow, green)     white 4. HEMOPTYSIS: Are you coughing up any blood? If so ask: How much? (e.g., flecks, streaks, tablespoons, etc.)     no 5. DIFFICULTY BREATHING: Are you having difficulty breathing? If Yes, ask: How bad is it? (e.g., mild, moderate, severe)      Sob when he is bending over and can't catch his breath 6. FEVER: Do you have a fever? If Yes, ask: What is your temperature, how was it measured, and when did it start?     no 7. CARDIAC HISTORY: Do you have any history of heart disease? (e.g., heart attack, congestive heart failure)      no 8. LUNG HISTORY: Do you have any history of lung disease?  (e.g., pulmonary embolus, asthma, emphysema)      no 9. PE RISK FACTORS: Do you have a history of blood clots? (or: recent major surgery, recent prolonged travel, bedridden)     no 10. OTHER SYMPTOMS: Do you have any other symptoms? (e.g., runny nose, wheezing, chest pain)       wheezing  Answer Assessment - Initial Assessment Questions 1. LOCATION: Where is the wound located?      Right leg 2. WOUND APPEARANCE: What does the wound look like?      Red around and tried to scab over  3. SIZE: If redness is present, ask: What is the size of the red area? (Inches, centimeters, or compare to size of a coin)      Silver dollar size  4. SPREAD: What's changed in the last day?  Do you see any red streaks coming from the wound?     Red on the skin around it but no read streak 5. ONSET: When did it start to look infected?      For a year 6. MECHANISM: How did the wound start, what was the cause?     Dog nipped him 2 years ago and has just been getting worse 7. PAIN: Do you have any pain?  If Yes, ask: How bad is the pain?  (e.g., Scale 1-10; mild, moderate, or severe)     Only when touched  8. FEVER: Do you have a fever? If Yes, ask: What is your temperature, how  was it measured, and when did it start?     no 9. OTHER SYMPTOMS: Do you have any other symptoms? (e.g., shaking chills, weakness, rash elsewhere on body)     Cough, but no fever, produces white mucus,  Protocols used: Wound Infection Suspected-A-AH, Cough - Chronic-A-AH

## 2023-11-06 NOTE — Telephone Encounter (Signed)
 scheduled

## 2023-11-07 ENCOUNTER — Ambulatory Visit

## 2023-11-07 DIAGNOSIS — G8929 Other chronic pain: Secondary | ICD-10-CM

## 2023-11-07 DIAGNOSIS — M25612 Stiffness of left shoulder, not elsewhere classified: Secondary | ICD-10-CM

## 2023-11-07 DIAGNOSIS — M62838 Other muscle spasm: Secondary | ICD-10-CM

## 2023-11-07 DIAGNOSIS — M25512 Pain in left shoulder: Secondary | ICD-10-CM | POA: Diagnosis not present

## 2023-11-07 NOTE — Therapy (Signed)
 OUTPATIENT PHYSICAL THERAPY SHOULDER TREATMENT   Patient Name: Nicholas Yates MRN: [id number] DOB:12-18-1955, 68 y.o., male Today's Date: 11/07/2023  END OF SESSION:  PT End of Session - 11/07/23 1104     Visit Number 2    Number of Visits 12    Date for PT Re-Evaluation 12/13/23    PT Start Time 1102    PT Stop Time 1141    PT Time Calculation (min) 39 min    Activity Tolerance Patient tolerated treatment well    Behavior During Therapy [facility] for tasks assessed/performed          Past Medical History:  Diagnosis Date   Depression    GERD (gastroesophageal reflux disease)    had duodenal ulcer 5 months   Post traumatic stress disorder    Past Surgical History:  Procedure Laterality Date   BIOPSY  02/13/2022   Procedure: BIOPSY;  Surgeon: Wilhelmenia Aloha Raddle., MD;  Location: [facility];  Service: Gastroenterology;;   ESOPHAGOGASTRODUODENOSCOPY (EGD) WITH PROPOFOL  N/A 02/13/2022   Procedure: ESOPHAGOGASTRODUODENOSCOPY (EGD) WITH PROPOFOL ;  Surgeon: Wilhelmenia Aloha Raddle., MD;  Location: [facility];  Service: Gastroenterology;  Laterality: N/A;   HERNIA REPAIR     TOTAL HIP ARTHROPLASTY     bilateral   Patient Active Problem List   Diagnosis Date Noted   Age-related osteopor w/curr pathol fx of vertebra w/delayed healing 11/22/2022   MDD (major depressive disorder), recurrent severe, without psychosis (HCC) 07/30/2022   Chronic pain syndrome 05/02/2022   Acquired leg length discrepancy 05/02/2022   Bilateral low back pain without sciatica 05/02/2022   Facet arthritis of lumbar region 05/02/2022   Myofascial pain 05/02/2022   History of anemia 03/31/2022   Dark stools 03/31/2022   Excessive use of nonsteroidal anti-inflammatory drug (NSAID) 03/31/2022   Bilious vomiting with nausea 03/31/2022   Gastric ulcer 02/14/2022   GIB (gastrointestinal bleeding) 02/12/2022   ETOH abuse 02/12/2022   Symptomatic anemia 02/12/2022   Chronic back pain 02/12/2022    Dyslipidemia 04/29/2021   Extrapyramidal symptom 10/04/2018   Bipolar 2 disorder (HCC) 08/31/2018   ED (erectile dysfunction) 08/31/2018   Generalized anxiety disorder 01/20/2017   Depression, recurrent (HCC) 07/07/2016   PTSD (post-traumatic stress disorder) 07/07/2016   Duodenal ulcer 07/07/2016   Diverticulosis 09/04/2014   Duodenal stricture 09/04/2014   GERD (gastroesophageal reflux disease) 12/02/2013   Eczema 11/11/2011   Hip joint replacement status 06/08/2011   DDD (degenerative disc disease), lumbar 03/17/2010    REFERRING PROVIDER: Manus Gobble MD  REFERRING DIAG: Rotator cuff syndrome of left shoulder.  THERAPY DIAG:  Chronic left shoulder pain  Stiffness of left shoulder, not elsewhere classified  Other muscle spasm  Rationale for Evaluation and Treatment: Rehabilitation  ONSET DATE: Ongoing.  Years ago.  SUBJECTIVE:  SUBJECTIVE STATEMENT: Pt denies any pain today.   PERTINENT HISTORY: See above.    PAIN:  Are you having pain? No  PRECAUTIONS: None  RED FLAGS: None   WEIGHT BEARING RESTRICTIONS: No  FALLS:  Has patient fallen in last 6 months? No  LIVING ENVIRONMENT: Lives in: House/apartment Has following equipment at home: None  OCCUPATION: Retired.  PLOF: Independent  PATIENT GOALS:Gain left shoulder range of motion and decrease pain.    NEXT MD VISIT:   OBJECTIVE:  Note: Objective measures were completed at Evaluation unless otherwise noted.  DIAGNOSTIC FINDINGS:  11/13/23:  FINDINGS: There is no evidence of fracture or dislocation. Minor acromioclavicular degenerative change. There is no evidence of arthropathy or other focal bone abnormality. Soft tissues are unremarkable.   IMPRESSION: No fracture or subluxation of the left shoulder.  Minor acromioclavicular degenerative change.  PATIENT SURVEYS:  Quick DASH:  43:18.  POSTURE: Forward head and rounded shoulder.  UPPER EXTREMITY ROM:   Active antigravity left shoulder flexion is 115 degrees compared to right which is 140 degrees, ER on left is 55 degrees and IR just to hip.    UPPER EXTREMITY MMT:  ER tested with elbow by side is 4 to 4+/5, IR and deltoid and deltoid strength is 4+/5.    SHOULDER SPECIAL TESTS: Positive Impingement testing.  PALPATION:  Tender to palpation over left middle deltoid and to a lesser degree over his left UT.  He reported referred pain into his left UE (commonly to level of elbow).                                                                                                                               TREATMENT DATE:    11/07/23                                 EXERCISE LOG  Exercise Repetitions and Resistance Comments  UBE 10 mins 120 RPM (forward/backward)   UE Ranger 4 mins   Pulleys 4 mins   Wall Ladder    Ball on Wall 3 mins   Rows  Red x 2 sets of 20 reps   Extension Red x 2 sets of 20 reps   Punchout Red x 2 sets of 20 reps   ER Red x 2 sets of 20 reps    Blank cell = exercise not performed today   11/01/23:  IFC at 80-150 Hz on 40% scan to patient's left shoulder x 15 minutes. Normal modality response following removal of modality    PATIENT EDUCATION: Education details: Wall climbs Person educated: Patient Education method: Medical illustrator Education comprehension: verbalized understanding  HOME EXERCISE PROGRAM: As above.  ASSESSMENT:  CLINICAL IMPRESSION: Pt arrives for today's treatment session denying any pain. Pt able to tolerate UBE today for warm up without issue or complaint of pain.  Pt requiring min verbal and  tactile cuing to avoid twisting with each rep.  Pt requiring several reminders for proper technique and posture as well as avoid compensatory movements.  Pt declined estim  today stating he was pain free.     OBJECTIVE IMPAIRMENTS: decreased activity tolerance, decreased ROM, increased muscle spasms, and pain.   ACTIVITY LIMITATIONS: carrying, lifting, and reach over head  PARTICIPATION LIMITATIONS: meal prep, cleaning, and laundry  PERSONAL FACTORS: Time since onset of injury/illness/exacerbation are also affecting patient's functional outcome.   REHAB POTENTIAL: Good  CLINICAL DECISION MAKING: Stable/uncomplicated  EVALUATION COMPLEXITY: Low   GOALS:  SHORT TERM GOALS: Target date: 11/15/23.  Ind with an initial HEP.  LONG TERM GOALS: Target date: 12/13/23  Ind with an advanced HEP. Baseline:  Goal status: INITIAL  2.  Active left shoulder flexion to 135-140 degrees so the patient can easily reach overhead.  Goal status: INITIAL  3.  Perform ADL's with pain not > 3/10.  Goal status: INITIAL  4.  Improve behind back motion so that he can touch his left index finger to L4.  Goal status: INITIAL  5.  Improve Quick DASH score by at least 10-15%.  Goal status: INITIAL PLAN:  PT FREQUENCY: 2x/week  PT DURATION: 6 weeks  PLANNED INTERVENTIONS: 97110-Therapeutic exercises, 97530- Therapeutic activity, [id number]- Neuromuscular re-education, 97535- Self Care, 02859- Manual therapy, [id number]- Electrical stimulation (unattended), 97035- Ultrasound, 79439 (1-2 muscles), 20561 (3+ muscles)- Dry Needling, Patient/Family education, Cryotherapy, and Moist heat  PLAN FOR NEXT SESSION: Combo e'stim/US , STW/M, wall climbs, pulleys, UBE, wall ladder, UE Ranger, RW4, SDLY ER.   Delon DELENA Gosling, PTA 11/07/2023, 11:43 AM

## 2023-11-13 ENCOUNTER — Ambulatory Visit (INDEPENDENT_AMBULATORY_CARE_PROVIDER_SITE_OTHER): Admitting: Family Medicine

## 2023-11-13 ENCOUNTER — Encounter: Payer: Self-pay | Admitting: Family Medicine

## 2023-11-13 ENCOUNTER — Ambulatory Visit: Admitting: Nurse Practitioner

## 2023-11-13 VITALS — BP 128/68 | HR 82 | Resp 16 | Ht 72.0 in | Wt 197.1 lb

## 2023-11-13 DIAGNOSIS — L97919 Non-pressure chronic ulcer of unspecified part of right lower leg with unspecified severity: Secondary | ICD-10-CM | POA: Insufficient documentation

## 2023-11-13 DIAGNOSIS — I83019 Varicose veins of right lower extremity with ulcer of unspecified site: Secondary | ICD-10-CM | POA: Diagnosis not present

## 2023-11-13 MED ORDER — CEPHALEXIN 500 MG PO CAPS
500.0000 mg | ORAL_CAPSULE | Freq: Four times a day (QID) | ORAL | 0 refills | Status: AC
Start: 2023-11-13 — End: 2023-11-20

## 2023-11-13 MED ORDER — MUPIROCIN 2 % EX OINT
1.0000 | TOPICAL_OINTMENT | Freq: Two times a day (BID) | CUTANEOUS | 0 refills | Status: AC
Start: 2023-11-13 — End: ?

## 2023-11-13 NOTE — Progress Notes (Signed)
 Acute Office Visit  Subjective:    Patient ID: Nicholas Yates, male    DOB: 05-14-1955, 68 y.o.   MRN: [id number]  Chief Complaint  Patient presents with   Wound Check    Pt here for eval of wound on right shin. States it has been there for a year. Pt also states  that he has been having foot swelling and tingling in toes since developing the wound     HPI The patient presents today for evaluation of a wound on the right shin, present for approximately one year. He reports associated foot swelling and tingling in the toes since the wound developed. Prior to the ulcer, he experienced skin deterioration of the right lower extremity, for which he applied CeraVe and Vaseline. He also reports a history of varicose veins, which had previously healed. Months later, after scratching the area, the skin breakdown progressed into an ulcer on the right lower leg. He notes swelling of the feet with clear fluid drainage.  The patient is currently taking amoxicillin for a dental infection.  Past Medical History:  Diagnosis Date   Anemia    See notes from [facility] for duodenal ulcer   Arthritis    Back   Depression    GERD (gastroesophageal reflux disease)    had duodenal ulcer 5 months   Post traumatic stress disorder    Substance abuse (HCC) 40 years ago   Several stints at rehab. Request referral for one on one counseling with psychologist   Ulcer    See [facility] records    Past Surgical History:  Procedure Laterality Date   BIOPSY  02/13/2022   Procedure: BIOPSY;  Surgeon: Wilhelmenia Aloha Raddle., MD;  Location: [facility];  Service: Gastroenterology;;   ESOPHAGOGASTRODUODENOSCOPY (EGD) WITH PROPOFOL  N/A 02/13/2022   Procedure: ESOPHAGOGASTRODUODENOSCOPY (EGD) WITH PROPOFOL ;  Surgeon: Wilhelmenia Aloha Raddle., MD;  Location: [facility];  Service: Gastroenterology;  Laterality: N/A;   HERNIA REPAIR     JOINT REPLACEMENT     See [facility] records   SPINE SURGERY      Injection of bone cement   TOTAL HIP ARTHROPLASTY     bilateral    Family History  Problem Relation Age of Onset   Anxiety disorder Mother    Depression Mother    Heart disease Father    Varicose Veins Father    Drug abuse Brother    Colon cancer Neg Hx    Esophageal cancer Neg Hx    Inflammatory bowel disease Neg Hx    Liver disease Neg Hx    Pancreatic cancer Neg Hx    Rectal cancer Neg Hx    Stomach cancer Neg Hx     Social History   Socioeconomic History   Marital status: Single    Spouse name: Not on file   Number of children: Not on file   Years of education: Not on file   Highest education level: 9th grade  Occupational History   Not on file  Tobacco Use   Smoking status: Never   Smokeless tobacco: Never  Vaping Use   Vaping status: Never Used  Substance and Sexual Activity   Alcohol use: Not Currently    Alcohol/week: 42.0 standard drinks of alcohol    Types: 42 Cans of beer per week    Comment: daily   Drug use: Not Currently    Types: Marijuana   Sexual activity: Yes  Other Topics Concern   Not on file  Social History  Narrative   Not on file   Social Drivers of Health   Financial Resource Strain: Low Risk  (10/04/2023)   Overall Financial Resource Strain (CARDIA)    Difficulty of Paying Living Expenses: Not very hard  Food Insecurity: No Food Insecurity (10/04/2023)   Hunger Vital Sign    Worried About Running Out of Food in the Last Year: Never true    Ran Out of Food in the Last Year: Never true  Transportation Needs: No Transportation Needs (10/04/2023)   PRAPARE - Administrator, Civil Service (Medical): No    Lack of Transportation (Non-Medical): No  Physical Activity: Sufficiently Active (10/04/2023)   Exercise Vital Sign    Days of Exercise per Week: 4 days    Minutes of Exercise per Session: 90 min  Stress: Stress Concern Present (10/04/2023)   [employer] of Occupational Health - Occupational Stress Questionnaire     Feeling of Stress: Very much  Social Connections: Unknown (10/04/2023)   Social Connection and Isolation Panel    Frequency of Communication with Friends and Family: Patient declined    Frequency of Social Gatherings with Friends and Family: Patient declined    Attends Religious Services: Patient declined    Database administrator or Organizations: Patient declined    Attends Banker Meetings: Not on file    Marital Status: Living with partner  Intimate Partner Violence: Not At Risk (07/31/2022)   Received from [facility]   HITS    Over the last 12 months how often did your partner physically hurt you?: Never    Over the last 12 months how often did your partner insult you or talk down to you?: Never    Over the last 12 months how often did your partner threaten you with physical harm?: Never    Over the last 12 months how often did your partner scream or curse at you?: Never    Outpatient Medications Prior to Visit  Medication Sig Dispense Refill   amoxicillin (AMOXIL) 875 MG tablet Take 875 mg by mouth 2 (two) times daily.     omeprazole  (PRILOSEC) 20 MG capsule Take 1 capsule (20 mg total) by mouth daily. (Patient not taking: Reported on 11/13/2023) 30 capsule 0   escitalopram (LEXAPRO) 5 MG tablet Take 5 mg by mouth every morning. (Patient not taking: Reported on 02/07/2023)     hydrOXYzine  (ATARAX ) 10 MG tablet Take 10 mg by mouth 3 (three) times daily as needed. (Patient not taking: Reported on 02/07/2023)     mirtazapine (REMERON) 7.5 MG tablet Take 7.5 mg by mouth at bedtime. (Patient not taking: Reported on 02/07/2023)     No facility-administered medications prior to visit.    No Known Allergies  Review of Systems  Constitutional:  Negative for fatigue and fever.  Eyes:  Negative for visual disturbance.  Respiratory:  Negative for chest tightness and shortness of breath.   Cardiovascular:  Negative for chest pain and palpitations.  Skin:  Positive for wound.   Neurological:  Negative for dizziness and headaches.       Objective:    Physical Exam HENT:     Head: Normocephalic.     Right Ear: External ear normal.     Left Ear: External ear normal.     Nose: No congestion or rhinorrhea.     Mouth/Throat:     Mouth: Mucous membranes are moist.  Cardiovascular:     Rate and Rhythm: Regular rhythm.  Heart sounds: No murmur heard. Pulmonary:     Effort: No respiratory distress.     Breath sounds: Normal breath sounds.  Skin:    Findings: Lesion present.  Neurological:     Mental Status: He is alert.     BP 128/68   Pulse 82   Resp 16   Ht 6' (1.829 m)   Wt 197 lb 1.9 oz (89.4 kg)   SpO2 97%   BMI 26.73 kg/m  Wt Readings from Last 3 Encounters:  11/13/23 197 lb 1.9 oz (89.4 kg)  02/07/23 207 lb 1.3 oz (93.9 kg)  12/26/22 206 lb (93.4 kg)       Assessment & Plan:  Venous ulcer of right leg (HCC) Assessment & Plan: -Start Keflex  500 mg orally four times daily. -Apply Bactroban  (mupirocin ) antibiotic ointment directly to the wound to help prevent infection. -Keep the wound clean and covered with a sterile, non-stick dressing. -Elevate legs when sitting or lying down to reduce swelling. -Monitor for signs of infection such as increased redness, warmth, swelling, pus, worsening pain, or fever.  Wound Management -Cleanse wound gently with normal saline or mild wound cleanser (avoid harsh antiseptics). -Apply moist wound dressings (e.g., hydrocolloids, foam, or alginate) to support healing. -Change dressings regularly based on drainage and provider recommendations. -Monitor closely for signs of infection (increasing redness, warmth, swelling, purulent drainage, odor, fever).  Edema Management -Leg elevation several times daily above the level of the heart for 30 minutes at a time. -Avoid prolonged sitting or standing without movement. -Encourage regular walking and calf-muscle exercises to promote circulation.  Pain &  Skin Care -Use analgesics (acetaminophen  or NSAIDs if not contraindicated) for pain. -Apply moisturizers to surrounding skin to prevent dryness and breakdown.  Orders: -     AMB referral to wound care center -     Ambulatory referral to Vascular Surgery -     Cephalexin ; Take 1 capsule (500 mg total) by mouth 4 (four) times daily for 7 days.  Dispense: 28 capsule; Refill: 0 -     Mupirocin ; Apply 1 Application topically 2 (two) times daily.  Dispense: 22 g; Refill: 0 -     CBC with Differential/Platelet -     CMP14+EGFR -     Hemoglobin A1c  Note: This chart has been completed using Engineer, civil (consulting) software, and while attempts have been made to ensure accuracy, certain words and phrases may not be transcribed as intended.    Laresa Oshiro, FNP

## 2023-11-13 NOTE — Patient Instructions (Addendum)
 I appreciate the opportunity to provide care to you today!  Labs: please stop by the lab today to get your blood drawn (CBC, CMP,  HgA1c)  Venous Ulcer (Prophylactic Treatment) -Start Keflex  500 mg orally four times daily. -Apply Bactroban  (mupirocin ) antibiotic ointment directly to the wound to help prevent infection. -Keep the wound clean and covered with a sterile, non-stick dressing. -Elevate legs when sitting or lying down to reduce swelling. -Monitor for signs of infection such as increased redness, warmth, swelling, pus, worsening pain, or fever.  Wound Management -Cleanse wound gently with normal saline or mild wound cleanser (avoid harsh antiseptics). -Apply moist wound dressings (e.g., hydrocolloids, foam, or alginate) to support healing. -Change dressings regularly based on drainage and provider recommendations. -Monitor closely for signs of infection (increasing redness, warmth, swelling, purulent drainage, odor, fever).  Edema Management -Leg elevation several times daily above the level of the heart for 30 minutes at a time. -Avoid prolonged sitting or standing without movement. -Encourage regular walking and calf-muscle exercises to promote circulation.  Pain & Skin Care -Use analgesics (acetaminophen  or NSAIDs if not contraindicated) for pain. -Apply moisturizers to surrounding skin to prevent dryness and breakdown.  Please follow up if your symptoms worsen or fail to improve.   Referrals today- Vascular surgery and Wound care   Please continue to a heart-healthy diet and increase your physical activities. Try to exercise for at least five days a week.    It was a pleasure to see you and I look forward to continuing to work together on your health and well-being. Please do not hesitate to call the office if you need care or have questions about your care.  In case of emergency, please visit the Emergency Department for urgent care, or contact our clinic at  [phone] to schedule an appointment. We're here to help you!   Have a wonderful day and week. With Gratitude, Damarious Holtsclaw MSN, FNP-BC

## 2023-11-13 NOTE — Assessment & Plan Note (Signed)
-  Start Keflex  500 mg orally four times daily. -Apply Bactroban  (mupirocin ) antibiotic ointment directly to the wound to help prevent infection. -Keep the wound clean and covered with a sterile, non-stick dressing. -Elevate legs when sitting or lying down to reduce swelling. -Monitor for signs of infection such as increased redness, warmth, swelling, pus, worsening pain, or fever.  Wound Management -Cleanse wound gently with normal saline or mild wound cleanser (avoid harsh antiseptics). -Apply moist wound dressings (e.g., hydrocolloids, foam, or alginate) to support healing. -Change dressings regularly based on drainage and provider recommendations. -Monitor closely for signs of infection (increasing redness, warmth, swelling, purulent drainage, odor, fever).  Edema Management -Leg elevation several times daily above the level of the heart for 30 minutes at a time. -Avoid prolonged sitting or standing without movement. -Encourage regular walking and calf-muscle exercises to promote circulation.  Pain & Skin Care -Use analgesics (acetaminophen  or NSAIDs if not contraindicated) for pain. -Apply moisturizers to surrounding skin to prevent dryness and breakdown.

## 2023-11-14 ENCOUNTER — Ambulatory Visit

## 2023-11-14 DIAGNOSIS — M62838 Other muscle spasm: Secondary | ICD-10-CM | POA: Diagnosis not present

## 2023-11-14 DIAGNOSIS — G8929 Other chronic pain: Secondary | ICD-10-CM | POA: Diagnosis not present

## 2023-11-14 DIAGNOSIS — M25612 Stiffness of left shoulder, not elsewhere classified: Secondary | ICD-10-CM

## 2023-11-14 DIAGNOSIS — M25512 Pain in left shoulder: Secondary | ICD-10-CM | POA: Diagnosis not present

## 2023-11-14 LAB — CBC WITH DIFFERENTIAL/PLATELET

## 2023-11-14 NOTE — Therapy (Signed)
 OUTPATIENT PHYSICAL THERAPY SHOULDER TREATMENT   Patient Name: Nicholas Yates MRN: [id number] DOB:08-21-1955, 68 y.o., male Today's Date: 11/14/2023  END OF SESSION:  PT End of Session - 11/14/23 1304     Visit Number 3    Number of Visits 12    Date for PT Re-Evaluation 12/13/23    PT Start Time 1301    PT Stop Time 1333   Patietn requested to leave early   PT Time Calculation (min) 32 min    Activity Tolerance Patient tolerated treatment well    Behavior During Therapy WFL for tasks assessed/performed           Past Medical History:  Diagnosis Date   Anemia    See notes from [facility] for duodenal ulcer   Arthritis    Back   Depression    GERD (gastroesophageal reflux disease)    had duodenal ulcer 5 months   Post traumatic stress disorder    Substance abuse (HCC) 40 years ago   Several stints at rehab. Request referral for one on one counseling with psychologist   Ulcer    See [facility] records   Past Surgical History:  Procedure Laterality Date   BIOPSY  02/13/2022   Procedure: BIOPSY;  Surgeon: Wilhelmenia Aloha Raddle., MD;  Location: [facility];  Service: Gastroenterology;;   ESOPHAGOGASTRODUODENOSCOPY (EGD) WITH PROPOFOL  N/A 02/13/2022   Procedure: ESOPHAGOGASTRODUODENOSCOPY (EGD) WITH PROPOFOL ;  Surgeon: Wilhelmenia Aloha Raddle., MD;  Location: [facility];  Service: Gastroenterology;  Laterality: N/A;   HERNIA REPAIR     JOINT REPLACEMENT     See [facility] records   SPINE SURGERY     Injection of bone cement   TOTAL HIP ARTHROPLASTY     bilateral   Patient Active Problem List   Diagnosis Date Noted   Venous ulcer of right leg (HCC) 11/13/2023   Age-related osteopor w/curr pathol fx of vertebra w/delayed healing 11/22/2022   MDD (major depressive disorder), recurrent severe, without psychosis (HCC) 07/30/2022   Chronic pain syndrome 05/02/2022   Acquired leg length discrepancy 05/02/2022   Bilateral low back pain without sciatica  05/02/2022   Facet arthritis of lumbar region 05/02/2022   Myofascial pain 05/02/2022   History of anemia 03/31/2022   Dark stools 03/31/2022   Excessive use of nonsteroidal anti-inflammatory drug (NSAID) 03/31/2022   Bilious vomiting with nausea 03/31/2022   Gastric ulcer 02/14/2022   GIB (gastrointestinal bleeding) 02/12/2022   ETOH abuse 02/12/2022   Symptomatic anemia 02/12/2022   Chronic back pain 02/12/2022   Dyslipidemia 04/29/2021   Extrapyramidal symptom 10/04/2018   Bipolar 2 disorder (HCC) 08/31/2018   ED (erectile dysfunction) 08/31/2018   Generalized anxiety disorder 01/20/2017   Depression, recurrent (HCC) 07/07/2016   PTSD (post-traumatic stress disorder) 07/07/2016   Duodenal ulcer 07/07/2016   Diverticulosis 09/04/2014   Duodenal stricture 09/04/2014   GERD (gastroesophageal reflux disease) 12/02/2013   Eczema 11/11/2011   Hip joint replacement status 06/08/2011   DDD (degenerative disc disease), lumbar 03/17/2010    REFERRING PROVIDER: Manus Gobble MD  REFERRING DIAG: Rotator cuff syndrome of left shoulder.  THERAPY DIAG:  Chronic left shoulder pain  Stiffness of left shoulder, not elsewhere classified  Other muscle spasm  Rationale for Evaluation and Treatment: Rehabilitation  ONSET DATE: Ongoing.  Years ago.  SUBJECTIVE:  SUBJECTIVE STATEMENT: Patient reports that his shoulder feels good today.   PERTINENT HISTORY: See above.    PAIN:  Are you having pain? No  PRECAUTIONS: None  RED FLAGS: None   WEIGHT BEARING RESTRICTIONS: No  FALLS:  Has patient fallen in last 6 months? No  LIVING ENVIRONMENT: Lives in: House/apartment Has following equipment at home: None  OCCUPATION: Retired.  PLOF: Independent  PATIENT GOALS:Gain left shoulder range of  motion and decrease pain.    NEXT MD VISIT:   OBJECTIVE:  Note: Objective measures were completed at Evaluation unless otherwise noted.  DIAGNOSTIC FINDINGS:  11/13/23:  FINDINGS: There is no evidence of fracture or dislocation. Minor acromioclavicular degenerative change. There is no evidence of arthropathy or other focal bone abnormality. Soft tissues are unremarkable.   IMPRESSION: No fracture or subluxation of the left shoulder. Minor acromioclavicular degenerative change.  PATIENT SURVEYS:  Quick DASH:  43:18.  POSTURE: Forward head and rounded shoulder.  UPPER EXTREMITY ROM:   Active antigravity left shoulder flexion is 115 degrees compared to right which is 140 degrees, ER on left is 55 degrees and IR just to hip.    UPPER EXTREMITY MMT:  ER tested with elbow by side is 4 to 4+/5, IR and deltoid and deltoid strength is 4+/5.    SHOULDER SPECIAL TESTS: Positive Impingement testing.  PALPATION:  Tender to palpation over left middle deltoid and to a lesser degree over his left UT.  He reported referred pain into his left UE (commonly to level of elbow).                                                                                                                               TREATMENT DATE:                                    11/14/23 EXERCISE LOG  Exercise Repetitions and Resistance Comments  UBE 10 minutes @ 90 RPM    UE ranger (standing)   3 minutes  With a focus on shoulder flexion  Resisted row  Blue t-band x 2 minutes   Resisted pull down  Blue t-band x 2 minutes   L shoulder IR  Blue t-band x 2 minutes    Doorway stretch  15 reps w/ 10 second hold    Blank cell = exercise not performed today   11/07/23                                 EXERCISE LOG  Exercise Repetitions and Resistance Comments  UBE 10 mins 120 RPM (forward/backward)   UE Ranger 4 mins   Pulleys 4 mins   Wall Ladder    Ball on Wall 3 mins   Rows  Red x 2 sets of 20 reps   Extension  Red x 2 sets of 20 reps   Punchout Red x 2 sets of 20 reps   ER Red x 2 sets of 20 reps    Blank cell = exercise not performed today   11/01/23:  IFC at 80-150 Hz on 40% scan to patient's left shoulder x 15 minutes. Normal modality response following removal of modality    PATIENT EDUCATION: Education details: HEP Person educated: Patient Education method: Programmer, multimedia, Demonstration, and Handouts Education comprehension: verbalized understanding and returned demonstration  HOME EXERCISE PROGRAM: [id number]  ASSESSMENT:  CLINICAL IMPRESSION: Patient was progressed with new and familiar interventions for improved shoulder strength with moderate difficulty and fatigue. He required minimal cueing with resisted internal rotation for proper exercise performance to avoid compensatory movement patterns. He experienced no increase in pain or discomfort with any of today's interventions. He reported that his shoulder felt tired upon the conclusion of treatment. He continues to require skilled physical therapy to address his remaining impairments to return to his prior level of function.   OBJECTIVE IMPAIRMENTS: decreased activity tolerance, decreased ROM, increased muscle spasms, and pain.   ACTIVITY LIMITATIONS: carrying, lifting, and reach over head  PARTICIPATION LIMITATIONS: meal prep, cleaning, and laundry  PERSONAL FACTORS: Time since onset of injury/illness/exacerbation are also affecting patient's functional outcome.   REHAB POTENTIAL: Good  CLINICAL DECISION MAKING: Stable/uncomplicated  EVALUATION COMPLEXITY: Low   GOALS:  SHORT TERM GOALS: Target date: 11/15/23.  Ind with an initial HEP.  LONG TERM GOALS: Target date: 12/13/23  Ind with an advanced HEP. Baseline:  Goal status: INITIAL  2.  Active left shoulder flexion to 135-140 degrees so the patient can easily reach overhead.  Goal status: INITIAL  3.  Perform ADL's with pain not > 3/10.  Goal status: INITIAL  4.   Improve behind back motion so that he can touch his left index finger to L4.  Goal status: INITIAL  5.  Improve Quick DASH score by at least 10-15%.  Goal status: INITIAL PLAN:  PT FREQUENCY: 2x/week  PT DURATION: 6 weeks  PLANNED INTERVENTIONS: 97110-Therapeutic exercises, 97530- Therapeutic activity, [id number]- Neuromuscular re-education, 97535- Self Care, [id number]- Manual therapy, [id number]- Electrical stimulation (unattended), 97035- Ultrasound, 79439 (1-2 muscles), 20561 (3+ muscles)- Dry Needling, Patient/Family education, Cryotherapy, and Moist heat  PLAN FOR NEXT SESSION: Combo e'stim/US , STW/M, wall climbs, pulleys, UBE, wall ladder, UE Ranger, RW4, SDLY ER.   Lacinda JAYSON Fass, PT 11/14/2023, 1:35 PM

## 2023-11-15 ENCOUNTER — Encounter: Payer: Self-pay | Admitting: Family Medicine

## 2023-11-15 LAB — CBC WITH DIFFERENTIAL/PLATELET
Basos: 1
EOS (ABSOLUTE): 0.1 x10E3/uL (ref 0.0–0.2)
Eos: 1
Hematocrit: 44 (ref 37.5–51.0)
Hemoglobin: 14.2 g/dL (ref 13.0–17.7)
Immature Granulocytes: 0
Immature Granulocytes: 0 x10E3/uL (ref 0.0–0.1)
Lymphs: 24
MCH: 34 pg — AB (ref 26.6–33.0)
MCHC: 32.3 g/dL (ref 31.5–35.7)
MCV: 105 fL — AB (ref 79–97)
Monocytes Absolute: 0.1 x10E3/uL (ref 0.0–0.4)
Monocytes Absolute: 0.6 x10E3/uL (ref 0.1–0.9)
Monocytes: 8
Neutrophils Absolute: 1.7 x10E3/uL (ref 0.7–3.1)
Neutrophils Absolute: 4.8 x10E3/uL (ref 1.4–7.0)
Neutrophils: 66
Platelets: 418 x10E3/uL (ref 150–450)
RBC: 4.18 x10E6/uL (ref 4.14–5.80)
RDW: 12.8 (ref 11.6–15.4)
WBC: 7.3 x10E3/uL (ref 3.4–10.8)

## 2023-11-15 LAB — CMP14+EGFR
ALT: 19 IU/L (ref 0–44)
AST: 23 IU/L (ref 0–40)
Albumin: 4.4 g/dL (ref 3.9–4.9)
Alkaline Phosphatase: 121 IU/L (ref 44–121)
BUN/Creatinine Ratio: 12 (ref 10–24)
BUN: 14 mg/dL (ref 8–27)
Bilirubin Total: 0.5 mg/dL (ref 0.0–1.2)
CO2: 22 mmol/L (ref 20–29)
Calcium: 9.3 mg/dL (ref 8.6–10.2)
Chloride: 103 mmol/L (ref 96–106)
Creatinine, Ser: 1.18 mg/dL (ref 0.76–1.27)
Globulin, Total: 2.5 g/dL (ref 1.5–4.5)
Glucose: 84 mg/dL (ref 70–99)
Potassium: 4.7 mmol/L (ref 3.5–5.2)
Sodium: 140 mmol/L (ref 134–144)
Total Protein: 6.9 g/dL (ref 6.0–8.5)
eGFR: 67 mL/min/1.73 (ref >59)

## 2023-11-15 LAB — HEMOGLOBIN A1C
Est. average glucose Bld gHb Est-mCnc: 88 mg/dL
Hgb A1c MFr Bld: 4.7 % — ABNORMAL LOW (ref 4.8–5.6)

## 2023-11-18 ENCOUNTER — Ambulatory Visit: Payer: Self-pay | Admitting: Family Medicine

## 2023-11-21 ENCOUNTER — Ambulatory Visit: Attending: Orthopedic Surgery | Admitting: Physical Therapy

## 2023-11-21 DIAGNOSIS — G8929 Other chronic pain: Secondary | ICD-10-CM | POA: Diagnosis not present

## 2023-11-21 DIAGNOSIS — M25612 Stiffness of left shoulder, not elsewhere classified: Secondary | ICD-10-CM | POA: Insufficient documentation

## 2023-11-21 DIAGNOSIS — M25512 Pain in left shoulder: Secondary | ICD-10-CM | POA: Diagnosis not present

## 2023-11-21 DIAGNOSIS — M62838 Other muscle spasm: Secondary | ICD-10-CM | POA: Insufficient documentation

## 2023-11-21 NOTE — Therapy (Signed)
 OUTPATIENT PHYSICAL THERAPY SHOULDER TREATMENT   Patient Name: Nicholas Yates MRN: [id number] DOB:12-Nov-1955, 68 y.o., male Today's Date: 11/21/2023  END OF SESSION:  PT End of Session - 11/21/23 1102     Visit Number 4    Number of Visits 12    Date for PT Re-Evaluation 12/13/23    PT Start Time 1100    PT Stop Time 1151    PT Time Calculation (min) 51 min    Activity Tolerance Patient tolerated treatment well    Behavior During Therapy WFL for tasks assessed/performed           Past Medical History:  Diagnosis Date   Anemia    See notes from [facility] for duodenal ulcer   Arthritis    Back   Depression    GERD (gastroesophageal reflux disease)    had duodenal ulcer 5 months   Post traumatic stress disorder    Substance abuse (HCC) 40 years ago   Several stints at rehab. Request referral for one on one counseling with psychologist   Ulcer    See [facility] records   Past Surgical History:  Procedure Laterality Date   BIOPSY  02/13/2022   Procedure: BIOPSY;  Surgeon: Wilhelmenia Aloha Raddle., MD;  Location: [facility];  Service: Gastroenterology;;   ESOPHAGOGASTRODUODENOSCOPY (EGD) WITH PROPOFOL  N/A 02/13/2022   Procedure: ESOPHAGOGASTRODUODENOSCOPY (EGD) WITH PROPOFOL ;  Surgeon: Wilhelmenia Aloha Raddle., MD;  Location: [facility];  Service: Gastroenterology;  Laterality: N/A;   HERNIA REPAIR     JOINT REPLACEMENT     See [facility] records   SPINE SURGERY     Injection of bone cement   TOTAL HIP ARTHROPLASTY     bilateral   Patient Active Problem List   Diagnosis Date Noted   Venous ulcer of right leg (HCC) 11/13/2023   Age-related osteopor w/curr pathol fx of vertebra w/delayed healing 11/22/2022   MDD (major depressive disorder), recurrent severe, without psychosis (HCC) 07/30/2022   Chronic pain syndrome 05/02/2022   Acquired leg length discrepancy 05/02/2022   Bilateral low back pain without sciatica 05/02/2022   Facet arthritis of lumbar  region 05/02/2022   Myofascial pain 05/02/2022   History of anemia 03/31/2022   Dark stools 03/31/2022   Excessive use of nonsteroidal anti-inflammatory drug (NSAID) 03/31/2022   Bilious vomiting with nausea 03/31/2022   Gastric ulcer 02/14/2022   GIB (gastrointestinal bleeding) 02/12/2022   ETOH abuse 02/12/2022   Symptomatic anemia 02/12/2022   Chronic back pain 02/12/2022   Dyslipidemia 04/29/2021   Extrapyramidal symptom 10/04/2018   Bipolar 2 disorder (HCC) 08/31/2018   ED (erectile dysfunction) 08/31/2018   Generalized anxiety disorder 01/20/2017   Depression, recurrent (HCC) 07/07/2016   PTSD (post-traumatic stress disorder) 07/07/2016   Duodenal ulcer 07/07/2016   Diverticulosis 09/04/2014   Duodenal stricture 09/04/2014   GERD (gastroesophageal reflux disease) 12/02/2013   Eczema 11/11/2011   Hip joint replacement status 06/08/2011   DDD (degenerative disc disease), lumbar 03/17/2010    REFERRING PROVIDER: Manus Gobble MD  REFERRING DIAG: Rotator cuff syndrome of left shoulder.  THERAPY DIAG:  Chronic left shoulder pain  Stiffness of left shoulder, not elsewhere classified  Other muscle spasm  Rationale for Evaluation and Treatment: Rehabilitation  ONSET DATE: Ongoing.  Years ago.  SUBJECTIVE:  SUBJECTIVE STATEMENT: Hurting today.  Must have slept on it wrong.  PERTINENT HISTORY: See above.    PAIN:  Are you having pain? No  PRECAUTIONS: None  RED FLAGS: None   WEIGHT BEARING RESTRICTIONS: No  FALLS:  Has patient fallen in last 6 months? No  LIVING ENVIRONMENT: Lives in: House/apartment Has following equipment at home: None  OCCUPATION: Retired.  PLOF: Independent  PATIENT GOALS:Gain left shoulder range of motion and decrease pain.    NEXT MD VISIT:    OBJECTIVE:  Note: Objective measures were completed at Evaluation unless otherwise noted.  DIAGNOSTIC FINDINGS:  11/13/23:  FINDINGS: There is no evidence of fracture or dislocation. Minor acromioclavicular degenerative change. There is no evidence of arthropathy or other focal bone abnormality. Soft tissues are unremarkable.   IMPRESSION: No fracture or subluxation of the left shoulder. Minor acromioclavicular degenerative change.  PATIENT SURVEYS:  Quick DASH:  43:18.  POSTURE: Forward head and rounded shoulder.  UPPER EXTREMITY ROM:   Active antigravity left shoulder flexion is 115 degrees compared to right which is 140 degrees, ER on left is 55 degrees and IR just to hip.    UPPER EXTREMITY MMT:  ER tested with elbow by side is 4 to 4+/5, IR and deltoid and deltoid strength is 4+/5.    SHOULDER SPECIAL TESTS: Positive Impingement testing.  PALPATION:  Tender to palpation over left middle deltoid and to a lesser degree over his left UT.  He reported referred pain into his left UE (commonly to level of elbow).                                                                                                                               TREATMENT DATE:   11/21/23:  UBE x 8 minutes f/b combo e'stim/US  at 1.50 W/CM2 x 8 minutes f/b STW/M x 8 minutes with ischemic release technique utilized to left middle deltoid f/b IFC at 8-150 Hz on 40% scan x 20 minutes.                                   11/14/23 EXERCISE LOG  Exercise Repetitions and Resistance Comments  UBE 10 minutes @ 90 RPM    UE ranger (standing)   3 minutes  With a focus on shoulder flexion  Resisted row  Blue t-band x 2 minutes   Resisted pull down  Blue t-band x 2 minutes   L shoulder IR  Blue t-band x 2 minutes    Doorway stretch  15 reps w/ 10 second hold    Blank cell = exercise not performed today   11/07/23                                 EXERCISE LOG  Exercise Repetitions and Resistance Comments  UBE  10  mins 120 RPM (forward/backward)   UE Ranger 4 mins   Pulleys 4 mins   Wall Ladder    Ball on Wall 3 mins   Rows  Red x 2 sets of 20 reps   Extension Red x 2 sets of 20 reps   Punchout Red x 2 sets of 20 reps   ER Red x 2 sets of 20 reps    Blank cell = exercise not performed today   11/01/23:  IFC at 80-150 Hz on 40% scan to patient's left shoulder x 15 minutes. Normal modality response following removal of modality    PATIENT EDUCATION: Education details: HEP Person educated: Patient Education method: Explanation, Demonstration, and Handouts Education comprehension: verbalized understanding and returned demonstration  HOME EXERCISE PROGRAM: [id number]  ASSESSMENT:  CLINICAL IMPRESSION: Patient with increased pain today.  He was active over the weekend.  He had a notable trigger point over his left middle deltoid.  He did well with soft tissue work.    OBJECTIVE IMPAIRMENTS: decreased activity tolerance, decreased ROM, increased muscle spasms, and pain.   ACTIVITY LIMITATIONS: carrying, lifting, and reach over head  PARTICIPATION LIMITATIONS: meal prep, cleaning, and laundry  PERSONAL FACTORS: Time since onset of injury/illness/exacerbation are also affecting patient's functional outcome.   REHAB POTENTIAL: Good  CLINICAL DECISION MAKING: Stable/uncomplicated  EVALUATION COMPLEXITY: Low   GOALS:  SHORT TERM GOALS: Target date: 11/15/23.  Ind with an initial HEP.  LONG TERM GOALS: Target date: 12/13/23  Ind with an advanced HEP. Baseline:  Goal status: INITIAL  2.  Active left shoulder flexion to 135-140 degrees so the patient can easily reach overhead.  Goal status: INITIAL  3.  Perform ADL's with pain not > 3/10.  Goal status: INITIAL  4.  Improve behind back motion so that he can touch his left index finger to L4.  Goal status: INITIAL  5.  Improve Quick DASH score by at least 10-15%.  Goal status: INITIAL PLAN:  PT FREQUENCY: 2x/week  PT  DURATION: 6 weeks  PLANNED INTERVENTIONS: 97110-Therapeutic exercises, 97530- Therapeutic activity, [id number]- Neuromuscular re-education, 97535- Self Care, 02859- Manual therapy, G0283- Electrical stimulation (unattended), 97035- Ultrasound, 79439 (1-2 muscles), 20561 (3+ muscles)- Dry Needling, Patient/Family education, Cryotherapy, and Moist heat  PLAN FOR NEXT SESSION: Combo e'stim/US , STW/M, wall climbs, pulleys, UBE, wall ladder, UE Ranger, RW4, SDLY ER.   Marisella Puccio, [place], PT 11/21/2023, 11:59 AM

## 2023-11-24 ENCOUNTER — Encounter

## 2023-11-24 ENCOUNTER — Other Ambulatory Visit: Payer: Self-pay | Admitting: *Deleted

## 2023-11-24 DIAGNOSIS — I83019 Varicose veins of right lower extremity with ulcer of unspecified site: Secondary | ICD-10-CM

## 2023-11-27 ENCOUNTER — Ambulatory Visit (HOSPITAL_COMMUNITY)
Admission: RE | Admit: 2023-11-27 | Discharge: 2023-11-27 | Disposition: A | Discharge status: Home / Self Care | Source: Ambulatory Visit | Attending: Surgery | Admitting: Surgery

## 2023-11-27 ENCOUNTER — Encounter: Admitting: Physical Therapy

## 2023-11-27 DIAGNOSIS — L97919 Non-pressure chronic ulcer of unspecified part of right lower leg with unspecified severity: Secondary | ICD-10-CM | POA: Insufficient documentation

## 2023-11-27 DIAGNOSIS — I83019 Varicose veins of right lower extremity with ulcer of unspecified site: Secondary | ICD-10-CM | POA: Diagnosis not present

## 2023-12-01 ENCOUNTER — Encounter: Admitting: Physical Therapy

## 2023-12-12 ENCOUNTER — Encounter: Admitting: *Deleted

## 2023-12-19 ENCOUNTER — Encounter: Payer: Self-pay | Admitting: Family Medicine

## 2023-12-19 ENCOUNTER — Ambulatory Visit: Admitting: Physician Assistant

## 2023-12-19 VITALS — BP 157/93 | HR 60 | Ht 72.0 in | Wt 197.0 lb

## 2023-12-19 DIAGNOSIS — I872 Venous insufficiency (chronic) (peripheral): Secondary | ICD-10-CM

## 2023-12-19 DIAGNOSIS — L97919 Non-pressure chronic ulcer of unspecified part of right lower leg with unspecified severity: Secondary | ICD-10-CM | POA: Diagnosis not present

## 2023-12-19 DIAGNOSIS — I83019 Varicose veins of right lower extremity with ulcer of unspecified site: Secondary | ICD-10-CM | POA: Diagnosis not present

## 2023-12-19 NOTE — Progress Notes (Addendum)
 Office Note     CC:  follow up Requesting Provider:  Edman Meade PEDLAR, FNP  HPI: Nicholas Yates is a 68 y.o. (29-Aug-1955) male who presents for evaluation of a venous ulceration on his right lower leg.  He denies any history of DVT, trauma, or prior vascular interventions.  He states the current ulcer has been present for almost a year.  He was recently referred to the wound clinic however has not yet received a call to make an appointment.  He wore compression socks in the past however has fallen out of habit.  He has not been focusing on leg elevation during the day.  He denies tobacco use.   Past Medical History:  Diagnosis Date   Anemia    See notes from [facility] for duodenal ulcer   Arthritis    Back   Depression    GERD (gastroesophageal reflux disease)    had duodenal ulcer 5 months   Post traumatic stress disorder    Substance abuse (HCC) 40 years ago   Several stints at rehab. Request referral for one on one counseling with psychologist   Ulcer    See [facility] records    Past Surgical History:  Procedure Laterality Date   BIOPSY  02/13/2022   Procedure: BIOPSY;  Surgeon: Wilhelmenia Aloha Raddle., MD;  Location: [facility];  Service: Gastroenterology;;   ESOPHAGOGASTRODUODENOSCOPY (EGD) WITH PROPOFOL  N/A 02/13/2022   Procedure: ESOPHAGOGASTRODUODENOSCOPY (EGD) WITH PROPOFOL ;  Surgeon: Wilhelmenia Aloha Raddle., MD;  Location: [facility];  Service: Gastroenterology;  Laterality: N/A;   HERNIA REPAIR     JOINT REPLACEMENT     See [facility] records   SPINE SURGERY     Injection of bone cement   TOTAL HIP ARTHROPLASTY     bilateral    Social History   Socioeconomic History   Marital status: Single    Spouse name: Not on file   Number of children: Not on file   Years of education: Not on file   Highest education level: 9th grade  Occupational History   Not on file  Tobacco Use   Smoking status: Never   Smokeless tobacco: Never  Vaping Use    Vaping status: Never Used  Substance and Sexual Activity   Alcohol use: Not Currently    Alcohol/week: 42.0 standard drinks of alcohol    Types: 42 Cans of beer per week    Comment: daily   Drug use: Not Currently    Types: Marijuana   Sexual activity: Yes  Other Topics Concern   Not on file  Social History Narrative   Not on file   Social Drivers of Health   Financial Resource Strain: Low Risk  (10/04/2023)   Overall Financial Resource Strain (CARDIA)    Difficulty of Paying Living Expenses: Not very hard  Food Insecurity: No Food Insecurity (10/04/2023)   Hunger Vital Sign    Worried About Running Out of Food in the Last Year: Never true    Ran Out of Food in the Last Year: Never true  Transportation Needs: No Transportation Needs (10/04/2023)   PRAPARE - Administrator, Civil Service (Medical): No    Lack of Transportation (Non-Medical): No  Physical Activity: Sufficiently Active (10/04/2023)   Exercise Vital Sign    Days of Exercise per Week: 4 days    Minutes of Exercise per Session: 90 min  Stress: Stress Concern Present (10/04/2023)   [employer] of Occupational Health - Occupational Stress Questionnaire  Feeling of Stress: Very much  Social Connections: Unknown (10/04/2023)   Social Connection and Isolation Panel    Frequency of Communication with Friends and Family: Patient declined    Frequency of Social Gatherings with Friends and Family: Patient declined    Attends Religious Services: Patient declined    Database administrator or Organizations: Patient declined    Attends Banker Meetings: Not on file    Marital Status: Living with partner  Intimate Partner Violence: Not At Risk (07/31/2022)   Received from [facility]   HITS    Over the last 12 months how often did your partner physically hurt you?: Never    Over the last 12 months how often did your partner insult you or talk down to you?: Never    Over the last 12 months how  often did your partner threaten you with physical harm?: Never    Over the last 12 months how often did your partner scream or curse at you?: Never    Family History  Problem Relation Age of Onset   Anxiety disorder Mother    Depression Mother    Heart disease Father    Varicose Veins Father    Drug abuse Brother    Colon cancer Neg Hx    Esophageal cancer Neg Hx    Inflammatory bowel disease Neg Hx    Liver disease Neg Hx    Pancreatic cancer Neg Hx    Rectal cancer Neg Hx    Stomach cancer Neg Hx     Current Outpatient Medications  Medication Sig Dispense Refill   mupirocin  ointment (BACTROBAN ) 2 % Apply 1 Application topically 2 (two) times daily. 22 g 0   omeprazole  (PRILOSEC) 20 MG capsule Take 1 capsule (20 mg total) by mouth daily. (Patient not taking: Reported on 12/19/2023) 30 capsule 0   No current facility-administered medications for this visit.    No Known Allergies   REVIEW OF SYSTEMS:   [X]  denotes positive finding, [ ]  denotes negative finding Cardiac  Comments:  Chest pain or chest pressure:    Shortness of breath upon exertion:    Short of breath when lying flat:    Irregular heart rhythm:        Vascular    Pain in calf, thigh, or hip brought on by ambulation:    Pain in feet at night that wakes you up from your sleep:     Blood clot in your veins:    Leg swelling:         Pulmonary    Oxygen at home:    Productive cough:     Wheezing:         Neurologic    Sudden weakness in arms or legs:     Sudden numbness in arms or legs:     Sudden onset of difficulty speaking or slurred speech:    Temporary loss of vision in one eye:     Problems with dizziness:         Gastrointestinal    Blood in stool:     Vomited blood:         Genitourinary    Burning when urinating:     Blood in urine:        Psychiatric    Major depression:         Hematologic    Bleeding problems:    Problems with blood clotting too easily:        Skin  Rashes  or ulcers:        Constitutional    Fever or chills:      PHYSICAL EXAMINATION:  Vitals:   12/19/23 1302  BP: (!) 157/93  Pulse: 60  SpO2: 98%  Weight: 197 lb (89.4 kg)  Height: 6' (1.829 m)    General:  WDWN in NAD; vital signs documented above Gait: Not observed HENT: WNL, normocephalic Pulmonary: normal non-labored breathing Cardiac: regular HR Abdomen: soft, NT, no masses Skin: without rashes Vascular Exam/Pulses: Palpable right DP pulse Extremities: 3 x 3 cm round tibial ulceration with surrounding erythema; some granulation tissue in the wound bed; ropey varicosities of the medial right lower leg close to the knee Musculoskeletal: no muscle wasting or atrophy  Neurologic: A&O X 3 Psychiatric:  The pt has Normal affect.   Non-Invasive Vascular Imaging:   Right lower extremity venous reflux study negative for DVT Negative for deep venous reflux Incompetent GSV from the mid thigh to the proximal calf    ASSESSMENT/PLAN:: 68 y.o. male here for evaluation of venous ulceration of the right lower leg  Mr. Luedke is a 68 year old male who was referred to the office for evaluation of a venous ulceration of his right lower leg.  Wound has been present for almost a year without much healing progress.  Right lower extremity venous reflux study negative for DVT.  He does have reflux in his GSV from his mid thigh to his proximal calf however no reflux at the saphenofemoral junction.  On exam he has ropey varicosities along the right medial lower leg.  Fortunately there is no evidence of arterial insufficiency.  He has a 2+ palpable DP pulse.  Plan will be to place an Radio broadcast assistant today.  We will also place a referral to the wound clinic.  He will be scheduled for weekly Unna boot changes until he establishes care with the wound clinic.  I have also recommended periodic leg elevation throughout the day, especially in the afternoon and evening.  He will notify the office with any questions  or concerns.  ADDENDUM: This patient has obvious venous insufficiency on exam.  He has a venous ulceration as well as ropey varicosities of the medial right lower leg close to the knee.  In addition to [employer] placement he will wrap from his knee to his thigh with ace wrap to provide thigh-high compression.  Despite a competent saphenofemoral junction and proximal greater saphenous vein he will come to our [place] office in 3 to 6 months to be evaluated by Dr. Serene or Dr. Sheree to see if he would be a candidate for laser ablation therapy and stab phlebectomy.  Donnice Sender, PA-C [facility] [phone]

## 2023-12-20 ENCOUNTER — Other Ambulatory Visit: Payer: Self-pay | Admitting: Family Medicine

## 2023-12-20 DIAGNOSIS — K219 Gastro-esophageal reflux disease without esophagitis: Secondary | ICD-10-CM

## 2023-12-20 MED ORDER — OMEPRAZOLE 20 MG PO CPDR: 20.0000 mg | DELAYED_RELEASE_CAPSULE | Freq: Every day | ORAL | 0 refills | Status: AC

## 2023-12-26 ENCOUNTER — Encounter (HOSPITAL_BASED_OUTPATIENT_CLINIC_OR_DEPARTMENT_OTHER): Attending: General Surgery | Admitting: General Surgery

## 2023-12-26 DIAGNOSIS — I872 Venous insufficiency (chronic) (peripheral): Secondary | ICD-10-CM | POA: Diagnosis not present

## 2023-12-26 DIAGNOSIS — L97812 Non-pressure chronic ulcer of other part of right lower leg with fat layer exposed: Secondary | ICD-10-CM | POA: Diagnosis not present

## 2024-01-02 ENCOUNTER — Encounter (HOSPITAL_BASED_OUTPATIENT_CLINIC_OR_DEPARTMENT_OTHER): Admitting: General Surgery

## 2024-01-02 ENCOUNTER — Ambulatory Visit: Admitting: Vascular Surgery

## 2024-01-09 ENCOUNTER — Ambulatory Visit: Admitting: Vascular Surgery

## 2024-01-09 ENCOUNTER — Encounter (HOSPITAL_BASED_OUTPATIENT_CLINIC_OR_DEPARTMENT_OTHER): Admitting: General Surgery

## 2024-01-09 DIAGNOSIS — I872 Venous insufficiency (chronic) (peripheral): Secondary | ICD-10-CM | POA: Diagnosis not present

## 2024-01-09 DIAGNOSIS — L97812 Non-pressure chronic ulcer of other part of right lower leg with fat layer exposed: Secondary | ICD-10-CM | POA: Diagnosis not present

## 2024-01-16 ENCOUNTER — Encounter (HOSPITAL_BASED_OUTPATIENT_CLINIC_OR_DEPARTMENT_OTHER): Admitting: General Surgery

## 2024-01-16 ENCOUNTER — Ambulatory Visit

## 2024-01-16 DIAGNOSIS — I872 Venous insufficiency (chronic) (peripheral): Secondary | ICD-10-CM | POA: Diagnosis not present

## 2024-01-16 DIAGNOSIS — L97812 Non-pressure chronic ulcer of other part of right lower leg with fat layer exposed: Secondary | ICD-10-CM | POA: Diagnosis not present

## 2024-01-17 NOTE — Progress Notes (Signed)
 Earlie Schank                                          MRN: [id number]   01/17/2024   The [facility] Quality Team Specialist reviewed this patient medical record for the purposes of chart review for care gap closure. The following were reviewed: abstraction for care gap closure-colorectal cancer screening.    [facility]I Quality Team

## 2024-01-24 ENCOUNTER — Encounter (HOSPITAL_BASED_OUTPATIENT_CLINIC_OR_DEPARTMENT_OTHER): Attending: General Surgery | Admitting: General Surgery

## 2024-01-24 DIAGNOSIS — L97812 Non-pressure chronic ulcer of other part of right lower leg with fat layer exposed: Secondary | ICD-10-CM | POA: Diagnosis present

## 2024-01-24 DIAGNOSIS — I872 Venous insufficiency (chronic) (peripheral): Secondary | ICD-10-CM | POA: Diagnosis not present

## 2024-01-31 ENCOUNTER — Encounter (HOSPITAL_BASED_OUTPATIENT_CLINIC_OR_DEPARTMENT_OTHER): Admitting: General Surgery

## 2024-01-31 DIAGNOSIS — I872 Venous insufficiency (chronic) (peripheral): Secondary | ICD-10-CM | POA: Diagnosis not present

## 2024-02-08 ENCOUNTER — Encounter (HOSPITAL_BASED_OUTPATIENT_CLINIC_OR_DEPARTMENT_OTHER): Admitting: General Surgery

## 2024-02-08 DIAGNOSIS — I872 Venous insufficiency (chronic) (peripheral): Secondary | ICD-10-CM | POA: Diagnosis not present

## 2024-02-09 ENCOUNTER — Encounter: Payer: Self-pay | Admitting: *Deleted

## 2024-02-09 NOTE — Progress Notes (Signed)
 Nicholas Yates                                          MRN: [id number]   02/09/2024   The [facility] Quality Team Specialist reviewed this patient medical record for the purposes of chart review for care gap closure. The following were reviewed: abstraction for care gap closure-colorectal cancer screening.    [facility]I Quality Team

## 2024-02-13 ENCOUNTER — Encounter (HOSPITAL_BASED_OUTPATIENT_CLINIC_OR_DEPARTMENT_OTHER): Admitting: General Surgery

## 2024-02-13 DIAGNOSIS — I872 Venous insufficiency (chronic) (peripheral): Secondary | ICD-10-CM | POA: Diagnosis not present

## 2024-02-22 ENCOUNTER — Encounter (HOSPITAL_BASED_OUTPATIENT_CLINIC_OR_DEPARTMENT_OTHER): Attending: General Surgery | Admitting: General Surgery

## 2024-02-22 DIAGNOSIS — L97812 Non-pressure chronic ulcer of other part of right lower leg with fat layer exposed: Secondary | ICD-10-CM | POA: Insufficient documentation

## 2024-02-22 DIAGNOSIS — I872 Venous insufficiency (chronic) (peripheral): Secondary | ICD-10-CM | POA: Diagnosis not present

## 2024-02-29 ENCOUNTER — Encounter (HOSPITAL_BASED_OUTPATIENT_CLINIC_OR_DEPARTMENT_OTHER): Admitting: Internal Medicine

## 2024-02-29 DIAGNOSIS — L97812 Non-pressure chronic ulcer of other part of right lower leg with fat layer exposed: Secondary | ICD-10-CM | POA: Diagnosis not present

## 2024-03-05 ENCOUNTER — Ambulatory Visit

## 2024-03-11 ENCOUNTER — Encounter (HOSPITAL_BASED_OUTPATIENT_CLINIC_OR_DEPARTMENT_OTHER): Admitting: General Surgery

## 2024-03-11 DIAGNOSIS — L97812 Non-pressure chronic ulcer of other part of right lower leg with fat layer exposed: Secondary | ICD-10-CM | POA: Diagnosis not present

## 2024-03-25 ENCOUNTER — Ambulatory Visit: Attending: Surgery | Admitting: Surgery

## 2024-03-25 ENCOUNTER — Encounter: Payer: Self-pay | Admitting: Surgery

## 2024-03-25 VITALS — BP 133/83 | HR 69 | Temp 98.2°F | Ht 72.0 in | Wt 201.0 lb

## 2024-03-25 DIAGNOSIS — I83891 Varicose veins of right lower extremities with other complications: Secondary | ICD-10-CM | POA: Diagnosis not present

## 2024-03-25 NOTE — Progress Notes (Signed)
 "                                    [facility]  Patient name: Nicholas Yates MRN: [id number] DOB: [birth year] Sex: male   REASON FOR VISIT:    Follow up  HISOTRY OF PRESENT ILLNESS:    Mehar Kirkwood is a 69 y.o. male who is back today for follow-up of his venous ulcer on the right lower leg.  He does not have any history of DVT or trauma or prior vascular interventions.  The ulcer has been present for almost a year and has been treating at the wound clinic.  He has been wearing compression socks and Unna boots   PAST MEDICAL HISTORY:   Past Medical History:  Diagnosis Date   Anemia    See notes from [facility] for duodenal ulcer   Arthritis    Back   Depression    GERD (gastroesophageal reflux disease)    had duodenal ulcer 5 months   Post traumatic stress disorder    Substance abuse (HCC) 40 years ago   Several stints at rehab. Request referral for one on one counseling with psychologist   Ulcer    See [facility] records     FAMILY HISTORY:   Family History  Problem Relation Age of Onset   Anxiety disorder Mother    Depression Mother    Heart disease Father    Varicose Veins Father    Drug abuse Brother    Colon cancer Neg Hx    Esophageal cancer Neg Hx    Inflammatory bowel disease Neg Hx    Liver disease Neg Hx    Pancreatic cancer Neg Hx    Rectal cancer Neg Hx    Stomach cancer Neg Hx     SOCIAL HISTORY:   Social History   Tobacco Use   Smoking status: Never   Smokeless tobacco: Never  Substance Use Topics   Alcohol use: Not Currently    Alcohol/week: 42.0 standard drinks of alcohol    Types: 42 Cans of beer per week    Comment: daily     ALLERGIES:   Allergies[1]   CURRENT MEDICATIONS:   Current Outpatient Medications  Medication Sig Dispense Refill   mupirocin  ointment (BACTROBAN ) 2 % Apply 1 Application topically 2 (two) times daily. 22 g 0   omeprazole  (PRILOSEC) 20 MG capsule Take 1 capsule (20 mg  total) by mouth daily. 30 capsule 0   No current facility-administered medications for this visit.    REVIEW OF SYSTEMS:   [X]  denotes positive finding, [ ]  denotes negative finding Cardiac  Comments:  Chest pain or chest pressure:    Shortness of breath upon exertion:    Short of breath when lying flat:    Irregular heart rhythm:        Vascular    Pain in calf, thigh, or hip brought on by ambulation:    Pain in feet at night that wakes you up from your sleep:     Blood clot in your veins:    Leg swelling:         Pulmonary    Oxygen at home:    Productive cough:     Wheezing:         Neurologic    Sudden weakness in arms or legs:     Sudden numbness in arms or legs:  Sudden onset of difficulty speaking or slurred speech:    Temporary loss of vision in one eye:     Problems with dizziness:         Gastrointestinal    Blood in stool:     Vomited blood:         Genitourinary    Burning when urinating:     Blood in urine:        Psychiatric    Major depression:         Hematologic    Bleeding problems:    Problems with blood clotting too easily:        Skin    Rashes or ulcers:        Constitutional    Fever or chills:      PHYSICAL EXAM:   Vitals:   03/25/24 1429  BP: 133/83  Pulse: 69  Temp: 98.2 F (36.8 C)  SpO2: 97%  Weight: 201 lb (91.2 kg)  Height: 6' (1.829 m)    GENERAL: The patient is a well-nourished male, in no acute distress. The vital signs are documented above. CARDIAC: There is a regular rate and rhythm.  VASCULAR: Palpable pedal pulses.  SonoSite was used to evaluate the saphenous vein which was markedly dilated much more so than ultrasound.  I measured it at 6.5 mm PULMONARY: Non-labored respirations ABDOMEN: Soft and non-tender with normal pitched bowel sounds.  MUSCULOSKELETAL: There are no major deformities or cyanosis. NEUROLOGIC: No focal weakness or paresthesias are detected. SKIN: There are no ulcers or rashes  noted. PSYCHIATRIC: The patient has a normal affect.     STUDIES:   I have reviewed the following: Venous Reflux Times  +--------------+---------+------+-----------+------------+--------+  RIGHT        Reflux NoRefluxReflux TimeDiameter cmsComments                          Yes                                   +--------------+---------+------+-----------+------------+--------+  CFV          no                                              +--------------+---------+------+-----------+------------+--------+  FV prox       no                                              +--------------+---------+------+-----------+------------+--------+  FV mid        no                                              +--------------+---------+------+-----------+------------+--------+  FV dist       no                                              +--------------+---------+------+-----------+------------+--------+  Popliteal    no                                              +--------------+---------+------+-----------+------------+--------+  GSV at [facility]    no                            0.47              +--------------+---------+------+-----------+------------+--------+  GSV prox thighno                            0.48              +--------------+---------+------+-----------+------------+--------+  GSV mid thigh           yes    >500 ms      0.50              +--------------+---------+------+-----------+------------+--------+  GSV dist thigh          yes    >500 ms      0.43              +--------------+---------+------+-----------+------------+--------+  GSV at knee             yes    >500 ms      0.48              +--------------+---------+------+-----------+------------+--------+  GSV prox calf           yes    >500 ms      0.19              +--------------+---------+------+-----------+------------+--------+   GSV mid calf  no                            0.14              +--------------+---------+------+-----------+------------+--------+  GSV dist calf no                            0.17              +--------------+---------+------+-----------+------------+--------+  SSV at [facility]    no                            0.77              +--------------+---------+------+-----------+------------+--------+  SSV prox calf no                            0.37              +--------------+---------+------+-----------+------------+--------+  SSV mid calf  no                            0.19              +--------------+---------+------+-----------+------------+--------+       MEDICAL ISSUES:   CEAP Class VI, right leg : I discussed proceeding with endovenous laser ablation of the right saphenous vein to address his venous insufficiency and his large varicosities.  He has hyperpigmentation of the ankle as well as an ulcer.  There are numerous varicosities in the medial calf.  When I looked at the vein with sono site I measured it at 6.5 mm in the saphenous vein from the knee up to the saphenofemoral junction.  I recommended proceeding with endovenous laser ablation of the right great saphenous vein with greater than  20 stabs to address his varicosities    Malvina New, IV, MD, FACS [facility] [phone] Pager [phone]      [1] No Known Allergies  "

## 2024-04-05 ENCOUNTER — Other Ambulatory Visit: Payer: Self-pay

## 2024-04-05 DIAGNOSIS — I83019 Varicose veins of right lower extremity with ulcer of unspecified site: Secondary | ICD-10-CM

## 2024-04-05 DIAGNOSIS — I83891 Varicose veins of right lower extremities with other complications: Secondary | ICD-10-CM

## 2024-04-05 DIAGNOSIS — I872 Venous insufficiency (chronic) (peripheral): Secondary | ICD-10-CM

## 2024-05-30 ENCOUNTER — Other Ambulatory Visit: Admitting: Surgery

## 2024-06-13 ENCOUNTER — Encounter: Admitting: Surgery

## 2024-06-13 ENCOUNTER — Ambulatory Visit (HOSPITAL_COMMUNITY)
